# Patient Record
Sex: Female | Born: 1970
Health system: Southern US, Community
[De-identification: ages and names within clinical notes are randomized; demographics above are authoritative.]

## PROBLEM LIST (undated history)

## (undated) DIAGNOSIS — T7840XA Allergy, unspecified, initial encounter: Secondary | ICD-10-CM

## (undated) DIAGNOSIS — I10 Essential (primary) hypertension: Secondary | ICD-10-CM

## (undated) DIAGNOSIS — Z9889 Other specified postprocedural states: Secondary | ICD-10-CM

## (undated) DIAGNOSIS — Z87448 Personal history of other diseases of urinary system: Secondary | ICD-10-CM

## (undated) DIAGNOSIS — R7989 Other specified abnormal findings of blood chemistry: Secondary | ICD-10-CM

## (undated) DIAGNOSIS — F419 Anxiety disorder, unspecified: Secondary | ICD-10-CM

## (undated) DIAGNOSIS — R112 Nausea with vomiting, unspecified: Secondary | ICD-10-CM

## (undated) DIAGNOSIS — R109 Unspecified abdominal pain: Secondary | ICD-10-CM

## (undated) HISTORY — DX: Anxiety disorder, unspecified: F41.9

## (undated) HISTORY — PX: DILATION AND EVACUATION: SHX1459

## (undated) HISTORY — PX: TUBAL LIGATION: SHX77

## (undated) HISTORY — DX: Allergy, unspecified, initial encounter: T78.40XA

## (undated) HISTORY — PX: ABDOMINAL HYSTERECTOMY: SHX81

---
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
## 2008-11-15 ENCOUNTER — Emergency Department (HOSPITAL_COMMUNITY): Admission: EM | Admit: 2008-11-15 | Discharge: 2008-11-15 | Payer: Self-pay | Admitting: Family Medicine

## 2009-03-21 ENCOUNTER — Emergency Department (HOSPITAL_COMMUNITY): Admission: EM | Admit: 2009-03-21 | Discharge: 2009-03-21 | Payer: Self-pay | Admitting: Emergency Medicine

## 2009-07-20 ENCOUNTER — Emergency Department (HOSPITAL_COMMUNITY): Admission: EM | Admit: 2009-07-20 | Discharge: 2009-07-20 | Payer: Self-pay | Admitting: Emergency Medicine

## 2009-09-03 ENCOUNTER — Encounter: Admission: RE | Admit: 2009-09-03 | Discharge: 2009-09-03 | Payer: Self-pay | Admitting: Nephrology

## 2010-02-03 ENCOUNTER — Emergency Department (HOSPITAL_COMMUNITY): Admission: EM | Admit: 2010-02-03 | Discharge: 2010-02-03 | Payer: Self-pay | Admitting: Family Medicine

## 2010-08-12 LAB — CULTURE, ROUTINE-ABSCESS

## 2012-03-16 ENCOUNTER — Emergency Department (HOSPITAL_COMMUNITY): Payer: Self-pay

## 2012-03-16 ENCOUNTER — Encounter (HOSPITAL_COMMUNITY): Payer: Self-pay | Admitting: Emergency Medicine

## 2012-03-16 ENCOUNTER — Emergency Department (HOSPITAL_COMMUNITY)
Admission: EM | Admit: 2012-03-16 | Discharge: 2012-03-17 | Disposition: A | Discharge status: Home / Self Care | Payer: Self-pay | Attending: Emergency Medicine | Admitting: Emergency Medicine

## 2012-03-16 DIAGNOSIS — N949 Unspecified condition associated with female genital organs and menstrual cycle: Secondary | ICD-10-CM | POA: Insufficient documentation

## 2012-03-16 DIAGNOSIS — R102 Pelvic and perineal pain: Secondary | ICD-10-CM

## 2012-03-16 DIAGNOSIS — D259 Leiomyoma of uterus, unspecified: Secondary | ICD-10-CM | POA: Insufficient documentation

## 2012-03-16 DIAGNOSIS — I10 Essential (primary) hypertension: Secondary | ICD-10-CM | POA: Insufficient documentation

## 2012-03-16 DIAGNOSIS — D219 Benign neoplasm of connective and other soft tissue, unspecified: Secondary | ICD-10-CM

## 2012-03-16 HISTORY — DX: Essential (primary) hypertension: I10

## 2012-03-16 LAB — URINALYSIS, MICROSCOPIC ONLY
Hgb urine dipstick: NEGATIVE
Leukocytes, UA: NEGATIVE
Nitrite: NEGATIVE

## 2012-03-16 LAB — CBC WITH DIFFERENTIAL/PLATELET
Basophils Absolute: 0 10*3/uL (ref 0.0–0.1)
Eosinophils Absolute: 0.1 10*3/uL (ref 0.0–0.7)
Lymphocytes Relative: 26 % (ref 12–46)
Lymphs Abs: 2.4 10*3/uL (ref 0.7–4.0)
MCH: 29.2 pg (ref 26.0–34.0)
Monocytes Absolute: 0.6 10*3/uL (ref 0.1–1.0)
Monocytes Relative: 6 % (ref 3–12)
Neutro Abs: 6.2 10*3/uL (ref 1.7–7.7)
Neutrophils Relative %: 67 % (ref 43–77)
WBC: 9.2 10*3/uL (ref 4.0–10.5)

## 2012-03-16 LAB — COMPREHENSIVE METABOLIC PANEL
ALT: 15 U/L (ref 0–35)
AST: 18 U/L (ref 0–37)
Albumin: 3.5 g/dL (ref 3.5–5.2)
GFR calc Af Amer: 74 mL/min — ABNORMAL LOW (ref >90)
GFR calc non Af Amer: 64 mL/min — ABNORMAL LOW (ref >90)
Potassium: 3.6 mEq/L (ref 3.5–5.1)
Total Bilirubin: 0.2 mg/dL — ABNORMAL LOW (ref 0.3–1.2)

## 2012-03-16 LAB — LIPASE, BLOOD: Lipase: 38 U/L (ref 11–59)

## 2012-03-16 MED ORDER — HYDROMORPHONE HCL PF 1 MG/ML IJ SOLN
0.5000 mg | Freq: Once | INTRAMUSCULAR | Status: AC
  Administered 2012-03-17: 0.5 mg via INTRAVENOUS
  Filled 2012-03-16: qty 1

## 2012-03-16 MED ORDER — ONDANSETRON HCL 4 MG/2ML IJ SOLN
4.0000 mg | Freq: Once | INTRAMUSCULAR | Status: AC
  Administered 2012-03-17: 4 mg via INTRAVENOUS
  Filled 2012-03-16: qty 2

## 2012-03-16 MED ORDER — IOHEXOL 300 MG/ML  SOLN
20.0000 mL | INTRAMUSCULAR | Status: AC
  Administered 2012-03-16: 20 mL via ORAL

## 2012-03-16 NOTE — ED Notes (Signed)
Lab at bedside

## 2012-03-16 NOTE — ED Notes (Signed)
C/o R sided abd pain, nausea, and pressure with urination since this morning.  Pt states it feels like something is moving in R side of abd with intermittent swelling to R side of abd since this morning.  Last BM this AM- normal.

## 2012-03-16 NOTE — ED Notes (Signed)
Pt A.O. X 4. Complains of abdominal pain x 1 day. Tender on palpation in RUQ and RUL. Denies injury. Reports nausea x 1 day. No emesis. Denies recent diet or medication changes. Denies any other pain. NAD.

## 2012-03-16 NOTE — ED Provider Notes (Signed)
History     CSN: [id number]  Arrival date & time 03/16/12  4098   First MD Initiated Contact with Abigail Beasley 03/16/12 2103      Chief Complaint  Abigail Beasley presents with  . Abdominal Pain    (Consider location/radiation/quality/duration/timing/severity/associated sxs/prior treatment) Abigail Beasley is a 41 y.o. female presenting with abdominal pain. The history is provided by the Abigail Beasley.  Abdominal Pain The primary symptoms of the illness include abdominal pain, nausea, dysuria and vaginal discharge. The primary symptoms of the illness do not include fever, shortness of breath or vomiting. The current episode started 6 to 12 hours ago. The onset of the illness was gradual.  The vaginal discharge is associated with dysuria.   Associated symptoms comments: Right sided abdominal pain since this morning. She feels the area in the lower abdomen swells and the pain is waxing and waning. She has had nausea without vomiting. No known fever. She has had no change in her bowel movements. She reports a small amount of vaginal discharge. .    Past Medical History  Diagnosis Date  . Hypertension     History reviewed. No pertinent past surgical history.  History reviewed. No pertinent family history.  History  Substance Use Topics  . Smoking status: Never Smoker   . Smokeless tobacco: Not on file  . Alcohol Use: No    OB History    Grav Para Term Preterm Abortions TAB SAB Ect Mult Living                  Review of Systems  Constitutional: Negative for fever.  Respiratory: Negative for shortness of breath.   Cardiovascular: Negative for chest pain.  Gastrointestinal: Positive for nausea and abdominal pain. Negative for vomiting.  Genitourinary: Positive for dysuria and vaginal discharge.    Allergies  Review of Abigail Beasley's allergies indicates no known allergies.  Home Medications   Current Outpatient Rx  Name Route Sig Dispense Refill  . PRESCRIPTION MEDICATION Oral Take 1 tablet by  mouth daily. Blood pressure medication.      BP 151/89  Pulse 86  Temp 98.1 F (36.7 C) (Oral)  Resp 18  SpO2 97%  LMP 03/07/2012  Physical Exam  Constitutional: She is oriented to person, place, and time. She appears well-developed and well-nourished.  HENT:  Head: Normocephalic.  Neck: Normal range of motion. Neck supple.  Cardiovascular: Normal rate and regular rhythm.   Pulmonary/Chest: Effort normal and breath sounds normal.  Abdominal: Soft. Bowel sounds are normal. There is no rebound and no guarding.       RLQ abdominal tenderness. No rebound or guarding.   Genitourinary: No vaginal discharge found.       Mildly tender uterus and right adnexa without mass.   Musculoskeletal: Normal range of motion.  Neurological: She is alert and oriented to person, place, and time.  Skin: Skin is warm and dry. No rash noted.  Psychiatric: She has a normal mood and affect.    ED Course  Procedures (including critical care time)  Labs Reviewed  COMPREHENSIVE METABOLIC PANEL - Abnormal; Notable for the following:    Glucose, Bld 112 (*)     Total Bilirubin 0.2 (*)     GFR calc non Af Amer 64 (*)     GFR calc Af Amer 74 (*)     All other components within normal limits  URINALYSIS, MICROSCOPIC ONLY - Abnormal; Notable for the following:    APPearance CLOUDY (*)     Specific Gravity, Urine  1.035 (*)     Squamous Epithelial / LPF MANY (*)     All other components within normal limits  CBC WITH DIFFERENTIAL  LIPASE, BLOOD  POCT PREGNANCY, URINE   Results for orders placed during the hospital encounter of 03/16/12  CBC WITH DIFFERENTIAL      Component Value Range   WBC 9.2  4.0 - 10.5 K/uL   RBC 4.86  3.87 - 5.11 MIL/uL   Hemoglobin 14.2  12.0 - 15.0 g/dL   HCT 84.6  96.2 - 95.2 %   MCV 88.3  78.0 - 100.0 fL   MCH 29.2  26.0 - 34.0 pg   MCHC 33.1  30.0 - 36.0 g/dL   RDW 84.1  32.4 - 40.1 %   Platelets 269  150 - 400 K/uL   Neutrophils Relative 67  43 - 77 %   Neutro Abs  6.2  1.7 - 7.7 K/uL   Lymphocytes Relative 26  12 - 46 %   Lymphs Abs 2.4  0.7 - 4.0 K/uL   Monocytes Relative 6  3 - 12 %   Monocytes Absolute 0.6  0.1 - 1.0 K/uL   Eosinophils Relative 1  0 - 5 %   Eosinophils Absolute 0.1  0.0 - 0.7 K/uL   Basophils Relative 0  0 - 1 %   Basophils Absolute 0.0  0.0 - 0.1 K/uL  COMPREHENSIVE METABOLIC PANEL      Component Value Range   Sodium 141  135 - 145 mEq/L   Potassium 3.6  3.5 - 5.1 mEq/L   Chloride 104  96 - 112 mEq/L   CO2 27  19 - 32 mEq/L   Glucose, Bld 112 (*) 70 - 99 mg/dL   BUN 11  6 - 23 mg/dL   Creatinine, Ser 0.27  0.50 - 1.10 mg/dL   Calcium 9.7  8.4 - 25.3 mg/dL   Total Protein 7.5  6.0 - 8.3 g/dL   Albumin 3.5  3.5 - 5.2 g/dL   AST 18  0 - 37 U/L   ALT 15  0 - 35 U/L   Alkaline Phosphatase 78  39 - 117 U/L   Total Bilirubin 0.2 (*) 0.3 - 1.2 mg/dL   GFR calc non Af Amer 64 (*) >90 mL/min   GFR calc Af Amer 74 (*) >90 mL/min  LIPASE, BLOOD      Component Value Range   Lipase 38  11 - 59 U/L  URINALYSIS, MICROSCOPIC ONLY      Component Value Range   Color, Urine YELLOW  YELLOW   APPearance CLOUDY (*) CLEAR   Specific Gravity, Urine 1.035 (*) 1.005 - 1.030   pH 5.5  5.0 - 8.0   Glucose, UA NEGATIVE  NEGATIVE mg/dL   Hgb urine dipstick NEGATIVE  NEGATIVE   Bilirubin Urine NEGATIVE  NEGATIVE   Ketones, ur NEGATIVE  NEGATIVE mg/dL   Protein, ur NEGATIVE  NEGATIVE mg/dL   Urobilinogen, UA 1.0  0.0 - 1.0 mg/dL   Nitrite NEGATIVE  NEGATIVE   Leukocytes, UA NEGATIVE  NEGATIVE   WBC, UA 0-2  <3 WBC/hpf   RBC / HPF 0-2  <3 RBC/hpf   Bacteria, UA RARE  RARE   Squamous Epithelial / LPF MANY (*) RARE   Urine-Other MUCOUS PRESENT    POCT PREGNANCY, URINE      Component Value Range   Preg Test, Ur NEGATIVE  NEGATIVE   Ct Abdomen Pelvis W Contrast  03/17/2012  *RADIOLOGY REPORT*  Clinical Data: Right-sided abdominal pain, nausea  CT ABDOMEN AND PELVIS WITH CONTRAST  Technique:  Multidetector CT imaging of the abdomen and  pelvis was performed following the standard protocol during bolus administration of intravenous contrast.  Contrast: OMNIPAQUE IOHEXOL 300 MG/ML  SOLN  Comparison: None.  Findings: Mild linear scarring in the right lower lobe.  1.6 x 1.2 cm hypoenhancing lesion in the anterior liver (series 2/image 20), incompletely characterized, likely benign.  Spleen, pancreas, and adrenal glands are within normal limits.  Gallbladder is underdistended.  No intrahepatic or extrahepatic ductal dilatation.  Kidneys are within normal limits.  No hydronephrosis.  No evidence of bowel obstruction.  Normal appendix.  Uterus is heterogeneous and notable for a suspected fibroid in the anterior uterine body (sagittal image 90).  Bilateral ovaries are unremarkable.  Bladder is within normal limits.  Moderate fat-containing paraumbilical hernia.  Small fat-containing left inguinal hernia.  Visualized osseous structures are within normal limits.  IMPRESSION: No evidence of bowel obstruction.  Normal appendix.  Uterine fibroid.  No CT findings to account for the Abigail Beasley's abdominal pain.   Original Report Authenticated By: Charline Bills, M.D.    No results found.   No diagnosis found.  1. Pelvic pain 2. Uterine fibroids   MDM  CT scan negative for appendix, but positive for uterine fibroids. No fever, vomiting, diarrhea or other evidence of infection. Likely discomfort is due to fibroids. Will discharge home with GYN follow up.        Rodena Medin, PA-C 03/17/12 0110

## 2012-03-17 LAB — WET PREP, GENITAL
Trich, Wet Prep: NONE SEEN
WBC, Wet Prep HPF POC: NONE SEEN
Yeast Wet Prep HPF POC: NONE SEEN

## 2012-03-17 MED ORDER — HYDROCODONE-ACETAMINOPHEN 5-325 MG PO TABS: 1.0000 | ORAL_TABLET | ORAL | Status: DC | PRN

## 2012-03-17 MED ORDER — IBUPROFEN 800 MG PO TABS: 800.0000 mg | ORAL_TABLET | Freq: Three times a day (TID) | ORAL | Status: DC

## 2012-03-17 MED ORDER — OXYCODONE-ACETAMINOPHEN 5-325 MG PO TABS
1.0000 | ORAL_TABLET | Freq: Once | ORAL | Status: AC
  Administered 2012-03-17: 1 via ORAL
  Filled 2012-03-17: qty 1

## 2012-03-17 MED ORDER — IOHEXOL 300 MG/ML  SOLN
100.0000 mL | Freq: Once | INTRAMUSCULAR | Status: AC | PRN
  Administered 2012-03-17: 100 mL via INTRAVENOUS

## 2012-03-17 MED ORDER — PROMETHAZINE HCL 25 MG PO TABS: 25.0000 mg | ORAL_TABLET | Freq: Four times a day (QID) | ORAL | Status: DC | PRN

## 2012-03-17 NOTE — ED Notes (Signed)
Pt A.O. X 4. NAD. Vitals stable. Verbalized understanding of medication administration. Verbalized need to follow up with [facility] outpatient clinic. No further questions at this time.

## 2012-03-17 NOTE — ED Provider Notes (Signed)
Medical screening examination/treatment/procedure(s) were conducted as a shared visit with non-physician practitioner(s) and myself.  I personally evaluated the patient during the encounter  Patient is feeling better at this time.  Discharge home in good condition.  Nonspecific abdominal pain and nausea.  CT scan normal.  No tenderness in the right upper quadrant.  Pelvic per PA unremarkable.  Wet prep unremarkable  Close PCP followup   I personally reviewed the imaging tests through PACS system  I reviewed available ER/hospitalization records thought the EMR Ct Abdomen Pelvis W Contrast  03/17/2012  *RADIOLOGY REPORT*  Clinical Data: Right-sided abdominal pain, nausea  CT ABDOMEN AND PELVIS WITH CONTRAST  Technique:  Multidetector CT imaging of the abdomen and pelvis was performed following the standard protocol during bolus administration of intravenous contrast.  Contrast: OMNIPAQUE IOHEXOL 300 MG/ML  SOLN  Comparison: None.  Findings: Mild linear scarring in the right lower lobe.  1.6 x 1.2 cm hypoenhancing lesion in the anterior liver (series 2/image 20), incompletely characterized, likely benign.  Spleen, pancreas, and adrenal glands are within normal limits.  Gallbladder is underdistended.  No intrahepatic or extrahepatic ductal dilatation.  Kidneys are within normal limits.  No hydronephrosis.  No evidence of bowel obstruction.  Normal appendix.  Uterus is heterogeneous and notable for a suspected fibroid in the anterior uterine body (sagittal image 90).  Bilateral ovaries are unremarkable.  Bladder is within normal limits.  Moderate fat-containing paraumbilical hernia.  Small fat-containing left inguinal hernia.  Visualized osseous structures are within normal limits.  IMPRESSION: No evidence of bowel obstruction.  Normal appendix.  Uterine fibroid.  No CT findings to account for the patient's abdominal pain.   Original Report Authenticated By: Charline Bills, M.D.     Lyanne Co,  MD 03/17/12 [phone]

## 2012-03-19 LAB — GC/CHLAMYDIA PROBE AMP, GENITAL: Chlamydia, DNA Probe: NEGATIVE

## 2013-01-04 ENCOUNTER — Encounter (HOSPITAL_COMMUNITY): Payer: Self-pay | Admitting: Cardiology

## 2013-01-04 ENCOUNTER — Emergency Department (HOSPITAL_COMMUNITY)
Admission: EM | Admit: 2013-01-04 | Discharge: 2013-01-04 | Disposition: A | Discharge status: Home / Self Care | Payer: Self-pay | Attending: Emergency Medicine | Admitting: Emergency Medicine

## 2013-01-04 ENCOUNTER — Emergency Department (HOSPITAL_COMMUNITY): Payer: Self-pay

## 2013-01-04 DIAGNOSIS — M25559 Pain in unspecified hip: Secondary | ICD-10-CM | POA: Insufficient documentation

## 2013-01-04 DIAGNOSIS — M25552 Pain in left hip: Secondary | ICD-10-CM

## 2013-01-04 DIAGNOSIS — Z79899 Other long term (current) drug therapy: Secondary | ICD-10-CM | POA: Insufficient documentation

## 2013-01-04 DIAGNOSIS — R002 Palpitations: Secondary | ICD-10-CM | POA: Insufficient documentation

## 2013-01-04 DIAGNOSIS — R42 Dizziness and giddiness: Secondary | ICD-10-CM | POA: Insufficient documentation

## 2013-01-04 DIAGNOSIS — R52 Pain, unspecified: Secondary | ICD-10-CM | POA: Insufficient documentation

## 2013-01-04 DIAGNOSIS — R110 Nausea: Secondary | ICD-10-CM | POA: Insufficient documentation

## 2013-01-04 DIAGNOSIS — I10 Essential (primary) hypertension: Secondary | ICD-10-CM | POA: Insufficient documentation

## 2013-01-04 LAB — BASIC METABOLIC PANEL
CO2: 25 mEq/L (ref 19–32)
GFR calc Af Amer: 85 mL/min — ABNORMAL LOW (ref >90)
Glucose, Bld: 106 mg/dL — ABNORMAL HIGH (ref 70–99)
Sodium: 138 mEq/L (ref 135–145)

## 2013-01-04 LAB — CBC WITH DIFFERENTIAL/PLATELET
Basophils Absolute: 0 10*3/uL (ref 0.0–0.1)
Basophils Relative: 1 % (ref 0–1)
Eosinophils Absolute: 0.1 10*3/uL (ref 0.0–0.7)
Eosinophils Relative: 1 % (ref 0–5)
Hemoglobin: 13.1 g/dL (ref 12.0–15.0)
Lymphs Abs: 1.9 10*3/uL (ref 0.7–4.0)
MCHC: 33.2 g/dL (ref 30.0–36.0)
Monocytes Absolute: 0.5 10*3/uL (ref 0.1–1.0)
Monocytes Relative: 7 % (ref 3–12)
Platelets: 256 10*3/uL (ref 150–400)
RDW: 12.6 % (ref 11.5–15.5)

## 2013-01-04 MED ORDER — MECLIZINE HCL 12.5 MG PO TABS: 12.5000 mg | ORAL_TABLET | Freq: Three times a day (TID) | ORAL | Status: DC | PRN

## 2013-01-04 MED ORDER — PREDNISONE 10 MG PO TABS: 20.0000 mg | ORAL_TABLET | Freq: Two times a day (BID) | ORAL | Status: DC

## 2013-01-04 MED ORDER — MECLIZINE HCL 25 MG PO TABS
25.0000 mg | ORAL_TABLET | Freq: Once | ORAL | Status: AC
  Administered 2013-01-04: 25 mg via ORAL
  Filled 2013-01-04: qty 1

## 2013-01-04 NOTE — ED Provider Notes (Signed)
CSN: [id number]     Arrival date & time 01/04/13  7829 History     First MD Initiated Contact with Patient 01/04/13 0932     Chief Complaint  Patient presents with  . Dizziness  . Hip Pain   (Consider location/radiation/quality/duration/timing/severity/associated sxs/prior Treatment) HPI 42 y o b f with no sigif PMH. Presented with a 3 month hx of Lt hip pain- that now radiates down to the side of her thigh, no hx of trauma or falls, no backpain, no pain in any other joint, no fever, no abnormal sensations in lower extremity. Also c/o dizziness for the past 2 weeks, worse in the past 2 days, reports its like the room spinning around her, last for about 5 s, and has sevearl episodes a day. Relieved by sitting down, no known provoking factors, unrelated to position. Sometimes has palpitations and feels nauseous, but no vomiting, also complains of some buzzing in her both ears during the episodes of dizziness but no feeling of ear fullness. No change in vision, no chest pains, no SOB,  . The only medication she is on- Lisinopril for HTN, since 2001.   Past Medical History  Diagnosis Date  . Hypertension    History reviewed. No pertinent past surgical history. History reviewed. No pertinent family history. History  Substance Use Topics  . Smoking status: Never Smoker   . Smokeless tobacco: Not on file  . Alcohol Use: No   OB History   Grav Para Term Preterm Abortions TAB SAB Ect Mult Living                 Review of Systems  No pertinent finding on ROS.  Allergies  Review of patient's allergies indicates no known allergies.  Home Medications   Current Outpatient Rx  Name  Route  Sig  Dispense  Refill  . ibuprofen (ADVIL,MOTRIN) 200 MG tablet   Oral   Take 400 mg by mouth every 8 (eight) hours as needed for pain (in hip).         Marland Kitchen lisinopril-hydrochlorothiazide (PRINZIDE,ZESTORETIC) 20-12.5 MG per tablet   Oral   Take 1 tablet by mouth daily.         . Multiple  Vitamin (MULTIVITAMIN WITH MINERALS) TABS tablet   Oral   Take 1 tablet by mouth daily. One a day         . meclizine (ANTIVERT) 12.5 MG tablet   Oral   Take 1 tablet (12.5 mg total) by mouth 3 (three) times daily as needed.   15 tablet   0   . predniSONE (DELTASONE) 10 MG tablet   Oral   Take 2 tablets (20 mg total) by mouth 2 (two) times daily.   20 tablet   0    BP 132/83  Pulse 67  Temp(Src) 98.5 F (36.9 C) (Oral)  Resp 17  SpO2 98% Physical Exam Gen- Alert, co-op, not in any obvious distress. HEENT- normocephalic atraumatic, PERRL, EOMI, Ears- tymp membr-appears normal, torus palate present on mouth exam, oral mucosa-moist. Cardiac- RRR, no murmurs, rubs or gallops. Resp- Clear to auscultation Bilat. Abd- soft, moves with respiration. Neuro- OTPP, Cr N - 2-12 intact. Extr- 2 + lower extr pulses, Nomal strenght in all extremities, Nl range of motion in lower extremities.   ED Course   Procedures (including critical care time)  Labs Reviewed  BASIC METABOLIC PANEL - Abnormal; Notable for the following:    Glucose, Bld 106 (*)    GFR calc non  Af Amer 73 (*)    GFR calc Af Amer 85 (*)    All other components within normal limits  CBC WITH DIFFERENTIAL   Dg Hip Complete Left  01/04/2013   *RADIOLOGY REPORT*  Clinical Data: Left hip pain  LEFT HIP - COMPLETE 2+ VIEW  Comparison: None.  Findings: Frontal and oblique projections of the left hip were obtained and reveal no evidence of acute fracture or dislocation. Very minimal degenerative changes are seen.  No soft tissue abnormality is noted.  IMPRESSION: No acute abnormalities seen.   Original Report Authenticated By: Alcide Clever, M.D.   Dizziness. Hip pain.  MDM  Dizziness- Possibly Benign paroxysmal positional vertigo, considering very short episodes of dizziness and lack of other neurologic findings. Other less likely aetiology- Menieres Dx and labyrintitis. - Meclizine- 25mg  stat - CBC - BMp. - EKG- 66bpm,  RRR, normal PR interval.No ST abnormalities.  - D/c home on Meclizine-12.5mg  TID and Prednisone- 10mg  BID for 5 days.  - Pt counseled on need for f/u with PCP, pt verbalized understanding. Hip Pain, radiating down side of thigh: - 2DG left hip xray.-Very minimal degenerative changes are seen. No soft tissue abnormality is noted. IMPRESSION: No acute abnormalities seen. - Likely due to meralgia paraesthetica.  Kennis Carina, MD 01/04/13 2037

## 2013-01-04 NOTE — ED Notes (Signed)
Pt reports intermittent episodes of dizziness for the past couple of weeks. States that she feels like the room is spinning, and she has had a headache. Reports some blurred vision. Denies any chest pain or SOB. Skin warm and dry. Also report some left hip pain.

## 2013-01-05 NOTE — ED Provider Notes (Signed)
I saw and evaluated the patient, reviewed the resident's note and I agree with the findings and plan. The patient presents with complaints of dizziness for the past two weeks, and pain in the left hip for the past several months.  She denies any injury or trauma.  The dizziness is worse with position and movement and relieved with rest.  No headaches.    On exam, the patient is afebrile and the vitals are stable.  The heart is regular rate and rhythm and the lungs are clear.  The abdomen is soft, non-tender without masses.  The extremities are without edema.  The neurologic exam reveals cranial nerves that are intact and the strength is equal and 5/5 in all extremities.  Her symptoms are reproduced with turning the head.  The left hip appears grossly normal.  The distal pulses are intact.  The patient presents with dizziness that appears to be vertiginous in nature, and hip pain that appears musculoskeletal in nature.  The xrays are negative and the labs and ekg are unremarkable.  She is feeling somewhat better with meclizine and will discharge with prednisone for the hip and meclizine for her vertigo.   Date: 01/05/2013  Rate: 60-70  Rhythm: normal sinus rhythm  QRS Axis: normal  Intervals: normal  ST/T Wave abnormalities: normal  Conduction Disutrbances:none  Narrative Interpretation:   Old EKG Reviewed: none available    Geoffery Lyons, MD 01/05/13 1219

## 2013-02-17 ENCOUNTER — Encounter (HOSPITAL_COMMUNITY): Payer: Self-pay | Admitting: *Deleted

## 2013-02-17 ENCOUNTER — Emergency Department (HOSPITAL_COMMUNITY)
Admission: EM | Admit: 2013-02-17 | Discharge: 2013-02-17 | Disposition: A | Discharge status: Home / Self Care | Payer: Self-pay | Attending: Emergency Medicine | Admitting: Emergency Medicine

## 2013-02-17 DIAGNOSIS — Y92009 Unspecified place in unspecified non-institutional (private) residence as the place of occurrence of the external cause: Secondary | ICD-10-CM | POA: Insufficient documentation

## 2013-02-17 DIAGNOSIS — Y9389 Activity, other specified: Secondary | ICD-10-CM | POA: Insufficient documentation

## 2013-02-17 DIAGNOSIS — Z79899 Other long term (current) drug therapy: Secondary | ICD-10-CM | POA: Insufficient documentation

## 2013-02-17 DIAGNOSIS — S39012A Strain of muscle, fascia and tendon of lower back, initial encounter: Secondary | ICD-10-CM

## 2013-02-17 DIAGNOSIS — I10 Essential (primary) hypertension: Secondary | ICD-10-CM | POA: Insufficient documentation

## 2013-02-17 DIAGNOSIS — X500XXA Overexertion from strenuous movement or load, initial encounter: Secondary | ICD-10-CM | POA: Insufficient documentation

## 2013-02-17 DIAGNOSIS — S335XXA Sprain of ligaments of lumbar spine, initial encounter: Secondary | ICD-10-CM | POA: Insufficient documentation

## 2013-02-17 MED ORDER — KETOROLAC TROMETHAMINE 60 MG/2ML IM SOLN
60.0000 mg | Freq: Once | INTRAMUSCULAR | Status: AC
  Administered 2013-02-17: 60 mg via INTRAMUSCULAR
  Filled 2013-02-17: qty 2

## 2013-02-17 MED ORDER — DIAZEPAM 5 MG PO TABS
5.0000 mg | ORAL_TABLET | Freq: Once | ORAL | Status: AC
  Administered 2013-02-17: 5 mg via ORAL
  Filled 2013-02-17: qty 1

## 2013-02-17 MED ORDER — DIAZEPAM 5 MG PO TABS: 5.0000 mg | ORAL_TABLET | Freq: Two times a day (BID) | ORAL | Status: DC

## 2013-02-17 NOTE — ED Notes (Signed)
Received report from off going RN

## 2013-02-17 NOTE — ED Provider Notes (Signed)
CSN: [id number]     Arrival date & time 02/17/13  1726 History   First MD Initiated Contact with Patient 02/17/13 1744     Chief Complaint  Patient presents with  . Back Pain   (Consider location/radiation/quality/duration/timing/severity/associated sxs/prior Treatment) Patient is a 42 y.o. female presenting with back pain. No language interpreter was used.  Back Pain Location:  Lumbar spine Quality:  Aching and cramping Radiates to:  Does not radiate Pain severity:  Moderate Onset quality:  Gradual Duration:  3 days Context: lifting heavy objects   Relieved by:  Nothing Ineffective treatments:  Ibuprofen Associated symptoms: no dysuria, no numbness, no paresthesias, no tingling and no weakness    Pt is a 42 year old female who presents with lower back pain. She reports that she went to pick up a large container out of the closet on Thursday and felt something "pop". She reports that she has been moving around at home, close to her normal activity level but has difficulty bending over to tie her shoes or to put pants on. She denies any urinary symptoms, kidney stones or recent illness. She denies numbness, tingling or paresthesia. She ambulates without difficulty.  Past Medical History  Diagnosis Date  . Hypertension    History reviewed. No pertinent past surgical history. No family history on file. History  Substance Use Topics  . Smoking status: Never Smoker   . Smokeless tobacco: Not on file  . Alcohol Use: No   OB History   Grav Para Term Preterm Abortions TAB SAB Ect Mult Living                 Review of Systems  Genitourinary: Negative for dysuria.  Musculoskeletal: Positive for back pain. Negative for gait problem.  Neurological: Negative for tingling, weakness, numbness and paresthesias.  All other systems reviewed and are negative.    Allergies  Review of patient's allergies indicates no known allergies.  Home Medications   Current Outpatient Rx  Name   Route  Sig  Dispense  Refill  . ibuprofen (ADVIL,MOTRIN) 200 MG tablet   Oral   Take 800 mg by mouth every 8 (eight) hours as needed for pain (for back pain).          Marland Kitchen lisinopril-hydrochlorothiazide (PRINZIDE,ZESTORETIC) 20-12.5 MG per tablet   Oral   Take 1 tablet by mouth daily.         . Multiple Vitamin (MULTIVITAMIN WITH MINERALS) TABS tablet   Oral   Take 1 tablet by mouth daily. One a day         . PRESCRIPTION MEDICATION   Oral   Take 1 tablet by mouth once. Unknown medication (white pill) thought to be a muscle relaxer.  Patient suggested Trazodone as the possible name.          BP 118/69  Pulse 93  Temp(Src) 99.1 F (37.3 C)  Resp 18  SpO2 100%  LMP 02/17/2013 Physical Exam  Nursing note and vitals reviewed. Constitutional: She is oriented to person, place, and time. She appears well-developed and well-nourished. No distress.  HENT:  Head: Normocephalic and atraumatic.  Eyes: Conjunctivae are normal. Pupils are equal, round, and reactive to light.  Neck: Normal range of motion.  Cardiovascular: Normal rate, regular rhythm, normal heart sounds and intact distal pulses.   Pulmonary/Chest: Effort normal and breath sounds normal.  Musculoskeletal: Normal range of motion.       Lumbar back: She exhibits pain and spasm. She exhibits no swelling  and no edema.       Back:  Lower, left paravertebral muscle spasm. Denies numbness, tingling, paresthesia. Good ROM, but difficulty bending forward due to pain. Ambulates without difficulty. Feels better in sitting position.  Neurological: She is alert and oriented to person, place, and time.  Skin: Skin is dry.  Psychiatric: She has a normal mood and affect. Her behavior is normal. Judgment and thought content normal.    ED Course  Procedures (including critical care time) Labs Review Labs Reviewed - No data to display Imaging Review No results found.  MDM   1. Low back strain, initial encounter    Left  lower back pain, ambulates without difficulty. No numbness or tingling in extremities. Relief after valium and toradol here in ER. Denies any urinary symptoms or radiation of pain. Musculoskeletal, lower left paravertebral muscle strain.     Irish Elders, NP 02/17/13 1947

## 2013-02-17 NOTE — ED Notes (Signed)
Thursday when she reached up to get  Something from a closet when she heard a pop.  Lower back pain since then.

## 2013-02-20 NOTE — ED Provider Notes (Signed)
Medical screening examination/treatment/procedure(s) were performed by non-physician practitioner and as supervising physician I was immediately available for consultation/collaboration.    Christopher J. Pollina, MD 02/20/13 0432 

## 2013-02-23 ENCOUNTER — Encounter (HOSPITAL_COMMUNITY): Payer: Self-pay | Admitting: *Deleted

## 2013-02-23 ENCOUNTER — Emergency Department (HOSPITAL_COMMUNITY)
Admission: EM | Admit: 2013-02-23 | Discharge: 2013-02-23 | Disposition: A | Discharge status: Home / Self Care | Payer: Self-pay | Attending: Emergency Medicine | Admitting: Emergency Medicine

## 2013-02-23 DIAGNOSIS — X500XXA Overexertion from strenuous movement or load, initial encounter: Secondary | ICD-10-CM | POA: Insufficient documentation

## 2013-02-23 DIAGNOSIS — Y92009 Unspecified place in unspecified non-institutional (private) residence as the place of occurrence of the external cause: Secondary | ICD-10-CM | POA: Insufficient documentation

## 2013-02-23 DIAGNOSIS — M5380 Other specified dorsopathies, site unspecified: Secondary | ICD-10-CM | POA: Insufficient documentation

## 2013-02-23 DIAGNOSIS — Z79899 Other long term (current) drug therapy: Secondary | ICD-10-CM | POA: Insufficient documentation

## 2013-02-23 DIAGNOSIS — M62830 Muscle spasm of back: Secondary | ICD-10-CM

## 2013-02-23 DIAGNOSIS — Y9389 Activity, other specified: Secondary | ICD-10-CM | POA: Insufficient documentation

## 2013-02-23 DIAGNOSIS — I10 Essential (primary) hypertension: Secondary | ICD-10-CM | POA: Insufficient documentation

## 2013-02-23 MED ORDER — DIAZEPAM 5 MG PO TABS: 5.0000 mg | ORAL_TABLET | Freq: Two times a day (BID) | ORAL | Status: DC

## 2013-02-23 MED ORDER — IBUPROFEN 800 MG PO TABS: 800.0000 mg | ORAL_TABLET | Freq: Three times a day (TID) | ORAL | Status: DC

## 2013-02-23 MED ORDER — OXYCODONE-ACETAMINOPHEN 5-325 MG PO TABS
2.0000 | ORAL_TABLET | Freq: Once | ORAL | Status: AC
  Administered 2013-02-23: 2 via ORAL
  Filled 2013-02-23: qty 2

## 2013-02-23 MED ORDER — OXYCODONE-ACETAMINOPHEN 5-325 MG PO TABS: 1.0000 | ORAL_TABLET | ORAL | Status: DC | PRN

## 2013-02-23 MED ORDER — DEXAMETHASONE SODIUM PHOSPHATE 10 MG/ML IJ SOLN
10.0000 mg | Freq: Once | INTRAMUSCULAR | Status: AC
  Administered 2013-02-23: 10 mg via INTRAMUSCULAR
  Filled 2013-02-23: qty 1

## 2013-02-23 NOTE — ED Provider Notes (Signed)
CSN: [id number]     Arrival date & time 02/23/13  1259 History  This chart was scribed for non-physician practitioner, Arthor Captain, PA-C, working with Flint Melter, MD by Shari Heritage, ED Scribe. This patient was seen in room TR09C/TR09C and the patient's care was started at 1:35 PM.   Chief Complaint  Patient presents with  . Back Pain     The history is provided by the patient. No language interpreter was used.    HPI Comments: Abigail Beasley is a 42 y.o. female who presents to the Emergency Department complaining of constant, moderate left lower back pain onset several hours ago. Pain is worse with flexion of her back and is relieved mildly when she sits up straight. She states that last Thursday she was pulling soemthing out of her closet when she strained her back. She was seen here on 02/17/13 for back pain and was given a prescription for Valium which provided relief. She states that she re-injured her back while cleaning and mowing her yawn today so she has returned for treatment. She denies fever, chills, urinary incontinence, bowel incontinence, extremity weakness, extremity numbness or other symtpoms. She denies history of nephrolithiasis. She has a medical history of HTN. She does not use IV drugs. Patient does not smoke.  Past Medical History  Diagnosis Date  . Hypertension    History reviewed. No pertinent past surgical history. History reviewed. No pertinent family history. History  Substance Use Topics  . Smoking status: Never Smoker   . Smokeless tobacco: Not on file  . Alcohol Use: No    OB History   Grav Para Term Preterm Abortions TAB SAB Ect Mult Living                 Review of Systems A complete 10 system review of systems was obtained and all systems are negative except as noted in the HPI and PMH.   Allergies  Review of patient's allergies indicates no known allergies.  Home Medications   Current Outpatient Rx  Name  Route  Sig  Dispense  Refill  .  diazepam (VALIUM) 5 MG tablet   Oral   Take 5 mg by mouth every 12 (twelve) hours as needed (for muscle relaxer).         Marland Kitchen lisinopril-hydrochlorothiazide (PRINZIDE,ZESTORETIC) 20-12.5 MG per tablet   Oral   Take 1 tablet by mouth daily.         . Multiple Vitamin (MULTIVITAMIN WITH MINERALS) TABS tablet   Oral   Take 1 tablet by mouth daily.           Triage Vitals: BP 110/72  Pulse 108  Temp(Src) 97.8 F (36.6 C) (Oral)  Resp 18  SpO2 96%  LMP 02/17/2013 Physical Exam  Nursing note and vitals reviewed. Constitutional: She is oriented to person, place, and time. She appears well-developed and well-nourished. No distress.  HENT:  Head: Normocephalic and atraumatic.  Eyes: EOM are normal.  Neck: Neck supple. No tracheal deviation present.  Cardiovascular: Normal rate.   Pulmonary/Chest: Effort normal. No respiratory distress.  Musculoskeletal: Normal range of motion.       Thoracic back: She exhibits spasm.       Lumbar back: She exhibits spasm.  Thoracolumbar spasm.  Neurological: She is alert and oriented to person, place, and time.  Skin: Skin is warm and dry.  Psychiatric: She has a normal mood and affect. Her behavior is normal.    ED Course  Procedures (including  critical care time) DIAGNOSTIC STUDIES: Oxygen Saturation is 96% on room air, normal by my interpretation.    COORDINATION OF CARE: 1:43 PM- Will order Decadron and prescribe diazepam. Patient informed of current plan for treatment and evaluation and agrees with plan at this time.    Labs Review Labs Reviewed - No data to display  Imaging Review No results found.  MDM   1. Muscle spasm of back    Patient with back pain.  No neurological deficits and normal neuro exam.  Patient can walk but states is painful.  No loss of bowel or bladder control.  No concern for cauda equina.  No fever, night sweats, weight loss, h/o cancer, IVDU.  RICE protocol and pain medicine indicated and discussed with  patient.   I personally performed the services described in this documentation, which was scribed in my presence. The recorded information has been reviewed and is accurate.     Arthor Captain, PA-C 02/23/13 1355

## 2013-02-23 NOTE — ED Provider Notes (Signed)
Medical screening examination/treatment/procedure(s) were performed by non-physician practitioner and as supervising physician I was immediately available for consultation/collaboration.  Alaine Loughney L Christena Sunderlin, MD 02/23/13 1705 

## 2013-02-23 NOTE — ED Notes (Signed)
Pt reports being here last week for back pain, given meds and valium inj and felt ok all week. Pt was cleaning and mowing yard today and felt a "pop" in her back and now having lower left back pain. Ambulatory at triage.

## 2013-03-04 ENCOUNTER — Ambulatory Visit: Payer: Self-pay

## 2013-03-15 ENCOUNTER — Ambulatory Visit: Payer: Self-pay | Attending: Internal Medicine

## 2013-03-18 ENCOUNTER — Encounter: Payer: Self-pay | Admitting: Internal Medicine

## 2013-03-18 ENCOUNTER — Ambulatory Visit: Payer: Self-pay | Attending: Internal Medicine | Admitting: Internal Medicine

## 2013-03-18 VITALS — BP 126/82 | HR 79 | Temp 98.4°F | Resp 16 | Ht 68.5 in | Wt 285.0 lb

## 2013-03-18 DIAGNOSIS — N644 Mastodynia: Secondary | ICD-10-CM

## 2013-03-18 DIAGNOSIS — R42 Dizziness and giddiness: Secondary | ICD-10-CM | POA: Insufficient documentation

## 2013-03-18 DIAGNOSIS — I10 Essential (primary) hypertension: Secondary | ICD-10-CM | POA: Insufficient documentation

## 2013-03-18 LAB — CBC WITH DIFFERENTIAL/PLATELET
Basophils Absolute: 0 10*3/uL (ref 0.0–0.1)
Basophils Relative: 0 % (ref 0–1)
MCHC: 34.3 g/dL (ref 30.0–36.0)
Monocytes Absolute: 0.4 10*3/uL (ref 0.1–1.0)
Neutro Abs: 4.7 10*3/uL (ref 1.7–7.7)
Neutrophils Relative %: 64 % (ref 43–77)
Platelets: 314 10*3/uL (ref 150–400)
RDW: 14.1 % (ref 11.5–15.5)

## 2013-03-18 LAB — LIPID PANEL
HDL: 49 mg/dL (ref >39)
LDL Cholesterol: 115 mg/dL — ABNORMAL HIGH (ref 0–99)
Triglycerides: 136 mg/dL (ref <150)

## 2013-03-18 LAB — CMP AND LIVER
ALT: 14 U/L (ref 0–35)
Bilirubin, Direct: 0.1 mg/dL (ref 0.0–0.3)
CO2: 24 mEq/L (ref 19–32)
Calcium: 10.1 mg/dL (ref 8.4–10.5)
Chloride: 107 mEq/L (ref 96–112)
Creat: 1.01 mg/dL (ref 0.50–1.10)
Glucose, Bld: 80 mg/dL (ref 70–99)
Indirect Bilirubin: 0.3 mg/dL (ref 0.0–0.9)
Sodium: 140 mEq/L (ref 135–145)
Total Bilirubin: 0.4 mg/dL (ref 0.3–1.2)
Total Protein: 7.3 g/dL (ref 6.0–8.3)

## 2013-03-18 MED ORDER — LISINOPRIL-HYDROCHLOROTHIAZIDE 20-12.5 MG PO TABS: 1.0000 | ORAL_TABLET | Freq: Every day | ORAL | Status: DC

## 2013-03-18 MED ORDER — MECLIZINE HCL 25 MG PO TABS: 25.0000 mg | ORAL_TABLET | Freq: Three times a day (TID) | ORAL | Status: DC | PRN

## 2013-03-18 NOTE — Progress Notes (Signed)
Pt is here today to establish care. Pt has been having dizzy spells for three months. For three months pt has pain in her hip. Sharp, numb and tingling. She has a cyst on her abdomen. Pt reports that she has had pain in her left breast for two years.

## 2013-03-18 NOTE — Patient Instructions (Addendum)
Benign Positional Vertigo Vertigo means you feel like you or your surroundings are moving when they are not. Benign positional vertigo is the most common form of vertigo. Benign means that the cause of your condition is not serious. Benign positional vertigo is more common in older adults. CAUSES  Benign positional vertigo is the result of an upset in the labyrinth system. This is an area in the middle ear that helps control your balance. This may be caused by a viral infection, head injury, or repetitive motion. However, often no specific cause is found. SYMPTOMS  Symptoms of benign positional vertigo occur when you move your head or eyes in different directions. Some of the symptoms may include:  Loss of balance and falls.  Vomiting.  Blurred vision.  Dizziness.  Nausea.  Involuntary eye movements (nystagmus). DIAGNOSIS  Benign positional vertigo is usually diagnosed by physical exam. If the specific cause of your benign positional vertigo is unknown, your caregiver may perform imaging tests, such as magnetic resonance imaging (MRI) or computed tomography (CT). TREATMENT  Your caregiver may recommend movements or procedures to correct the benign positional vertigo. Medicines such as meclizine, benzodiazepines, and medicines for nausea may be used to treat your symptoms. In rare cases, if your symptoms are caused by certain conditions that affect the inner ear, you may need surgery. HOME CARE INSTRUCTIONS   Follow your caregiver's instructions.  Move slowly. Do not make sudden body or head movements.  Avoid driving.  Avoid operating heavy machinery.  Avoid performing any tasks that would be dangerous to you or others during a vertigo episode.  Drink enough fluids to keep your urine clear or pale yellow. SEEK IMMEDIATE MEDICAL CARE IF:   You develop problems with walking, weakness, numbness, or using your arms, hands, or legs.  You have difficulty speaking.  You develop  severe headaches.  Your nausea or vomiting continues or gets worse.  You develop visual changes.  Your family or friends notice any behavioral changes.  Your condition gets worse.  You have a fever.  You develop a stiff neck or sensitivity to light. MAKE SURE YOU:   Understand these instructions.  Will watch your condition.  Will get help right away if you are not doing well or get worse. Document Released: 02/21/2006 Document Revised: 08/08/2011 Document Reviewed: 02/03/2011 [facility] Patient Information 2014 [place], [place]. Hypertension As your heart beats, it forces blood through your arteries. This force is your blood pressure. If the pressure is too high, it is called hypertension (HTN) or high blood pressure. HTN is dangerous because you may have it and not know it. High blood pressure may mean that your heart has to work harder to pump blood. Your arteries may be narrow or stiff. The extra work puts you at risk for heart disease, stroke, and other problems.  Blood pressure consists of two numbers, a higher number over a lower, 110/72, for example. It is stated as "110 over 72." The ideal is below 120 for the top number (systolic) and under 80 for the bottom (diastolic). Write down your blood pressure today. You should pay close attention to your blood pressure if you have certain conditions such as:  Heart failure.  Prior heart attack.  Diabetes  Chronic kidney disease.  Prior stroke.  Multiple risk factors for heart disease. To see if you have HTN, your blood pressure should be measured while you are seated with your arm held at the level of the heart. It should be measured at  least twice. A one-time elevated blood pressure reading (especially in the Emergency Department) does not mean that you need treatment. There may be conditions in which the blood pressure is different between your right and left arms. It is important to see your caregiver soon for a recheck. Most  people have essential hypertension which means that there is not a specific cause. This type of high blood pressure may be lowered by changing lifestyle factors such as:  Stress.  Smoking.  Lack of exercise.  Excessive weight.  Drug/tobacco/alcohol use.  Eating less salt. Most people do not have symptoms from high blood pressure until it has caused damage to the body. Effective treatment can often prevent, delay or reduce that damage. TREATMENT  When a cause has been identified, treatment for high blood pressure is directed at the cause. There are a large number of medications to treat HTN. These fall into several categories, and your caregiver will help you select the medicines that are best for you. Medications may have side effects. You should review side effects with your caregiver. If your blood pressure stays high after you have made lifestyle changes or started on medicines,   Your medication(s) may need to be changed.  Other problems may need to be addressed.  Be certain you understand your prescriptions, and know how and when to take your medicine.  Be sure to follow up with your caregiver within the time frame advised (usually within two weeks) to have your blood pressure rechecked and to review your medications.  If you are taking more than one medicine to lower your blood pressure, make sure you know how and at what times they should be taken. Taking two medicines at the same time can result in blood pressure that is too low. SEEK IMMEDIATE MEDICAL CARE IF:  You develop a severe headache, blurred or changing vision, or confusion.  You have unusual weakness or numbness, or a faint feeling.  You have severe chest or abdominal pain, vomiting, or breathing problems. MAKE SURE YOU:   Understand these instructions.  Will watch your condition.  Will get help right away if you are not doing well or get worse. Document Released: 05/16/2005 Document Revised: 08/08/2011  Document Reviewed: 01/04/2008 [facility] Patient Information 2014 [place], [place]. DASH Diet The DASH diet stands for "Dietary Approaches to Stop Hypertension." It is a healthy eating plan that has been shown to reduce high blood pressure (hypertension) in as little as 14 days, while also possibly providing other significant health benefits. These other health benefits include reducing the risk of breast cancer after menopause and reducing the risk of type 2 diabetes, heart disease, colon cancer, and stroke. Health benefits also include weight loss and slowing kidney failure in patients with chronic kidney disease.  DIET GUIDELINES  Limit salt (sodium). Your diet should contain less than 1500 mg of sodium daily.  Limit refined or processed carbohydrates. Your diet should include mostly whole grains. Desserts and added sugars should be used sparingly.  Include small amounts of heart-healthy fats. These types of fats include nuts, oils, and tub margarine. Limit saturated and trans fats. These fats have been shown to be harmful in the body. CHOOSING FOODS  The following food groups are based on a 2000 calorie diet. See your Registered Dietitian for individual calorie needs. Grains and Grain Products (6 to 8 servings daily)  Eat More Often: Whole-wheat bread, brown rice, whole-grain or wheat pasta, quinoa, popcorn without added fat or salt (air popped).  Eat  Less Often: White bread, white pasta, white rice, cornbread. Vegetables (4 to 5 servings daily)  Eat More Often: Fresh, frozen, and canned vegetables. Vegetables may be raw, steamed, roasted, or grilled with a minimal amount of fat.  Eat Less Often/Avoid: Creamed or fried vegetables. Vegetables in a cheese sauce. Fruit (4 to 5 servings daily)  Eat More Often: All fresh, canned (in natural juice), or frozen fruits. Dried fruits without added sugar. One hundred percent fruit juice ( cup [237 mL] daily).  Eat Less Often: Dried fruits with  added sugar. Canned fruit in light or heavy syrup. [employer], Fish, and Poultry (2 servings or less daily. One serving is 3 to 4 oz [85-114 g]).  Eat More Often: Ninety percent or leaner ground beef, tenderloin, sirloin. Round cuts of beef, chicken breast, [place] breast. All fish. Grill, bake, or broil your meat. Nothing should be fried.  Eat Less Often/Avoid: Fatty cuts of meat, [place], or chicken leg, thigh, or wing. Fried cuts of meat or fish. Dairy (2 to 3 servings)  Eat More Often: Low-fat or fat-free milk, low-fat plain or light yogurt, reduced-fat or part-skim cheese.  Eat Less Often/Avoid: Milk (whole, 2%).Whole milk yogurt. Full-fat cheeses. Nuts, Seeds, and Legumes (4 to 5 servings per week)  Eat More Often: All without added salt.  Eat Less Often/Avoid: Salted nuts and seeds, canned beans with added salt. Fats and Sweets (limited)  Eat More Often: Vegetable oils, tub margarines without trans fats, sugar-free gelatin. Mayonnaise and salad dressings.  Eat Less Often/Avoid: Coconut oils, palm oils, butter, stick margarine, cream, half and half, cookies, candy, pie. FOR MORE INFORMATION The Dash Diet Eating Plan: www.dashdiet.org Document Released: 05/05/2011 Document Revised: 08/08/2011 Document Reviewed: 05/05/2011 [facility] Patient Information 2014 [place], [place].

## 2013-03-18 NOTE — Progress Notes (Signed)
Patient ID: Abigail Beasley, female   DOB: 1970/12/12, 42 y.o.   MRN: [id number] Patient Demographics  Abigail Beasley, is a 42 y.o. female  ZHY:[id number]  [id number]:[id number]  DOB - June 26, 1970  CC:  Chief Complaint  Patient presents with  . Establish Care       HPI: Abigail Beasley is a 42 y.o. female here today to establish medical care. Patient has a medical history significant for hypertension, on lisinopril-hydrochlorothiazide 20-25 mg tablet by mouth daily, she claims to be compliant with medications. She came in today with multiple complaints, including dizziness has been going on for years, occasionally she had to stop driving to calm down. She said all her problems started in 1996 when she was incarcerated, she was in jail for about 6 and half years, subsequently she's been back on for jail about 3 or 4 times because of violation of parole. She had mammogram done last year in jail when the pain in her left breast started, mammogram was read as normal according to patient. Her dizziness has also worsened since she came out of jail, she was seen in the emergency department and given meclizine for a few days, since stopped the medication her dizziness has started again. It's worse when she shakes her head to the left side. She denies any headache, no neurological deficit, no visual impairment, no hearing impairment, she denies ringing in the ears. 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 She does not remember any known aggravating or relieving factors for her dizziness. She was diagnosed to have fibroid in the past as well as a spot in her liver (diagnosed with ultrasound as hemangioma). She wonders if these diagnosis have anything to do with her current symptoms. She does not smoke cigarette, drinks alcohol occasionally. She has 2 grownup children, 21 and 38 years old. She denies any depression. There is family history of both hypertension and diabetes, no family history of breast  cancer Patient has No headache, No chest pain, No abdominal pain - No Nausea, No new weakness tingling or numbness, No Cough - SOB.  No Known Allergies Past Medical History  Diagnosis Date  . Hypertension    Current Outpatient Prescriptions on File Prior to Visit  Medication Sig Dispense Refill  . diazepam (VALIUM) 5 MG tablet Take 5 mg by mouth every 12 (twelve) hours as needed (for muscle relaxer).      . diazepam (VALIUM) 5 MG tablet Take 1 tablet (5 mg total) by mouth 2 (two) times daily.  10 tablet  0  . ibuprofen (ADVIL,MOTRIN) 800 MG tablet Take 1 tablet (800 mg total) by mouth 3 (three) times daily.  21 tablet  0  . Multiple Vitamin (MULTIVITAMIN WITH MINERALS) TABS tablet Take 1 tablet by mouth daily.       Marland Kitchen oxyCODONE-acetaminophen (PERCOCET) 5-325 MG per tablet Take 1-2 tablets by mouth every 4 (four) hours as needed for pain.  20 tablet  0   No current facility-administered medications on file prior to visit.   Family History  Problem Relation Age of Onset  . Diabetes Mother   . Hypertension Mother   . Diabetes Maternal Uncle   . Diabetes Maternal Grandmother   . Hypertension Maternal Grandmother    History   Social History  . Marital Status: Single    Spouse Name: N/A    Number of Children: N/A  . Years of Education: N/A   Occupational History  . Not on  file.   Social History Main Topics  . Smoking status: Never Smoker   . Smokeless tobacco: Not on file  . Alcohol Use: No  . Drug Use: No  . Sexual Activity: Not on file   Other Topics Concern  . Not on file   Social History Narrative  . No narrative on file    Review of Systems: Constitutional: Negative for fever, chills, diaphoresis, activity change, appetite change and fatigue. HENT: Negative for ear pain, nosebleeds, congestion, facial swelling, rhinorrhea, neck pain, neck stiffness and ear discharge.  Eyes: Negative for pain, discharge, redness, itching and visual disturbance. Respiratory:  Negative for cough, choking, chest tightness, shortness of breath, wheezing and stridor.  Cardiovascular: Negative for chest pain, palpitations and leg swelling. Gastrointestinal: Negative for abdominal distention. Genitourinary: Negative for dysuria, urgency, frequency, hematuria, flank pain, decreased urine volume, difficulty urinating and dyspareunia.  Musculoskeletal: Negative for back pain, joint swelling, arthralgia and gait problem. Neurological: Negative for dizziness, tremors, seizures, syncope, facial asymmetry, speech difficulty, weakness, light-headedness, numbness and headaches.  Hematological: Negative for adenopathy. Does not bruise/bleed easily. Psychiatric/Behavioral: Negative for hallucinations, behavioral problems, confusion, dysphoric mood, decreased concentration and agitation.    Objective:   Filed Vitals:   03/18/13 1446  BP: 126/82  Pulse: 79  Temp: 98.4 F (36.9 C)  Resp: 16    Physical Exam: Constitutional: Patient appears well-developed and well-nourished. No distress. Obese HENT: Normocephalic, atraumatic, External right and left ear normal. Oropharynx is clear and moist.  Eyes: Conjunctivae and EOM are normal. PERRLA, no scleral icterus. Neck: Normal ROM. Neck supple. No JVD. No tracheal deviation. No thyromegaly. CVS: RRR, S1/S2 +, no murmurs, no gallops, no carotid bruit.  Pulmonary: Effort and breath sounds normal, no stridor, rhonchi, wheezes, rales.  Abdominal: Soft. BS +, no distension, tenderness, rebound or guarding.  Musculoskeletal: Normal range of motion. No edema and no tenderness.  Lymphadenopathy: No lymphadenopathy noted, cervical, inguinal or axillary Neuro: Alert. Normal reflexes, muscle tone coordination. No cranial nerve deficit. Skin: Skin is warm and dry. No rash noted. Not diaphoretic. No erythema. No pallor. Psychiatric: Normal mood and affect. Behavior, judgment, thought content normal.  Lab Results  Component Value Date   WBC  6.7 01/04/2013   HGB 13.1 01/04/2013   HCT 39.5 01/04/2013   MCV 89.2 01/04/2013   PLT 256 01/04/2013   Lab Results  Component Value Date   CREATININE 0.94 01/04/2013   BUN 13 01/04/2013   NA 138 01/04/2013   K 3.6 01/04/2013   CL 105 01/04/2013   CO2 25 01/04/2013    No results found for this basename: HGBA1C   Lipid Panel  No results found for this basename: chol, trig, hdl, cholhdl, vldl, ldlcalc       Assessment and plan:   Patient Active Problem List   Diagnosis Date Noted  . Dizziness and giddiness 03/18/2013  . Essential hypertension, benign 03/18/2013    Plan: Meclizine 25 mg tablet by mouth 3 times a day when necessary dizziness Lisinopril-hydrochlorothiazide 20-12.5 mg by mouth daily  Patient has been extensively counseled about dizziness, she was instructed not to drive, may need neurology evaluation if symptoms persists.  Patient has been counseled about nutrition and exercise  Labs: CBC differential CMP TSH Lipid panel Hemoglobin A1c Complete urinalysis  Will of the ultrasound of the left breast to rule out any pathology     Health Maintenance -Colonoscopy: Not applicable -Pap Smear: Scheduled in 4 weeks -Mammogram: Normal last year -Vaccinations:   -Influenza  Follow up in 4 weeks  for Pap smear or when necessary  The patient was given clear instructions to go to ER or return to medical center if symptoms don't improve, worsen or new problems develop. The patient verbalized understanding. The patient was told to call to get lab results if they haven't heard anything in the next week.     Jeanann Lewandowsky, MD, MHA, [facility] [place], [place] [phone]   03/18/2013, 3:34 PM

## 2013-03-19 LAB — URINALYSIS, COMPLETE
Casts: NONE SEEN
Crystals: NONE SEEN
Ketones, ur: NEGATIVE mg/dL
Leukocytes, UA: NEGATIVE
Nitrite: NEGATIVE
Specific Gravity, Urine: 1.024 (ref 1.005–1.030)
Urobilinogen, UA: 0.2 mg/dL (ref 0.0–1.0)
pH: 5.5 (ref 5.0–8.0)

## 2013-03-19 LAB — TSH: TSH: 1.559 u[IU]/mL (ref 0.350–4.500)

## 2013-04-26 ENCOUNTER — Ambulatory Visit: Payer: No Typology Code available for payment source

## 2013-05-03 ENCOUNTER — Ambulatory Visit: Payer: No Typology Code available for payment source | Admitting: Internal Medicine

## 2013-05-06 ENCOUNTER — Ambulatory Visit: Payer: No Typology Code available for payment source | Attending: Internal Medicine | Admitting: Internal Medicine

## 2013-05-06 ENCOUNTER — Encounter (INDEPENDENT_AMBULATORY_CARE_PROVIDER_SITE_OTHER): Payer: Self-pay

## 2013-05-06 ENCOUNTER — Encounter: Payer: Self-pay | Admitting: Internal Medicine

## 2013-05-06 VITALS — BP 121/81 | HR 80 | Temp 98.5°F | Resp 16 | Ht 67.0 in | Wt 288.0 lb

## 2013-05-06 DIAGNOSIS — R200 Anesthesia of skin: Secondary | ICD-10-CM

## 2013-05-06 DIAGNOSIS — M79605 Pain in left leg: Secondary | ICD-10-CM

## 2013-05-06 DIAGNOSIS — M25552 Pain in left hip: Secondary | ICD-10-CM

## 2013-05-06 MED ORDER — GABAPENTIN 100 MG PO CAPS: 100.0000 mg | ORAL_CAPSULE | Freq: Every day | ORAL | Status: DC

## 2013-05-06 MED ORDER — TRAMADOL HCL 50 MG PO TABS: 50.0000 mg | ORAL_TABLET | Freq: Three times a day (TID) | ORAL | Status: DC | PRN

## 2013-05-06 NOTE — Progress Notes (Unsigned)
Pt here to f/u Left hip pain radiating to left leg to toes since last visit 03/18/13 Intermit burning pain Medication not relieving pain Need refill Meclizine

## 2013-05-06 NOTE — Progress Notes (Unsigned)
Patient ID: Abigail Beasley, female   DOB: 1971/03/31, 42 y.o.   MRN: [id number]   HPI:    No Known Allergies Past Medical History  Diagnosis Date  . Hypertension     Family History  Problem Relation Age of Onset  . Diabetes Mother   . Hypertension Mother   . Diabetes Maternal Uncle   . Diabetes Maternal Grandmother   . Hypertension Maternal Grandmother    History   Social History  . Marital Status: Single    Spouse Name: N/A    Number of Children: N/A  . Years of Education: N/A   Occupational History  . Not on file.   Social History Main Topics  . Smoking status: Never Smoker   . Smokeless tobacco: Not on file  . Alcohol Use: No  . Drug Use: No  . Sexual Activity: Not on file   Other Topics Concern  . Not on file   Social History Narrative  . No narrative on file    Review of Systems  Constitutional: Negative for fever, chills, diaphoresis, activity change, appetite change and fatigue.  HENT: Negative for ear pain, nosebleeds, congestion, facial swelling, rhinorrhea, neck pain, neck stiffness and ear discharge.  Eyes: Negative for pain, discharge, redness, itching and visual disturbance.  Respiratory: Negative for cough, choking, chest tightness, shortness of breath, wheezing and stridor.  Cardiovascular: Negative for chest pain, palpitations and leg swelling.  Gastrointestinal: Negative for abdominal distention.  Genitourinary: Negative for dysuria, urgency, frequency, hematuria, flank pain, decreased urine volume, difficulty urinating and dyspareunia.  Musculoskeletal: positive for back pain, no joint swelling, arthralgias or gait problem.  Neurological: Negative for dizziness, tremors, seizures, syncope, facial asymmetry, speech difficulty, weakness, light-headedness, numbness and headaches.  Hematological: Negative for adenopathy. Does not bruise/bleed easily.  Psychiatric/Behavioral: Negative for hallucinations, behavioral problems, confusion, dysphoric  mood   Objective:   Filed Vitals:   05/06/13 1131  BP: 121/81  Pulse: 80  Temp: 98.5 F (36.9 C)  Resp: 16   Filed Weights   05/06/13 1131  Weight: 288 lb (130.636 kg)    Physical Exam ______ Constitutional: Appears well-developed and well-nourished. No distress. ____ HENT: Normocephalic. External right and left ear normal. Oropharynx is clear and moist. ____ Eyes: Conjunctivae and EOM are normal. PERRLA, no scleral icterus. ____ Neck: Normal ROM. Neck supple. No JVD. No tracheal deviation. No thyromegaly. ____ CVS: RRR, S1/S2 +, no murmurs, no gallops, no carotid bruit.  Pulmonary: Effort and breath sounds normal, no stridor, rhonchi, wheezes, rales.  Abdominal: Soft. BS +,  no distension, tenderness, rebound or guarding. ________ Musculoskeletal: Normal range of motion. No edema and no tenderness. ____ Neuro: Alert. Normal reflexes, muscle tone coordination. No cranial nerve deficit. Skin: Skin is warm and dry. No rash noted. Not diaphoretic. No erythema. No pallor. ____ Psychiatric: Normal mood and affect. Behavior, judgment, thought content normal. __  Lab Results  Component Value Date   WBC 7.3 03/18/2013   HGB 13.4 03/18/2013   HCT 39.1 03/18/2013   MCV 86.5 03/18/2013   PLT 314 03/18/2013   Lab Results  Component Value Date   CREATININE 1.01 03/18/2013   BUN 11 03/18/2013   NA 140 03/18/2013   K 3.5 03/18/2013   CL 107 03/18/2013   CO2 24 03/18/2013    No results found for this basename: HGBA1C   Lipid Panel     Component Value Date/Time   CHOL 191 03/18/2013 1608   TRIG 136 03/18/2013 1608   HDL  49 03/18/2013 1608   CHOLHDL 3.9 03/18/2013 1608   VLDL 27 03/18/2013 1608   LDLCALC 115* 03/18/2013 1608        Patient Active Problem List   Diagnosis Date Noted  . Dizziness and giddiness 03/18/2013  . Essential hypertension, benign 03/18/2013  . Breast pain, left 03/18/2013     Preventative Medicine:   Mammogram: Colonoscopy : Pap Smear  : Flu vaccine:   LAB WORK:  Metabolic panel: CBC:  Vitamin D : Lipid Panel: TSH:    Assessment and plan:      Calvert Cantor, MD

## 2013-05-13 ENCOUNTER — Other Ambulatory Visit: Payer: Self-pay | Admitting: Internal Medicine

## 2013-07-11 ENCOUNTER — Other Ambulatory Visit: Payer: Self-pay | Admitting: Internal Medicine

## 2013-12-06 ENCOUNTER — Ambulatory Visit: Payer: Self-pay

## 2014-03-24 ENCOUNTER — Other Ambulatory Visit: Payer: Self-pay | Admitting: Internal Medicine

## 2014-04-03 ENCOUNTER — Ambulatory Visit: Payer: Self-pay | Admitting: Internal Medicine

## 2014-04-11 ENCOUNTER — Ambulatory Visit: Payer: Self-pay | Admitting: Internal Medicine

## 2014-04-30 ENCOUNTER — Ambulatory Visit: Payer: Self-pay | Attending: Internal Medicine | Admitting: Internal Medicine

## 2014-04-30 ENCOUNTER — Encounter: Payer: Self-pay | Admitting: Internal Medicine

## 2014-04-30 VITALS — BP 160/98 | HR 72 | Temp 98.5°F | Resp 16 | Ht 67.0 in | Wt 267.0 lb

## 2014-04-30 DIAGNOSIS — M542 Cervicalgia: Secondary | ICD-10-CM | POA: Insufficient documentation

## 2014-04-30 DIAGNOSIS — M25552 Pain in left hip: Secondary | ICD-10-CM | POA: Insufficient documentation

## 2014-04-30 DIAGNOSIS — R0602 Shortness of breath: Secondary | ICD-10-CM | POA: Insufficient documentation

## 2014-04-30 DIAGNOSIS — R51 Headache: Secondary | ICD-10-CM | POA: Insufficient documentation

## 2014-04-30 DIAGNOSIS — I10 Essential (primary) hypertension: Secondary | ICD-10-CM | POA: Insufficient documentation

## 2014-04-30 DIAGNOSIS — F419 Anxiety disorder, unspecified: Secondary | ICD-10-CM | POA: Insufficient documentation

## 2014-04-30 DIAGNOSIS — R200 Anesthesia of skin: Secondary | ICD-10-CM | POA: Insufficient documentation

## 2014-04-30 MED ORDER — LISINOPRIL-HYDROCHLOROTHIAZIDE 20-12.5 MG PO TABS: 1.0000 | ORAL_TABLET | Freq: Every day | ORAL | Status: DC

## 2014-04-30 MED ORDER — CLONIDINE HCL 0.1 MG PO TABS
0.1000 mg | ORAL_TABLET | Freq: Once | ORAL | Status: AC
  Administered 2014-04-30: 0.1 mg via ORAL

## 2014-04-30 MED ORDER — ACETAMINOPHEN-CODEINE #3 300-30 MG PO TABS: 1.0000 | ORAL_TABLET | Freq: Three times a day (TID) | ORAL | Status: DC | PRN

## 2014-04-30 MED ORDER — LORAZEPAM 0.5 MG PO TABS: 0.5000 mg | ORAL_TABLET | Freq: Two times a day (BID) | ORAL | Status: DC | PRN

## 2014-04-30 NOTE — Progress Notes (Signed)
Patient ID: Abigail Beasley, female   DOB: 1970-12-11, 43 y.o.   MRN: [id number]  CC: numbness  HPI:  Been out of BP medication since the first week of November.  She states that she has been dealing with the death of her mother and has been unable to get a appointment.  Been having headaches in the occipital region for the past few days.  She states that when she has been at work and pushing her chart she has has some SOB and headaches. She reports that she has had numbness in her right hand, arm, and right neck pain since Friday. She has also been having left hip pain that has been affecting her sleep. Hip pain for one year.  No Known Allergies Past Medical History  Diagnosis Date  . Hypertension    Current Outpatient Prescriptions on File Prior to Visit  Medication Sig Dispense Refill  . gabapentin (NEURONTIN) 100 MG capsule Take 1 capsule (100 mg total) by mouth at bedtime. 90 capsule 3  . lisinopril-hydrochlorothiazide (PRINZIDE,ZESTORETIC) 20-12.5 MG per tablet Take 1 tablet by mouth daily. 90 tablet 3  . Multiple Vitamin (MULTIVITAMIN WITH MINERALS) TABS tablet Take 1 tablet by mouth daily.     . traMADol (ULTRAM) 50 MG tablet Take 1-2 tablets (50-100 mg total) by mouth every 8 (eight) hours as needed for moderate pain. (Patient not taking: Reported on 04/30/2014) 30 tablet 0  . TRAVEL SICKNESS 25 MG CHEW TAKE 1 TABLET BY MOUTH 3 TIMES DAILY AS NEEDED FOR DIZZINESS. (Patient not taking: Reported on 04/30/2014) 60 tablet 0   No current facility-administered medications on file prior to visit.   Family History  Problem Relation Age of Onset  . Diabetes Mother   . Hypertension Mother   . Diabetes Maternal Uncle   . Diabetes Maternal Grandmother   . Hypertension Maternal Grandmother    History   Social History  . Marital Status: Single    Spouse Name: N/A    Number of Children: N/A  . Years of Education: N/A   Occupational History  . Not on file.   Social History Main Topics  .  Smoking status: Never Smoker   . Smokeless tobacco: Not on file  . Alcohol Use: No  . Drug Use: No  . Sexual Activity: Not on file   Other Topics Concern  . Not on file   Social History Narrative    Review of Systems: See HPI    Objective:   Filed Vitals:   04/30/14 1629  BP: 171/107  Pulse: 72  Temp: 98.5 F (36.9 C)  Resp: 16    Physical Exam  Constitutional: She is oriented to person, place, and time.  Neck: Normal range of motion. Neck supple.  Cardiovascular: Normal rate, regular rhythm and normal heart sounds.   Pulmonary/Chest: Effort normal and breath sounds normal.  Musculoskeletal: Normal range of motion. She exhibits no edema.  Neurological: She is alert and oriented to person, place, and time. She has normal reflexes.     Lab Results  Component Value Date   WBC 7.3 03/18/2013   HGB 13.4 03/18/2013   HCT 39.1 03/18/2013   MCV 86.5 03/18/2013   PLT 314 03/18/2013   Lab Results  Component Value Date   CREATININE 1.01 03/18/2013   BUN 11 03/18/2013   NA 140 03/18/2013   K 3.5 03/18/2013   CL 107 03/18/2013   CO2 24 03/18/2013    No results found for: HGBA1C Lipid Panel  Component Value Date/Time   CHOL 191 03/18/2013 1608   TRIG 136 03/18/2013 1608   HDL 49 03/18/2013 1608   CHOLHDL 3.9 03/18/2013 1608   VLDL 27 03/18/2013 1608   LDLCALC 115* 03/18/2013 1608       Assessment and plan:   Taylynn was seen today for numbness.  Diagnoses and associated orders for this visit:  Essential hypertension, benign - cloNIDine (CATAPRES) tablet 0.1 mg; Take 1 tablet (0.1 mg total) by mouth once. - lisinopril-hydrochlorothiazide (PRINZIDE,ZESTORETIC) 20-12.5 MG per tablet; Take 1 tablet by mouth daily.  Explained to patient diet modifications that may contribute to elevated BP. Patient will avoid foods that are high in sodium such as  canned soups and vegetables, tomato juice, commercial baked goods, commercially prepared frozen or canned entrees  and sauces. Avoid salty snacks, added salt when cooking, and substituting for low sodium herbs or spices Explained exercise regimen of cardio at least three times weekly to help lower BP and cholesterol  Left hip pain - acetaminophen-codeine (TYLENOL #3) 300-30 MG per tablet; Take 1 tablet by mouth every 8 (eight) hours as needed for moderate pain.  Anxiety - LORazepam (ATIVAN) 0.5 MG tablet; Take 1 tablet (0.5 mg total) by mouth 2 (two) times daily as needed for anxiety.   Return in about 2 weeks (around 05/14/2014) for Nurse Visit-BP check and 3 mo PCP.        Chari Manning, NP-C [facility] [phone] 04/30/2014, 5:00 PM

## 2014-04-30 NOTE — Progress Notes (Signed)
Medicine refills HTN Complaining of Lt hip pain,  Rt hand and arm pain and numbness  Neck pain rt side.

## 2014-07-28 ENCOUNTER — Ambulatory Visit (INDEPENDENT_AMBULATORY_CARE_PROVIDER_SITE_OTHER): Payer: BLUE CROSS/BLUE SHIELD | Admitting: Family Medicine

## 2014-07-28 ENCOUNTER — Ambulatory Visit (INDEPENDENT_AMBULATORY_CARE_PROVIDER_SITE_OTHER): Payer: BLUE CROSS/BLUE SHIELD

## 2014-07-28 ENCOUNTER — Encounter: Payer: Self-pay | Admitting: Family Medicine

## 2014-07-28 VITALS — BP 120/70 | HR 75 | Temp 98.3°F | Resp 16 | Ht 67.0 in | Wt 261.4 lb

## 2014-07-28 DIAGNOSIS — I10 Essential (primary) hypertension: Secondary | ICD-10-CM

## 2014-07-28 DIAGNOSIS — M5442 Lumbago with sciatica, left side: Secondary | ICD-10-CM

## 2014-07-28 DIAGNOSIS — M25552 Pain in left hip: Secondary | ICD-10-CM

## 2014-07-28 DIAGNOSIS — Z13220 Encounter for screening for lipoid disorders: Secondary | ICD-10-CM

## 2014-07-28 DIAGNOSIS — M7062 Trochanteric bursitis, left hip: Secondary | ICD-10-CM

## 2014-07-28 DIAGNOSIS — G5601 Carpal tunnel syndrome, right upper limb: Secondary | ICD-10-CM

## 2014-07-28 LAB — COMPREHENSIVE METABOLIC PANEL
ALBUMIN: 4 g/dL (ref 3.5–5.2)
ALK PHOS: 59 U/L (ref 39–117)
ALT: 23 U/L (ref 0–35)
AST: 23 U/L (ref 0–37)
BILIRUBIN TOTAL: 0.3 mg/dL (ref 0.2–1.2)
BUN: 15 mg/dL (ref 6–23)
CO2: 24 meq/L (ref 19–32)
Calcium: 9.6 mg/dL (ref 8.4–10.5)
Chloride: 106 mEq/L (ref 96–112)
Creat: 0.97 mg/dL (ref 0.50–1.10)
Glucose, Bld: 96 mg/dL (ref 70–99)
Potassium: 3.6 mEq/L (ref 3.5–5.3)
SODIUM: 137 meq/L (ref 135–145)
Total Protein: 6.7 g/dL (ref 6.0–8.3)

## 2014-07-28 LAB — POCT URINALYSIS DIPSTICK
BILIRUBIN UA: NEGATIVE
Blood, UA: NEGATIVE
Glucose, UA: NEGATIVE
KETONES UA: NEGATIVE
NITRITE UA: NEGATIVE
PH UA: 5
PROTEIN UA: NEGATIVE
Spec Grav, UA: 1.025
Urobilinogen, UA: 0.2

## 2014-07-28 LAB — CBC WITH DIFFERENTIAL/PLATELET
BASOS ABS: 0 10*3/uL (ref 0.0–0.1)
Basophils Relative: 0 % (ref 0–1)
EOS ABS: 0.1 10*3/uL (ref 0.0–0.7)
EOS PCT: 2 % (ref 0–5)
HCT: 38.9 % (ref 36.0–46.0)
Hemoglobin: 12.9 g/dL (ref 12.0–15.0)
Lymphocytes Relative: 30 % (ref 12–46)
Lymphs Abs: 2 10*3/uL (ref 0.7–4.0)
MCH: 29.3 pg (ref 26.0–34.0)
MCHC: 33.2 g/dL (ref 30.0–36.0)
MCV: 88.2 fL (ref 78.0–100.0)
MONO ABS: 0.3 10*3/uL (ref 0.1–1.0)
MPV: 9.6 fL (ref 8.6–12.4)
Monocytes Relative: 5 % (ref 3–12)
Neutro Abs: 4.3 10*3/uL (ref 1.7–7.7)
Neutrophils Relative %: 63 % (ref 43–77)
PLATELETS: 300 10*3/uL (ref 150–400)
RBC: 4.41 MIL/uL (ref 3.87–5.11)
RDW: 13.8 % (ref 11.5–15.5)
WBC: 6.8 10*3/uL (ref 4.0–10.5)

## 2014-07-28 LAB — LIPID PANEL
CHOL/HDL RATIO: 2.8 ratio
CHOLESTEROL: 182 mg/dL (ref 0–200)
HDL: 65 mg/dL (ref >=46)
LDL Cholesterol: 101 mg/dL — ABNORMAL HIGH (ref 0–99)
Triglycerides: 80 mg/dL (ref <150)
VLDL: 16 mg/dL (ref 0–40)

## 2014-07-28 LAB — TSH: TSH: 1.621 u[IU]/mL (ref 0.350–4.500)

## 2014-07-28 MED ORDER — LOSARTAN POTASSIUM-HCTZ 100-12.5 MG PO TABS: 1.0000 | ORAL_TABLET | Freq: Every day | ORAL | Status: DC

## 2014-07-28 MED ORDER — PREDNISONE 20 MG PO TABS: ORAL_TABLET | ORAL | Status: DC

## 2014-07-28 NOTE — Patient Instructions (Signed)
1.  RECOMMEND WEARING WRIST SPLINT AT BEDTIME FOR THE NEXT THREE MONTHS.   Sciatica with Rehab The sciatic nerve runs from the back down the leg and is responsible for sensation and control of the muscles in the back (posterior) side of the thigh, lower leg, and foot. Sciatica is a condition that is characterized by inflammation of this nerve.  SYMPTOMS   Signs of nerve damage, including numbness and/or weakness along the posterior side of the lower extremity.  Pain in the back of the thigh that may also travel down the leg.  Pain that worsens when sitting for long periods of time.  Occasionally, pain in the back or buttock. CAUSES  Inflammation of the sciatic nerve is the cause of sciatica. The inflammation is due to something irritating the nerve. Common sources of irritation include:  Sitting for long periods of time.  Direct trauma to the nerve.  Arthritis of the spine.  Herniated or ruptured disk.  Slipping of the vertebrae (spondylolisthesis).  Pressure from soft tissues, such as muscles or ligament-like tissue (fascia). RISK INCREASES WITH:  Sports that place pressure or stress on the spine (football or weightlifting).  Poor strength and flexibility.  Failure to warm up properly before activity.  Family history of low back pain or disk disorders.  Previous back injury or surgery.  Poor body mechanics, especially when lifting, or poor posture. PREVENTION   Warm up and stretch properly before activity.  Maintain physical fitness:  Strength, flexibility, and endurance.  Cardiovascular fitness.  Learn and use proper technique, especially with posture and lifting. When possible, have coach correct improper technique.  Avoid activities that place stress on the spine. PROGNOSIS If treated properly, then sciatica usually resolves within 6 weeks. However, occasionally surgery is necessary.  RELATED COMPLICATIONS   Permanent nerve damage, including pain,  numbness, tingle, or weakness.  Chronic back pain.  Risks of surgery: infection, bleeding, nerve damage, or damage to surrounding tissues. TREATMENT Treatment initially involves resting from any activities that aggravate your symptoms. The use of ice and medication may help reduce pain and inflammation. The use of strengthening and stretching exercises may help reduce pain with activity. These exercises may be performed at home or with referral to a therapist. A therapist may recommend further treatments, such as transcutaneous electronic nerve stimulation (TENS) or ultrasound. Your caregiver may recommend corticosteroid injections to help reduce inflammation of the sciatic nerve. If symptoms persist despite non-surgical (conservative) treatment, then surgery may be recommended. MEDICATION  If pain medication is necessary, then nonsteroidal anti-inflammatory medications, such as aspirin and ibuprofen, or other minor pain relievers, such as acetaminophen, are often recommended.  Do not take pain medication for 7 days before surgery.  Prescription pain relievers may be given if deemed necessary by your caregiver. Use only as directed and only as much as you need.  Ointments applied to the skin may be helpful.  Corticosteroid injections may be given by your caregiver. These injections should be reserved for the most serious cases, because they may only be given a certain number of times. HEAT AND COLD  Cold treatment (icing) relieves pain and reduces inflammation. Cold treatment should be applied for 10 to 15 minutes every 2 to 3 hours for inflammation and pain and immediately after any activity that aggravates your symptoms. Use ice packs or massage the area with a piece of ice (ice massage).  Heat treatment may be used prior to performing the stretching and strengthening activities prescribed by your caregiver, physical  therapist, or athletic trainer. Use a heat pack or soak the injury in warm  water. SEEK MEDICAL CARE IF:  Treatment seems to offer no benefit, or the condition worsens.  Any medications produce adverse side effects. EXERCISES  RANGE OF MOTION (ROM) AND STRETCHING EXERCISES - Sciatica Most people with sciatic will find that their symptoms worsen with either excessive bending forward (flexion) or arching at the low back (extension). The exercises which will help resolve your symptoms will focus on the opposite motion. Your physician, physical therapist or athletic trainer will help you determine which exercises will be most helpful to resolve your low back pain. Do not complete any exercises without first consulting with your clinician. Discontinue any exercises which worsen your symptoms until you speak to your clinician. If you have pain, numbness or tingling which travels down into your buttocks, leg or foot, the goal of the therapy is for these symptoms to move closer to your back and eventually resolve. Occasionally, these leg symptoms will get better, but your low back pain may worsen; this is typically an indication of progress in your rehabilitation. Be certain to be very alert to any changes in your symptoms and the activities in which you participated in the 24 hours prior to the change. Sharing this information with your clinician will allow him/her to most efficiently treat your condition. These exercises may help you when beginning to rehabilitate your injury. Your symptoms may resolve with or without further involvement from your physician, physical therapist or athletic trainer. While completing these exercises, remember:   Restoring tissue flexibility helps normal motion to return to the joints. This allows healthier, less painful movement and activity.  An effective stretch should be held for at least 30 seconds.  A stretch should never be painful. You should only feel a gentle lengthening or release in the stretched tissue. FLEXION RANGE OF MOTION AND  STRETCHING EXERCISES: STRETCH - Flexion, Single Knee to Chest   Lie on a firm bed or floor with both legs extended in front of you.  Keeping one leg in contact with the floor, bring your opposite knee to your chest. Hold your leg in place by either grabbing behind your thigh or at your knee.  Pull until you feel a gentle stretch in your low back. Hold __________ seconds.  Slowly release your grasp and repeat the exercise with the opposite side. Repeat __________ times. Complete this exercise __________ times per day.  STRETCH - Flexion, Double Knee to Chest  Lie on a firm bed or floor with both legs extended in front of you.  Keeping one leg in contact with the floor, bring your opposite knee to your chest.  Tense your stomach muscles to support your back and then lift your other knee to your chest. Hold your legs in place by either grabbing behind your thighs or at your knees.  Pull both knees toward your chest until you feel a gentle stretch in your low back. Hold __________ seconds.  Tense your stomach muscles and slowly return one leg at a time to the floor. Repeat __________ times. Complete this exercise __________ times per day.  STRETCH - Low Trunk Rotation   Lie on a firm bed or floor. Keeping your legs in front of you, bend your knees so they are both pointed toward the ceiling and your feet are flat on the floor.  Extend your arms out to the side. This will stabilize your upper body by keeping your shoulders in contact  with the floor.  Gently and slowly drop both knees together to one side until you feel a gentle stretch in your low back. Hold for __________ seconds.  Tense your stomach muscles to support your low back as you bring your knees back to the starting position. Repeat the exercise to the other side. Repeat __________ times. Complete this exercise __________ times per day  EXTENSION RANGE OF MOTION AND FLEXIBILITY EXERCISES: STRETCH - Extension, Prone on  Elbows  Lie on your stomach on the floor, a bed will be too soft. Place your palms about shoulder width apart and at the height of your head.  Place your elbows under your shoulders. If this is too painful, stack pillows under your chest.  Allow your body to relax so that your hips drop lower and make contact more completely with the floor.  Hold this position for __________ seconds.  Slowly return to lying flat on the floor. Repeat __________ times. Complete this exercise __________ times per day.  RANGE OF MOTION - Extension, Prone Press Ups  Lie on your stomach on the floor, a bed will be too soft. Place your palms about shoulder width apart and at the height of your head.  Keeping your back as relaxed as possible, slowly straighten your elbows while keeping your hips on the floor. You may adjust the placement of your hands to maximize your comfort. As you gain motion, your hands will come more underneath your shoulders.  Hold this position __________ seconds.  Slowly return to lying flat on the floor. Repeat __________ times. Complete this exercise __________ times per day.  STRENGTHENING EXERCISES - Sciatica  These exercises may help you when beginning to rehabilitate your injury. These exercises should be done near your "sweet spot." This is the neutral, low-back arch, somewhere between fully rounded and fully arched, that is your least painful position. When performed in this safe range of motion, these exercises can be used for people who have either a flexion or extension based injury. These exercises may resolve your symptoms with or without further involvement from your physician, physical therapist or athletic trainer. While completing these exercises, remember:   Muscles can gain both the endurance and the strength needed for everyday activities through controlled exercises.  Complete these exercises as instructed by your physician, physical therapist or athletic trainer.  Progress with the resistance and repetition exercises only as your caregiver advises.  You may experience muscle soreness or fatigue, but the pain or discomfort you are trying to eliminate should never worsen during these exercises. If this pain does worsen, stop and make certain you are following the directions exactly. If the pain is still present after adjustments, discontinue the exercise until you can discuss the trouble with your clinician. STRENGTHENING - Deep Abdominals, Pelvic Tilt   Lie on a firm bed or floor. Keeping your legs in front of you, bend your knees so they are both pointed toward the ceiling and your feet are flat on the floor.  Tense your lower abdominal muscles to press your low back into the floor. This motion will rotate your pelvis so that your tail bone is scooping upwards rather than pointing at your feet or into the floor.  With a gentle tension and even breathing, hold this position for __________ seconds. Repeat __________ times. Complete this exercise __________ times per day.  STRENGTHENING - Abdominals, Crunches   Lie on a firm bed or floor. Keeping your legs in front of you, bend your knees  so they are both pointed toward the ceiling and your feet are flat on the floor. Cross your arms over your chest.  Slightly tip your chin down without bending your neck.  Tense your abdominals and slowly lift your trunk high enough to just clear your shoulder blades. Lifting higher can put excessive stress on the low back and does not further strengthen your abdominal muscles.  Control your return to the starting position. Repeat __________ times. Complete this exercise __________ times per day.  STRENGTHENING - Quadruped, Opposite UE/LE Lift  Assume a hands and knees position on a firm surface. Keep your hands under your shoulders and your knees under your hips. You may place padding under your knees for comfort.  Find your neutral spine and gently tense your abdominal  muscles so that you can maintain this position. Your shoulders and hips should form a rectangle that is parallel with the floor and is not twisted.  Keeping your trunk steady, lift your right hand no higher than your shoulder and then your left leg no higher than your hip. Make sure you are not holding your breath. Hold this position __________ seconds.  Continuing to keep your abdominal muscles tense and your back steady, slowly return to your starting position. Repeat with the opposite arm and leg. Repeat __________ times. Complete this exercise __________ times per day.  STRENGTHENING - Abdominals and Quadriceps, Straight Leg Raise   Lie on a firm bed or floor with both legs extended in front of you.  Keeping one leg in contact with the floor, bend the other knee so that your foot can rest flat on the floor.  Find your neutral spine, and tense your abdominal muscles to maintain your spinal position throughout the exercise.  Slowly lift your straight leg off the floor about 6 inches for a count of 15, making sure to not hold your breath.  Still keeping your neutral spine, slowly lower your leg all the way to the floor. Repeat this exercise with each leg __________ times. Complete this exercise __________ times per day. POSTURE AND BODY MECHANICS CONSIDERATIONS - Sciatica Keeping correct posture when sitting, standing or completing your activities will reduce the stress put on different body tissues, allowing injured tissues a chance to heal and limiting painful experiences. The following are general guidelines for improved posture. Your physician or physical therapist will provide you with any instructions specific to your needs. While reading these guidelines, remember:  The exercises prescribed by your provider will help you have the flexibility and strength to maintain correct postures.  The correct posture provides the optimal environment for your joints to work. All of your joints have  less wear and tear when properly supported by a spine with good posture. This means you will experience a healthier, less painful body.  Correct posture must be practiced with all of your activities, especially prolonged sitting and standing. Correct posture is as important when doing repetitive low-stress activities (typing) as it is when doing a single heavy-load activity (lifting). RESTING POSITIONS Consider which positions are most painful for you when choosing a resting position. If you have pain with flexion-based activities (sitting, bending, stooping, squatting), choose a position that allows you to rest in a less flexed posture. You would want to avoid curling into a fetal position on your side. If your pain worsens with extension-based activities (prolonged standing, working overhead), avoid resting in an extended position such as sleeping on your stomach. Most people will find more comfort when  they rest with their spine in a more neutral position, neither too rounded nor too arched. Lying on a non-sagging bed on your side with a pillow between your knees, or on your back with a pillow under your knees will often provide some relief. Keep in mind, being in any one position for a prolonged period of time, no matter how correct your posture, can still lead to stiffness. PROPER SITTING POSTURE In order to minimize stress and discomfort on your spine, you must sit with correct posture Sitting with good posture should be effortless for a healthy body. Returning to good posture is a gradual process. Many people can work toward this most comfortably by using various supports until they have the flexibility and strength to maintain this posture on their own. When sitting with proper posture, your ears will fall over your shoulders and your shoulders will fall over your hips. You should use the back of the chair to support your upper back. Your low back will be in a neutral position, just slightly arched.  You may place a small pillow or folded towel at the base of your low back for support.  When working at a desk, create an environment that supports good, upright posture. Without extra support, muscles fatigue and lead to excessive strain on joints and other tissues. Keep these recommendations in mind: CHAIR:   A chair should be able to slide under your desk when your back makes contact with the back of the chair. This allows you to work closely.  The chair's height should allow your eyes to be level with the upper part of your monitor and your hands to be slightly lower than your elbows. BODY POSITION  Your feet should make contact with the floor. If this is not possible, use a foot rest.  Keep your ears over your shoulders. This will reduce stress on your neck and low back. INCORRECT SITTING POSTURES   If you are feeling tired and unable to assume a healthy sitting posture, do not slouch or slump. This puts excessive strain on your back tissues, causing more damage and pain. Healthier options include:  Using more support, like a lumbar pillow.  Switching tasks to something that requires you to be upright or walking.  Talking a brief walk.  Lying down to rest in a neutral-spine position. PROLONGED STANDING WHILE SLIGHTLY LEANING FORWARD  When completing a task that requires you to lean forward while standing in one place for a long time, place either foot up on a stationary 2-4 inch high object to help maintain the best posture. When both feet are on the ground, the low back tends to lose its slight inward curve. If this curve flattens (or becomes too large), then the back and your other joints will experience too much stress, fatigue more quickly and can cause pain.  CORRECT STANDING POSTURES Proper standing posture should be assumed with all daily activities, even if they only take a few moments, like when brushing your teeth. As in sitting, your ears should fall over your shoulders and  your shoulders should fall over your hips. You should keep a slight tension in your abdominal muscles to brace your spine. Your tailbone should point down to the ground, not behind your body, resulting in an over-extended swayback posture.  INCORRECT STANDING POSTURES  Common incorrect standing postures include a forward head, locked knees and/or an excessive swayback. WALKING Walk with an upright posture. Your ears, shoulders and hips should all line-up. PROLONGED  ACTIVITY IN A FLEXED POSITION When completing a task that requires you to bend forward at your waist or lean over a low surface, try to find a way to stabilize 3 of 4 of your limbs. You can place a hand or elbow on your thigh or rest a knee on the surface you are reaching across. This will provide you more stability so that your muscles do not fatigue as quickly. By keeping your knees relaxed, or slightly bent, you will also reduce stress across your low back. CORRECT LIFTING TECHNIQUES DO :   Assume a wide stance. This will provide you more stability and the opportunity to get as close as possible to the object which you are lifting.  Tense your abdominals to brace your spine; then bend at the knees and hips. Keeping your back locked in a neutral-spine position, lift using your leg muscles. Lift with your legs, keeping your back straight.  Test the weight of unknown objects before attempting to lift them.  Try to keep your elbows locked down at your sides in order get the best strength from your shoulders when carrying an object.  Always ask for help when lifting heavy or awkward objects. INCORRECT LIFTING TECHNIQUES DO NOT:   Lock your knees when lifting, even if it is a small object.  Bend and twist. Pivot at your feet or move your feet when needing to change directions.  Assume that you cannot safely pick up a paperclip without proper posture. Document Released: 05/16/2005 Document Revised: 09/30/2013 Document Reviewed:  08/28/2008 [facility] Patient Information 2015 [place], [place]. This information is not intended to replace advice given to you by your health care provider. Make sure you discuss any questions you have with your health care provider.
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
## 2014-07-29 ENCOUNTER — Encounter: Payer: Self-pay | Admitting: Family Medicine

## 2014-07-29 NOTE — Addendum Note (Signed)
Addended by: Wardell Honour on: 07/29/2014 03:08 PM   Modules accepted: Orders, Medications

## 2014-09-08 ENCOUNTER — Encounter: Payer: Self-pay | Admitting: Family Medicine

## 2014-09-08 ENCOUNTER — Ambulatory Visit (INDEPENDENT_AMBULATORY_CARE_PROVIDER_SITE_OTHER): Payer: BLUE CROSS/BLUE SHIELD | Admitting: Family Medicine

## 2014-09-08 VITALS — BP 145/84 | HR 67 | Temp 98.0°F | Resp 16 | Ht 67.5 in | Wt 261.0 lb

## 2014-09-08 DIAGNOSIS — Z0000 Encounter for general adult medical examination without abnormal findings: Secondary | ICD-10-CM

## 2014-09-08 DIAGNOSIS — G5603 Carpal tunnel syndrome, bilateral upper limbs: Secondary | ICD-10-CM

## 2014-09-08 DIAGNOSIS — Z1211 Encounter for screening for malignant neoplasm of colon: Secondary | ICD-10-CM | POA: Diagnosis not present

## 2014-09-08 DIAGNOSIS — Z7251 High risk heterosexual behavior: Secondary | ICD-10-CM

## 2014-09-08 DIAGNOSIS — Z1239 Encounter for other screening for malignant neoplasm of breast: Secondary | ICD-10-CM

## 2014-09-08 DIAGNOSIS — Z01419 Encounter for gynecological examination (general) (routine) without abnormal findings: Secondary | ICD-10-CM

## 2014-09-08 DIAGNOSIS — Z23 Encounter for immunization: Secondary | ICD-10-CM

## 2014-09-08 DIAGNOSIS — J301 Allergic rhinitis due to pollen: Secondary | ICD-10-CM | POA: Diagnosis not present

## 2014-09-08 DIAGNOSIS — G5602 Carpal tunnel syndrome, left upper limb: Secondary | ICD-10-CM | POA: Diagnosis not present

## 2014-09-08 DIAGNOSIS — G5601 Carpal tunnel syndrome, right upper limb: Secondary | ICD-10-CM

## 2014-09-08 DIAGNOSIS — R0683 Snoring: Secondary | ICD-10-CM

## 2014-09-08 DIAGNOSIS — M25552 Pain in left hip: Secondary | ICD-10-CM | POA: Diagnosis not present

## 2014-09-08 DIAGNOSIS — I10 Essential (primary) hypertension: Secondary | ICD-10-CM | POA: Diagnosis not present

## 2014-09-08 LAB — IFOBT (OCCULT BLOOD): IFOBT: POSITIVE

## 2014-09-08 LAB — POCT URINALYSIS DIPSTICK
Bilirubin, UA: NEGATIVE
GLUCOSE UA: NEGATIVE
KETONES UA: NEGATIVE
LEUKOCYTES UA: NEGATIVE
NITRITE UA: NEGATIVE
Protein, UA: NEGATIVE
SPEC GRAV UA: 1.02
UROBILINOGEN UA: 0.2
pH, UA: 5.5

## 2014-09-08 LAB — RPR

## 2014-09-08 LAB — HIV ANTIBODY (ROUTINE TESTING W REFLEX): HIV: NONREACTIVE

## 2014-09-08 MED ORDER — TRIAMTERENE-HCTZ 37.5-25 MG PO TABS: 1.0000 | ORAL_TABLET | Freq: Every day | ORAL | Status: DC

## 2014-09-08 MED ORDER — MELOXICAM 15 MG PO TABS: 15.0000 mg | ORAL_TABLET | Freq: Every day | ORAL | Status: DC
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
## 2014-09-08 NOTE — Patient Instructions (Signed)

## 2014-09-09 ENCOUNTER — Encounter: Payer: Self-pay | Admitting: Family Medicine

## 2014-09-10 LAB — PAP IG, CT-NG NAA, HPV HIGH-RISK
Chlamydia Probe Amp: NEGATIVE
GC PROBE AMP: NEGATIVE
HPV DNA High Risk: NOT DETECTED

## 2014-09-11 ENCOUNTER — Other Ambulatory Visit: Payer: Self-pay | Admitting: Family Medicine

## 2014-09-11 DIAGNOSIS — Z1231 Encounter for screening mammogram for malignant neoplasm of breast: Secondary | ICD-10-CM

## 2014-09-12 ENCOUNTER — Ambulatory Visit
Admission: RE | Admit: 2014-09-12 | Discharge: 2014-09-12 | Disposition: A | Discharge status: Home / Self Care | Payer: BLUE CROSS/BLUE SHIELD | Source: Ambulatory Visit | Attending: Family Medicine | Admitting: Family Medicine

## 2014-09-12 DIAGNOSIS — Z1231 Encounter for screening mammogram for malignant neoplasm of breast: Secondary | ICD-10-CM

## 2014-09-16 ENCOUNTER — Other Ambulatory Visit: Payer: Self-pay | Admitting: Family Medicine

## 2014-09-16 DIAGNOSIS — R928 Other abnormal and inconclusive findings on diagnostic imaging of breast: Secondary | ICD-10-CM

## 2014-09-25 ENCOUNTER — Ambulatory Visit
Admission: RE | Admit: 2014-09-25 | Discharge: 2014-09-25 | Disposition: A | Discharge status: Home / Self Care | Payer: BLUE CROSS/BLUE SHIELD | Source: Ambulatory Visit | Attending: Family Medicine | Admitting: Family Medicine

## 2014-09-25 DIAGNOSIS — R928 Other abnormal and inconclusive findings on diagnostic imaging of breast: Secondary | ICD-10-CM

## 2014-10-06 ENCOUNTER — Institutional Professional Consult (permissible substitution): Payer: BLUE CROSS/BLUE SHIELD | Admitting: Neurology

## 2014-10-10 ENCOUNTER — Institutional Professional Consult (permissible substitution): Payer: BLUE CROSS/BLUE SHIELD | Admitting: Neurology

## 2014-10-16 ENCOUNTER — Institutional Professional Consult (permissible substitution): Payer: BLUE CROSS/BLUE SHIELD | Admitting: Neurology

## 2014-11-17 ENCOUNTER — Ambulatory Visit: Payer: BLUE CROSS/BLUE SHIELD | Admitting: Family Medicine

## 2015-02-17 ENCOUNTER — Ambulatory Visit: Payer: BLUE CROSS/BLUE SHIELD | Admitting: Physician Assistant

## 2015-02-20 ENCOUNTER — Encounter: Payer: Self-pay | Admitting: *Deleted

## 2015-04-27 ENCOUNTER — Ambulatory Visit: Payer: BLUE CROSS/BLUE SHIELD | Admitting: Physician Assistant

## 2015-06-10 ENCOUNTER — Telehealth: Payer: Self-pay | Admitting: Family Medicine

## 2015-06-10 NOTE — Telephone Encounter (Signed)
Left a message for patient to return call and schedule appointment for follow up on hypertension

## 2015-09-14 ENCOUNTER — Other Ambulatory Visit: Payer: Self-pay | Admitting: Family Medicine

## 2016-08-25 ENCOUNTER — Ambulatory Visit: Payer: BLUE CROSS/BLUE SHIELD | Admitting: Family Medicine

## 2016-09-02 ENCOUNTER — Ambulatory Visit (INDEPENDENT_AMBULATORY_CARE_PROVIDER_SITE_OTHER): Payer: BLUE CROSS/BLUE SHIELD | Admitting: Family Medicine

## 2016-09-02 ENCOUNTER — Encounter: Payer: Self-pay | Admitting: Family Medicine

## 2016-09-02 VITALS — BP 172/96 | HR 67 | Temp 97.4°F | Resp 16 | Ht 67.5 in | Wt 249.0 lb

## 2016-09-02 DIAGNOSIS — I10 Essential (primary) hypertension: Secondary | ICD-10-CM | POA: Diagnosis not present

## 2016-09-02 DIAGNOSIS — M25473 Effusion, unspecified ankle: Secondary | ICD-10-CM

## 2016-09-02 DIAGNOSIS — R102 Pelvic and perineal pain: Secondary | ICD-10-CM

## 2016-09-02 DIAGNOSIS — N920 Excessive and frequent menstruation with regular cycle: Secondary | ICD-10-CM

## 2016-09-02 DIAGNOSIS — Z9109 Other allergy status, other than to drugs and biological substances: Secondary | ICD-10-CM

## 2016-09-02 LAB — CBC WITH DIFFERENTIAL/PLATELET
Basophils Absolute: 132 cells/uL (ref 0–200)
Basophils Relative: 2 %
Eosinophils Absolute: 132 cells/uL (ref 15–500)
Eosinophils Relative: 2 %
HCT: 39.2 % (ref 35.0–45.0)
Hemoglobin: 12.7 g/dL (ref 11.7–15.5)
Lymphocytes Relative: 24 %
Lymphs Abs: 1584 cells/uL (ref 850–3900)
MCH: 28.9 pg (ref 27.0–33.0)
MCHC: 32.4 g/dL (ref 32.0–36.0)
MCV: 89.1 fL (ref 80.0–100.0)
MPV: 9.8 fL (ref 7.5–12.5)
Monocytes Absolute: 462 cells/uL (ref 200–950)
Monocytes Relative: 7 %
NEUTROS PCT: 65 %
Neutro Abs: 4290 cells/uL (ref 1500–7800)
Platelets: 295 10*3/uL (ref 140–400)
RBC: 4.4 MIL/uL (ref 3.80–5.10)
RDW: 13.9 % (ref 11.0–15.0)
WBC: 6.6 10*3/uL (ref 3.8–10.8)

## 2016-09-02 LAB — POCT GLYCOSYLATED HEMOGLOBIN (HGB A1C): HEMOGLOBIN A1C: 5.2

## 2016-09-02 LAB — TSH: TSH: 1.36 mIU/L

## 2016-09-02 LAB — COMPLETE METABOLIC PANEL WITH GFR
ALT: 14 U/L (ref 6–29)
AST: 17 U/L (ref 10–35)
Albumin: 4 g/dL (ref 3.6–5.1)
Alkaline Phosphatase: 56 U/L (ref 33–115)
BUN: 16 mg/dL (ref 7–25)
CALCIUM: 9.1 mg/dL (ref 8.6–10.2)
CHLORIDE: 107 mmol/L (ref 98–110)
CO2: 27 mmol/L (ref 20–31)
CREATININE: 0.98 mg/dL (ref 0.50–1.10)
GFR, Est African American: 79 mL/min (ref >=60)
GFR, Est Non African American: 69 mL/min (ref >=60)
Glucose, Bld: 87 mg/dL (ref 65–99)
Potassium: 4.4 mmol/L (ref 3.5–5.3)
Sodium: 141 mmol/L (ref 135–146)
Total Bilirubin: 0.4 mg/dL (ref 0.2–1.2)
Total Protein: 6.7 g/dL (ref 6.1–8.1)

## 2016-09-02 MED ORDER — CLONIDINE HCL 0.1 MG PO TABS
0.1000 mg | ORAL_TABLET | Freq: Once | ORAL | Status: AC
  Administered 2016-09-02: 0.1 mg via ORAL

## 2016-09-02 MED ORDER — LEVOCETIRIZINE DIHYDROCHLORIDE 5 MG PO TABS: 5.0000 mg | ORAL_TABLET | Freq: Every evening | ORAL | 5 refills | Status: DC

## 2016-09-02 MED ORDER — TRIAMTERENE-HCTZ 37.5-25 MG PO TABS: 1.0000 | ORAL_TABLET | Freq: Every day | ORAL | 5 refills | Status: DC

## 2016-09-02 NOTE — Patient Instructions (Addendum)
Hypertension: Will restart triamterene-hydrochlorothiazide for hypertension. Follow DASH diet  Scheduled pelvic/transvaginal ultrasound for pelvic pain   DASH Eating Plan DASH stands for "Dietary Approaches to Stop Hypertension." The DASH eating plan is a healthy eating plan that has been shown to reduce high blood pressure (hypertension). It may also reduce your risk for type 2 diabetes, heart disease, and stroke. The DASH eating plan may also help with weight loss. What are tips for following this plan? General guidelines   Avoid eating more than 2,300 mg (milligrams) of salt (sodium) a day. If you have hypertension, you may need to reduce your sodium intake to 1,500 mg a day.  Limit alcohol intake to no more than 1 drink a day for nonpregnant women and 2 drinks a day for men. One drink equals 12 oz of beer, 5 oz of wine, or 1 oz of hard liquor.  Work with your health care provider to maintain a healthy body weight or to lose weight. Ask what an ideal weight is for you.  Get at least 30 minutes of exercise that causes your heart to beat faster (aerobic exercise) most days of the week. Activities may include walking, swimming, or biking.  Work with your health care provider or diet and nutrition specialist (dietitian) to adjust your eating plan to your individual calorie needs. Reading food labels   Check food labels for the amount of sodium per serving. Choose foods with less than 5 percent of the Daily Value of sodium. Generally, foods with less than 300 mg of sodium per serving fit into this eating plan.  To find whole grains, look for the word "whole" as the first word in the ingredient list. Shopping   Buy products labeled as "low-sodium" or "no salt added."  Buy fresh foods. Avoid canned foods and premade or frozen meals. Cooking   Avoid adding salt when cooking. Use salt-free seasonings or herbs instead of table salt or sea salt. Check with your health care provider or  pharmacist before using salt substitutes.  Do not fry foods. Cook foods using healthy methods such as baking, boiling, grilling, and broiling instead.  Cook with heart-healthy oils, such as olive, canola, soybean, or sunflower oil. Meal planning    Eat a balanced diet that includes:  5 or more servings of fruits and vegetables each day. At each meal, try to fill half of your plate with fruits and vegetables.  Up to 6-8 servings of whole grains each day.  Less than 6 oz of lean meat, poultry, or fish each day. A 3-oz serving of meat is about the same size as a deck of cards. One egg equals 1 oz.  2 servings of low-fat dairy each day.  A serving of nuts, seeds, or beans 5 times each week.  Heart-healthy fats. Healthy fats called Omega-3 fatty acids are found in foods such as flaxseeds and coldwater fish, like sardines, salmon, and mackerel.  Limit how much you eat of the following:  Canned or prepackaged foods.  Food that is high in trans fat, such as fried foods.  Food that is high in saturated fat, such as fatty meat.  Sweets, desserts, sugary drinks, and other foods with added sugar.  Full-fat dairy products.  Do not salt foods before eating.  Try to eat at least 2 vegetarian meals each week.  Eat more home-cooked food and less restaurant, buffet, and fast food.  When eating at a restaurant, ask that your food be prepared with less salt or  no salt, if possible. What foods are recommended? The items listed may not be a complete list. Talk with your dietitian about what dietary choices are best for you. Grains  Whole-grain or whole-wheat bread. Whole-grain or whole-wheat pasta. Brown rice. Modena Morrow. Bulgur. Whole-grain and low-sodium cereals. Pita bread. Low-fat, low-sodium crackers. Whole-wheat flour tortillas. Vegetables  Fresh or frozen vegetables (raw, steamed, roasted, or grilled). Low-sodium or reduced-sodium tomato and vegetable juice. Low-sodium or  reduced-sodium tomato sauce and tomato paste. Low-sodium or reduced-sodium canned vegetables. Fruits  All fresh, dried, or frozen fruit. Canned fruit in natural juice (without added sugar). Meat and other protein foods  Skinless chicken or [place]. Ground chicken or [place]. Pork with fat trimmed off. Fish and seafood. Egg whites. Dried beans, peas, or lentils. Unsalted nuts, nut butters, and seeds. Unsalted canned beans. Lean cuts of beef with fat trimmed off. Low-sodium, lean deli meat. Dairy  Low-fat (1%) or fat-free (skim) milk. Fat-free, low-fat, or reduced-fat cheeses. Nonfat, low-sodium ricotta or cottage cheese. Low-fat or nonfat yogurt. Low-fat, low-sodium cheese. Fats and oils  Soft margarine without trans fats. Vegetable oil. Low-fat, reduced-fat, or light mayonnaise and salad dressings (reduced-sodium). Canola, safflower, olive, soybean, and sunflower oils. Avocado. Seasoning and other foods  Herbs. Spices. Seasoning mixes without salt. Unsalted popcorn and pretzels. Fat-free sweets. What foods are not recommended? The items listed may not be a complete list. Talk with your dietitian about what dietary choices are best for you. Grains  Baked goods made with fat, such as croissants, muffins, or some breads. Dry pasta or rice meal packs. Vegetables  Creamed or fried vegetables. Vegetables in a cheese sauce. Regular canned vegetables (not low-sodium or reduced-sodium). Regular canned tomato sauce and paste (not low-sodium or reduced-sodium). Regular tomato and vegetable juice (not low-sodium or reduced-sodium). Angie Fava. Olives. Fruits  Canned fruit in a light or heavy syrup. Fried fruit. Fruit in cream or butter sauce. Meat and other protein foods  Fatty cuts of meat. Ribs. Fried meat. Berniece Salines. Sausage. Bologna and other processed lunch meats. Salami. Fatback. Hotdogs. Bratwurst. Salted nuts and seeds. Canned beans with added salt. Canned or smoked fish. Whole eggs or egg yolks. Chicken or  [place] with skin. Dairy  Whole or 2% milk, cream, and half-and-half. Whole or full-fat cream cheese. Whole-fat or sweetened yogurt. Full-fat cheese. Nondairy creamers. Whipped toppings. Processed cheese and cheese spreads. Fats and oils  Butter. Stick margarine. Lard. Shortening. Ghee. Bacon fat. Tropical oils, such as coconut, palm kernel, or palm oil. Seasoning and other foods  Salted popcorn and pretzels. Onion salt, garlic salt, seasoned salt, table salt, and sea salt. Worcestershire sauce. Tartar sauce. Barbecue sauce. Teriyaki sauce. Soy sauce, including reduced-sodium. Steak sauce. Canned and packaged gravies. Fish sauce. Oyster sauce. Cocktail sauce. Horseradish that you find on the shelf. Ketchup. Mustard. Meat flavorings and tenderizers. Bouillon cubes. Hot sauce and Tabasco sauce. Premade or packaged marinades. Premade or packaged taco seasonings. Relishes. Regular salad dressings. Where to find more information:  National Heart, Lung, and [place]: https://wilson-eaton.com/  American Heart Association: www.heart.org Summary  The DASH eating plan is a healthy eating plan that has been shown to reduce high blood pressure (hypertension). It may also reduce your risk for type 2 diabetes, heart disease, and stroke.  With the DASH eating plan, you should limit salt (sodium) intake to 2,300 mg a day. If you have hypertension, you may need to reduce your sodium intake to 1,500 mg a day.  When on the DASH eating plan, aim  to eat more fresh fruits and vegetables, whole grains, lean proteins, low-fat dairy, and heart-healthy fats.  Work with your health care provider or diet and nutrition specialist (dietitian) to adjust your eating plan to your individual calorie needs. This information is not intended to replace advice given to you by your health care provider. Make sure you discuss any questions you have with your health care provider. Document Released: 05/05/2011 Document Revised:  05/09/2016 Document Reviewed: 05/09/2016 Elsevier Interactive Patient Education  2017 [employer].

## 2016-09-02 NOTE — Progress Notes (Signed)
Subjective:    Patient ID: Abigail Beasley, female    DOB: 12-Nov-1970, 46 y.o.   MRN: [id number]  HPI Abigail Beasley, a 46 year old female with a history of hypertension presents to establish care. Patient was followed by Dr. Dr. Reginia Forts, but has been lost to follow up.Abigail Beasley has not had blood pressure medications over the past several months. She is not exercising or following a low fat diet. She says that she has gained weight over the past year. She typically east high fat foods that she cooks at home or fast foods on the weekend. She endorses periodic dizziness and heart palpitations, which she attributes to being out of blood pressure medication. She also has some swelling to ankles bilaterally. Patient denies chest pain, dyspnea, fatigue, irregular heart beat and tachypnea.  Cardiovascular risk factors include: hypertension, obesity (BMI >= 30 kg/m2) and sedentary lifestyle.  Patient is also complaining of pelvic pain. She says that she has pelvic pain and heavy menstrual periods over the past year. She last had a pap smear 1 year ago. Pelvic pain is described as intermittent and cramping. She denies pain with sexual intercourse. She also denies urinary frequency, urgency, dysuria, or hesitancy.   Past Medical History:  Diagnosis Date  . Allergy   . Hypertension    Social History   Social History  . Marital status: Single    Spouse name: N/A  . Number of children: N/A  . Years of education: N/A   Occupational History  . Not on file.   Social History Main Topics  . Smoking status: Never Smoker  . Smokeless tobacco: Never Used  . Alcohol use 0.0 oz/week     Comment: per patient once every blue moon  . Drug use: Yes    Types: Marijuana     Comment: occ  . Sexual activity: No   Other Topics Concern  . Not on file   Social History Narrative   Marital status: single; not dating.      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      Tobacco: none      Alcohol:  Socially; rarely.      Drugs: none; marijuana in the past.      Exercise: none      History of incarceration for six years (guilt by association; friend robbed bank).      Sexual activity:  Males; last STD Gonorrhea at age 63.  Syphilis in twenties.    Immunization History  Administered Date(s) Administered  . Hepatitis A, Adult 09/08/2014  . Hepatitis B, adult 09/08/2014  . Influenza Split 02/10/2015  . Influenza-Unspecified 02/27/2014  . Tdap 05/31/2011   Review of Systems  Constitutional: Negative.   HENT: Negative.   Eyes: Negative.   Respiratory: Negative.   Cardiovascular: Negative.  Negative for chest pain, palpitations and leg swelling.  Gastrointestinal: Negative.   Endocrine: Positive for polyuria. Negative for polydipsia and polyphagia.  Genitourinary: Negative.   Musculoskeletal: Positive for back pain.  Skin: Negative.   Allergic/Immunologic: Negative.   Neurological: Negative.   Hematological: Negative.   Psychiatric/Behavioral: Negative.        Objective:   Physical Exam  Constitutional: She appears well-developed and well-nourished.  HENT:  Head: Normocephalic and atraumatic.  Right Ear: External ear normal.  Left Ear: External ear normal.  Nose: Nose normal.  Mouth/Throat: Oropharynx  is clear and moist.  Eyes: Conjunctivae and EOM are normal. Pupils are equal, round, and reactive to light.  Neck: Normal range of motion. Neck supple.  Pulmonary/Chest: Effort normal and breath sounds normal.  Abdominal: Soft. Bowel sounds are normal.  Skin: Skin is warm and dry.  Psychiatric: She has a normal mood and affect. Her behavior is normal. Judgment and thought content normal.      BP (!) 172/96 (BP Location: Left Arm, Patient Position: Sitting, Cuff Size: Large) Comment: manual  Pulse 67   Temp 97.4 F (36.3 C) (Oral)   Resp 16   Ht 5' 7.5" (1.715 m)   Wt 249 lb (112.9  kg)   LMP 08/28/2016   SpO2 100%   BMI 38.42 kg/m  Assessment & Plan:  1. Essential hypertension, benign Patient was given Clonidine 0.1 mg for increased blood pressure. The patient is asked to make an attempt to improve diet and exercise patterns to aid in medical management of this problem. - triamterene-hydrochlorothiazide (MAXZIDE-25) 37.5-25 MG tablet; Take 1 tablet by mouth daily.  Dispense: 30 tablet; Refill: 5 - COMPLETE METABOLIC PANEL WITH GFR - CBC with Differential - TSH - HgB A1c - EKG 12-Lead - cloNIDine (CATAPRES) tablet 0.1 mg; Take 1 tablet (0.1 mg total) by mouth once.  2. Ankle swelling, unspecified laterality Recommend that patient elevate extremities to heart level while at rest.  - levocetirizine (XYZAL) 5 MG tablet; Take 1 tablet (5 mg total) by mouth every evening.  Dispense: 30 tablet; Refill: 5  3. Environmental allergies - levocetirizine (XYZAL) 5 MG tablet; Take 1 tablet (5 mg total) by mouth every evening.  Dispense: 30 tablet; Refill: 5  4. Pelvic pain - US Pelvis Complete; Future - US Transvaginal Non-OB; Future  5. Menorrhagia with regular cycle - US Pelvis Complete; Future - US Transvaginal Non-OB; Future    RTC: 1 month for a follow up of hypertension   [name]  MSN, FNP-C [facility] [place], [place] [address] [phone]

## 2016-09-02 NOTE — Progress Notes (Signed)
Patient was given clonidine 0.1mg  by mouth at 12:30pm and was advised to wait 30 minutes to recheck her blood pressure. Patient left facility without notifying anyone and blood pressure was not re-checked.

## 2016-09-07 ENCOUNTER — Ambulatory Visit (HOSPITAL_COMMUNITY)
Admission: RE | Admit: 2016-09-07 | Discharge: 2016-09-07 | Disposition: A | Discharge status: Home / Self Care | Payer: BLUE CROSS/BLUE SHIELD | Source: Ambulatory Visit | Attending: Family Medicine | Admitting: Family Medicine

## 2016-09-07 ENCOUNTER — Other Ambulatory Visit: Payer: Self-pay | Admitting: Family Medicine

## 2016-09-07 DIAGNOSIS — N83201 Unspecified ovarian cyst, right side: Secondary | ICD-10-CM | POA: Diagnosis not present

## 2016-09-07 DIAGNOSIS — N920 Excessive and frequent menstruation with regular cycle: Secondary | ICD-10-CM | POA: Insufficient documentation

## 2016-09-07 DIAGNOSIS — R102 Pelvic and perineal pain: Secondary | ICD-10-CM | POA: Insufficient documentation

## 2016-09-07 DIAGNOSIS — D259 Leiomyoma of uterus, unspecified: Secondary | ICD-10-CM

## 2016-09-07 DIAGNOSIS — N921 Excessive and frequent menstruation with irregular cycle: Secondary | ICD-10-CM

## 2016-09-07 NOTE — Progress Notes (Signed)
Reviewed pelvic and transvaginal ultrasound. Will send a referral to gynecology for further workup and evaluation.   Abigail Pounds  MSN, FNP-C [facility] [address] [place], [place] [address] [phone]

## 2016-09-14 ENCOUNTER — Encounter: Payer: Self-pay | Admitting: Obstetrics and Gynecology

## 2016-09-14 ENCOUNTER — Ambulatory Visit (INDEPENDENT_AMBULATORY_CARE_PROVIDER_SITE_OTHER): Payer: BLUE CROSS/BLUE SHIELD | Admitting: Obstetrics and Gynecology

## 2016-09-14 VITALS — BP 158/94 | HR 72 | Resp 16 | Ht 67.5 in | Wt 249.0 lb

## 2016-09-14 DIAGNOSIS — Z01812 Encounter for preprocedural laboratory examination: Secondary | ICD-10-CM | POA: Diagnosis not present

## 2016-09-14 DIAGNOSIS — N921 Excessive and frequent menstruation with irregular cycle: Secondary | ICD-10-CM | POA: Diagnosis not present

## 2016-09-14 DIAGNOSIS — D259 Leiomyoma of uterus, unspecified: Secondary | ICD-10-CM | POA: Diagnosis not present

## 2016-09-14 DIAGNOSIS — K429 Umbilical hernia without obstruction or gangrene: Secondary | ICD-10-CM

## 2016-09-14 DIAGNOSIS — N946 Dysmenorrhea, unspecified: Secondary | ICD-10-CM | POA: Diagnosis not present

## 2016-09-14 LAB — POCT URINE PREGNANCY: Preg Test, Ur: NEGATIVE

## 2016-09-14 MED ORDER — NAPROXEN SODIUM 550 MG PO TABS: 550.0000 mg | ORAL_TABLET | Freq: Two times a day (BID) | ORAL | 2 refills | Status: DC

## 2016-09-14 NOTE — Progress Notes (Signed)
46 y.o. [id number] SingleAfrican AmericanF here referred by Cammie Sickle FNP for uterine fibroid and ovarian cyst. Patient c/o dysmenorrhea and menorrhagia. Ultrasound from last week revealed a 15.5 x 12 x 10.5 cm uterus with a 8.5 cm fundal fibroid. Also noted was a 2.1 cm right ovarian cyst, most c/w a physiologic cyst.  Period Duration (Days): 10 days  Period Pattern: (!) Irregular Menstrual Flow: Heavy Menstrual Control: Tampon, Maxi pad Menstrual Control Change Freq (Hours): changes pad and tampons every 1-2 hours  Dysmenorrhea: (!) Severe Dysmenorrhea Symptoms: Cramping  Menses q 2.5-3.5 weeks x 7-10 days. Saturates a super tampon in 30-60 minutes. No BTB. Cramps vary from tolerable to severe. Takes midol, helps sometimes. Cycles have worsened in the last several years.  Sexually active, no pain. No baseline pain. Same partner x 3 years.   Patient's last menstrual period was 08/28/2016.          Sexually active: Yes.    The current method of family planning is tubal ligation.    Exercising: No.  The patient does not participate in regular exercise at present. Smoker:  no  Health Maintenance: Pap:  09-08-14 ASCUS NEG HR HPV History of abnormal Pap:  Yes 2016 ASCUS  MMG:  09-25-14 right breast U/S WNL  Colonoscopy:  Never BMD:   Never TDaP:  2013 Gardasil: N/A   reports that she has never smoked. She has never used smokeless tobacco. She reports that she drinks about 1.8 oz of alcohol per week . She reports that she uses drugs, including Marijuana. She works in housekeeping. She has 2 grown kids and 3 grand kids.   Past Medical History:  Diagnosis Date  . Allergy   . Hypertension     Past Surgical History:  Procedure Laterality Date  . TUBAL LIGATION    PPTL  Current Outpatient Prescriptions  Medication Sig Dispense Refill  . levocetirizine (XYZAL) 5 MG tablet Take 1 tablet (5 mg total) by mouth every evening. 30 tablet 5  . Multiple Vitamin (MULTIVITAMIN WITH MINERALS)  TABS tablet Take 1 tablet by mouth daily.     Marland Kitchen triamterene-hydrochlorothiazide (MAXZIDE-25) 37.5-25 MG tablet Take 1 tablet by mouth daily. 30 tablet 5   No current facility-administered medications for this visit.     Family History  Problem Relation Age of Onset  . Diabetes Mother   . Hypertension Mother   . Cancer Mother     liver   . Hyperlipidemia Mother   . Stroke Mother   . Diabetes Maternal Grandmother   . Hypertension Maternal Grandmother   . Hyperlipidemia Maternal Grandmother   . Heart disease Maternal Grandmother   . Hypertension Sister   . Cancer Maternal Grandfather     prostate  . Diabetes Maternal Grandfather   . Hyperlipidemia Maternal Grandfather   . Hypertension Maternal Grandfather   . Colon cancer Maternal Grandfather   . Diabetes Sister   . Diabetes Maternal Uncle     Review of Systems  Constitutional: Negative.   HENT: Negative.   Eyes: Negative.   Respiratory: Negative.   Cardiovascular: Negative.   Gastrointestinal: Negative.   Endocrine: Negative.   Genitourinary: Positive for menstrual problem.       Heavy painful menstrual cycles   Musculoskeletal: Negative.   Skin: Negative.   Allergic/Immunologic: Negative.   Neurological: Negative.   Psychiatric/Behavioral: Negative.     Exam:   BP (!) 158/94 (BP Location: Right Arm, Patient Position: Sitting, Cuff Size: Large)   Pulse 72  Resp 16   Ht 5' 7.5" (1.715 m)   Wt 249 lb (112.9 kg)   LMP 08/28/2016   BMI 38.42 kg/m   Weight change: @WEIGHTCHANGE @ Height:   Height: 5' 7.5" (171.5 cm)  Ht Readings from Last 3 Encounters:  09/14/16 5' 7.5" (1.715 m)  09/02/16 5' 7.5" (1.715 m)  09/08/14 5' 7.5" (1.715 m)    General appearance: alert, cooperative and appears stated age Head: Normocephalic, without obvious abnormality, atraumatic Neck: no adenopathy, supple, symmetrical, trachea midline and thyroid normal to inspection and palpation Lungs: clear to auscultation  bilaterally Cardiovascular: regular rate and rhythm Abdomen: soft, non-tender; bowel sounds normal; there is a tender mass in her lower abdomen up to the level of her umbilicus, c/w a fibroid uterus, mobile. She has a non-tender umbilical hernia.  Extremities: extremities normal, atraumatic, no cyanosis or edema Skin: Skin color, texture, turgor normal. No rashes or lesions Lymph nodes: Cervical, supraclavicular, and axillary nodes normal. No abnormal inguinal nodes palpated Neurologic: Grossly normal   Pelvic: External genitalia:  no lesions              Urethra:  normal appearing urethra with no masses, tenderness or lesions              Bartholins and Skenes: normal                 Vagina: normal appearing vagina with normal color and discharge, no lesions              Cervix: no lesions               Bimanual Exam:  Uterus:  16-18 week sized, tender, mobile uterus. Not filling her pelvis.              Adnexa: no mass, fullness, tenderness   The risks of endometrial biopsy were reviewed and a consent was obtained.  A speculum was placed in the vagina and the cervix was cleansed with betadine. A tenaculum was placed on the cervix and the mini-pipelle was placed into the endometrial cavity. The uterus sounded to 13+cm. The endometrial biopsy was performed, moderate tissue was obtained. The tenaculum and speculum were removed. There were no complications.                 Chaperone was present for exam.  Recent ultrasound images reviewed with the patient  A:  Menometrorrhagia (review of recent labs, not anemic, normal TSH)  Severe dysmenorrhea  Fibroid uterus  Right ovarian cyst, c/w normal follicle   Umbilical hernia   P:   UPT negative  Endometrial biopsy done  Discussed options for treatment, including trial of micronor, depo-provera, uterine artery embolization and hysterectomy (laparoscopic)  The patient would like a laparoscopic hysterectomy. Would recommend removal of both  tubes as well  Reviewed surgery, including risks and recovery  Discussed trying to coordinate having her umbilical hernia repaired at the time of hysterectomy  Will refer for a surgery consult  Anaprox for pain    CC: Cammie Sickle FNP Letter sent

## 2016-09-14 NOTE — Patient Instructions (Signed)

## 2016-09-15 NOTE — Addendum Note (Signed)
Addended by: Dorothy Spark on: 09/15/2016 09:17 AM   Modules accepted: Orders

## 2016-10-03 ENCOUNTER — Telehealth: Payer: Self-pay | Admitting: *Deleted

## 2016-10-03 NOTE — Telephone Encounter (Signed)
Patient returned call

## 2016-10-03 NOTE — Telephone Encounter (Signed)
Call to patient to follow-up on plans for surgery. Left message to call back.

## 2016-10-03 NOTE — Telephone Encounter (Signed)
Return call to patient. Patient is ready to proceed with first available surgery date. She has seen [facility] and will not be proceeding with hernia repair at this time. She is agreeable to proceed on Monday 10-10-16. Advised will schedule and call her back once confirmed.

## 2016-10-03 NOTE — Telephone Encounter (Signed)
Call to patient to advise surgery scheduled for Monday 10-10-16 at [facility]. Patient states she has decided she now needs to wait about one month and requests to schedule surgery for week of 11-07-16. Advised can move surgery to 11-08-16. Surgery instruction sheet reviewed and printed copy will be mailed to patient.   Routing to provider for final review. Patient agreeable to disposition. Will close encounter.

## 2016-10-19 ENCOUNTER — Encounter: Payer: Self-pay | Admitting: Obstetrics and Gynecology

## 2016-10-19 ENCOUNTER — Other Ambulatory Visit (HOSPITAL_COMMUNITY)
Admission: RE | Admit: 2016-10-19 | Discharge: 2016-10-19 | Disposition: A | Discharge status: Home / Self Care | Payer: BLUE CROSS/BLUE SHIELD | Source: Ambulatory Visit | Attending: Obstetrics and Gynecology | Admitting: Obstetrics and Gynecology

## 2016-10-19 ENCOUNTER — Ambulatory Visit (INDEPENDENT_AMBULATORY_CARE_PROVIDER_SITE_OTHER): Payer: BLUE CROSS/BLUE SHIELD | Admitting: Obstetrics and Gynecology

## 2016-10-19 VITALS — BP 122/70 | HR 80 | Resp 16 | Wt 245.0 lb

## 2016-10-19 DIAGNOSIS — N920 Excessive and frequent menstruation with regular cycle: Secondary | ICD-10-CM

## 2016-10-19 DIAGNOSIS — D259 Leiomyoma of uterus, unspecified: Secondary | ICD-10-CM

## 2016-10-19 DIAGNOSIS — Z124 Encounter for screening for malignant neoplasm of cervix: Secondary | ICD-10-CM | POA: Insufficient documentation

## 2016-10-19 DIAGNOSIS — N946 Dysmenorrhea, unspecified: Secondary | ICD-10-CM

## 2016-10-19 NOTE — Progress Notes (Signed)
GYNECOLOGY  VISIT   HPI: 46 y.o.   Single  African American  female   [phone] with Patient's last menstrual period was 10/17/2016.   here for pre surgery consult. Patient is scheduled for TLH on 11-08-16  For a symptomatic fibroid uterus. On ultrasound her uterus measures 15.5 x 12 x 10.5 cm. Not anemic, normal TSH. Endometrial biopsy was negative.  Last pap was in 4/16, ASCUS, negative HPV.  Sexually active, no pain. Same long term partner.   She works as a Chartered certified accountant.   GYNECOLOGIC HISTORY: Patient's last menstrual period was 10/17/2016. Contraception: tubal ligation  Menopausal hormone therapy: none        OB History    Gravida Para Term Preterm AB Living   4 2 2   2 2    SAB TAB Ectopic Multiple Live Births   1       2         Patient Active Problem List   Diagnosis Date Noted  . Severe obesity (BMI >= 40) ([place]) 07/28/2014  . Dizziness and giddiness 03/18/2013  . Essential hypertension, benign 03/18/2013  . Breast pain, left 03/18/2013    Past Medical History:  Diagnosis Date  . Allergy   . Hypertension     Past Surgical History:  Procedure Laterality Date  . TUBAL LIGATION      Current Outpatient Prescriptions  Medication Sig Dispense Refill  . levocetirizine (XYZAL) 5 MG tablet Take 1 tablet (5 mg total) by mouth every evening. 30 tablet 5  . naproxen sodium (ANAPROX DS) 550 MG tablet Take 1 tablet (550 mg total) by mouth 2 (two) times daily with a meal. 30 tablet 2  . triamterene-hydrochlorothiazide (MAXZIDE-25) 37.5-25 MG tablet Take 1 tablet by mouth daily. 30 tablet 5  . Multiple Vitamin (MULTIVITAMIN WITH MINERALS) TABS tablet Take 1 tablet by mouth daily.      No current facility-administered medications for this visit.      ALLERGIES: Patient has no known allergies.  Family History  Problem Relation Age of Onset  . Diabetes Mother   . Hypertension Mother   . Cancer Mother        liver   . Hyperlipidemia Mother   . Stroke Mother   . Diabetes  Maternal Grandmother   . Hypertension Maternal Grandmother   . Hyperlipidemia Maternal Grandmother   . Heart disease Maternal Grandmother   . Hypertension Sister   . Cancer Maternal Grandfather        prostate  . Diabetes Maternal Grandfather   . Hyperlipidemia Maternal Grandfather   . Hypertension Maternal Grandfather   . Colon cancer Maternal Grandfather   . Diabetes Sister   . Diabetes Maternal Uncle     Social History   Social History  . Marital status: Single    Spouse name: N/A  . Number of children: N/A  . Years of education: N/A   Occupational History  . Not on file.   Social History Main Topics  . Smoking status: Never Smoker  . Smokeless tobacco: Never Used  . Alcohol use 1.8 oz/week    3 Standard drinks or equivalent per week  . Drug use: Yes    Types: Marijuana     Comment: occ  . Sexual activity: Yes    Partners: Male    Birth control/ protection: Surgical   Other Topics Concern  . Not on file   Social History Narrative   Marital status: single; not dating.  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      Tobacco: none      Alcohol:  Socially; rarely.      Drugs: none; marijuana in the past.      Exercise: none      History of incarceration for six years (guilt by association; friend robbed bank).      Sexual activity:  Males; last STD Gonorrhea at age 13.  Syphilis in twenties.     Review of Systems  Constitutional: Negative.   HENT: Negative.   Eyes: Negative.   Respiratory: Negative.   Cardiovascular: Negative.   Gastrointestinal: Negative.   Genitourinary:       Heavy menstrual bleeding   Musculoskeletal: Negative.   Skin: Negative.   Neurological: Negative.   Endo/Heme/Allergies: Negative.   Psychiatric/Behavioral: Negative.     PHYSICAL EXAMINATION:    BP 122/70 (BP Location: Right Arm, Patient Position: Sitting, Cuff Size: Normal)    Pulse 80   Resp 16   Wt 245 lb (111.1 kg)   LMP 10/17/2016   BMI 37.81 kg/m     General appearance: alert, cooperative and appears stated age Neck: no adenopathy, supple, symmetrical, trachea midline and thyroid normal to inspection and palpation Heart: regular rate and rhythm Lungs: CTAB Abdomen: soft, non-tender; bowel sounds normal; no masses,  no organomegaly. She has an umbilical hernia.  Extremities: normal, atraumatic, no cyanosis Skin: normal color, texture and turgor, no rashes or lesions Lymph: normal cervical supraclavicular and inguinal nodes Neurologic: grossly normal   Pelvic: External genitalia:  no lesions              Urethra:  normal appearing urethra with no masses, tenderness or lesions              Bartholins and Skenes: normal                 Vagina: normal appearing vagina with normal color and discharge, no lesions              Cervix: no lesions              Bimanual Exam:  Uterus:  16 week sized, anteverted, mobile, tender uterus              Adnexa: no mass, fullness, tenderness                Chaperone was present for exam.  ASSESSMENT Symptomatic fibroid uterus, negative biopsy, not anemic Umbilical hernia, will be repaired at another time.  PLAN Will send pap with hpv Discussed total laparoscopic hysterectomy, bilateral salpingectomy and cystoscopy. Reviewed the risks of the procedure, including infection, bleeding, damage to bowel/badder/vessels/ureters.  Discussed the possible need for laparotomy. Discussed post operative recovery and risk of cuff dehiscence. All of her questions were answered      An After Visit Summary was printed and given to the patient.  CC: Cammie Sickle, FNP

## 2016-10-20 ENCOUNTER — Ambulatory Visit: Payer: BLUE CROSS/BLUE SHIELD | Admitting: Family Medicine

## 2016-10-21 LAB — CYTOLOGY - PAP
Diagnosis: NEGATIVE
HPV (WINDOPATH): NOT DETECTED

## 2016-10-31 ENCOUNTER — Encounter (HOSPITAL_COMMUNITY): Payer: Self-pay

## 2016-10-31 ENCOUNTER — Encounter (HOSPITAL_COMMUNITY)
Admission: RE | Admit: 2016-10-31 | Discharge: 2016-10-31 | Disposition: A | Discharge status: Home / Self Care | Payer: BLUE CROSS/BLUE SHIELD | Source: Ambulatory Visit | Attending: Obstetrics and Gynecology | Admitting: Obstetrics and Gynecology

## 2016-10-31 DIAGNOSIS — R42 Dizziness and giddiness: Secondary | ICD-10-CM | POA: Diagnosis not present

## 2016-10-31 DIAGNOSIS — N644 Mastodynia: Secondary | ICD-10-CM | POA: Insufficient documentation

## 2016-10-31 DIAGNOSIS — Z01812 Encounter for preprocedural laboratory examination: Secondary | ICD-10-CM | POA: Insufficient documentation

## 2016-10-31 DIAGNOSIS — I10 Essential (primary) hypertension: Secondary | ICD-10-CM | POA: Diagnosis not present

## 2016-10-31 HISTORY — DX: Other specified postprocedural states: Z98.890

## 2016-10-31 HISTORY — DX: Other specified postprocedural states: R11.2

## 2016-10-31 LAB — COMPREHENSIVE METABOLIC PANEL
ALT: 13 U/L — AB (ref 14–54)
AST: 14 U/L — AB (ref 15–41)
Albumin: 3.8 g/dL (ref 3.5–5.0)
Alkaline Phosphatase: 56 U/L (ref 38–126)
Anion gap: 7 (ref 5–15)
BUN: 20 mg/dL (ref 6–20)
CHLORIDE: 104 mmol/L (ref 101–111)
CO2: 25 mmol/L (ref 22–32)
CREATININE: 1.3 mg/dL — AB (ref 0.44–1.00)
Calcium: 9.3 mg/dL (ref 8.9–10.3)
GFR calc Af Amer: 56 mL/min — ABNORMAL LOW (ref >60)
GFR calc non Af Amer: 48 mL/min — ABNORMAL LOW (ref >60)
Glucose, Bld: 90 mg/dL (ref 65–99)
Potassium: 4.3 mmol/L (ref 3.5–5.1)
Sodium: 136 mmol/L (ref 135–145)
Total Bilirubin: 0.2 mg/dL — ABNORMAL LOW (ref 0.3–1.2)
Total Protein: 7.2 g/dL (ref 6.5–8.1)

## 2016-10-31 LAB — CBC
HEMATOCRIT: 40.6 % (ref 36.0–46.0)
Hemoglobin: 13.3 g/dL (ref 12.0–15.0)
MCH: 29 pg (ref 26.0–34.0)
MCHC: 32.8 g/dL (ref 30.0–36.0)
MCV: 88.6 fL (ref 78.0–100.0)
PLATELETS: 304 10*3/uL (ref 150–400)
RBC: 4.58 MIL/uL (ref 3.87–5.11)
RDW: 13.8 % (ref 11.5–15.5)
WBC: 7 10*3/uL (ref 4.0–10.5)

## 2016-10-31 NOTE — Patient Instructions (Signed)
Your procedure is scheduled on:  Tuesday, November 08, 2016  Enter through the Main Entrance of [facility] at:  6:00 AM  Pick up the phone at the desk and dial [phone].  Call this number if you have problems the morning of surgery: [phone].  Remember: Do NOT eat food or drink after:  Midnight Monday  Take these medicines the morning of surgery with a SIP OF WATER:  Triamterene  Stop ALL herbal medications at this time  Do NOT smoke the day of surgery.  Do NOT wear jewelry (body piercing), metal hair clips/bobby pins, make-up, or nail polish. Do NOT wear lotions, powders, or perfumes.  You may wear deodorant. Do NOT shave for 48 hours prior to surgery. Do NOT bring valuables to the hospital. Contacts, dentures, or bridgework may not be worn into surgery.  Leave suitcase in car.  After surgery it may be brought to your room.  For patients admitted to the hospital, checkout time is 11:00 AM the day of discharge.  Bring a copy of your healthcare power of attorney and living will documents.

## 2016-11-02 ENCOUNTER — Telehealth: Payer: Self-pay | Admitting: *Deleted

## 2016-11-02 NOTE — Telephone Encounter (Signed)
-----   Message from Salvadore Dom, MD sent at 10/31/2016  4:16 PM EDT ----- Her creatinine was mildly elevated, hasn't been in the past. This can happen just from being very dehydrated. She should have a repeat creatine when well hydrated. If she can do it prior to her surgery, that would be great. If not, she can wait until her surgery.

## 2016-11-02 NOTE — Telephone Encounter (Signed)
Patient returning call.

## 2016-11-02 NOTE — Telephone Encounter (Signed)
Returned patients call- left message to call back -eh

## 2016-11-02 NOTE — Telephone Encounter (Signed)
Left message to call regarding results -eh 

## 2016-11-02 NOTE — Telephone Encounter (Signed)
Returning a call to Elaine.  °

## 2016-11-02 NOTE — Telephone Encounter (Signed)
Spoke with patient- see results note -eh

## 2016-11-03 ENCOUNTER — Telehealth: Payer: Self-pay | Admitting: Obstetrics and Gynecology

## 2016-11-03 NOTE — Telephone Encounter (Signed)
Patient returning a call to Elaine. °

## 2016-11-07 ENCOUNTER — Other Ambulatory Visit (INDEPENDENT_AMBULATORY_CARE_PROVIDER_SITE_OTHER): Payer: BLUE CROSS/BLUE SHIELD

## 2016-11-07 DIAGNOSIS — R748 Abnormal levels of other serum enzymes: Secondary | ICD-10-CM

## 2016-11-07 NOTE — Telephone Encounter (Signed)
Spoke with patient - patient just double checking time for blood work -Production designer, theatre/television/film

## 2016-11-08 ENCOUNTER — Ambulatory Visit (HOSPITAL_COMMUNITY): Payer: BLUE CROSS/BLUE SHIELD | Admitting: Anesthesiology

## 2016-11-08 ENCOUNTER — Encounter (HOSPITAL_COMMUNITY)
Admission: RE | Disposition: A | Discharge status: Home / Self Care | Payer: Self-pay | Source: Ambulatory Visit | Attending: Obstetrics and Gynecology

## 2016-11-08 ENCOUNTER — Ambulatory Visit (HOSPITAL_COMMUNITY)
Admission: RE | Admit: 2016-11-08 | Discharge: 2016-11-09 | Disposition: A | Discharge status: Home / Self Care | Payer: BLUE CROSS/BLUE SHIELD | Source: Ambulatory Visit | Attending: Obstetrics and Gynecology | Admitting: Obstetrics and Gynecology

## 2016-11-08 ENCOUNTER — Encounter (HOSPITAL_COMMUNITY): Payer: Self-pay

## 2016-11-08 DIAGNOSIS — N800 Endometriosis of uterus: Secondary | ICD-10-CM | POA: Insufficient documentation

## 2016-11-08 DIAGNOSIS — Z6839 Body mass index (BMI) 39.0-39.9, adult: Secondary | ICD-10-CM | POA: Diagnosis not present

## 2016-11-08 DIAGNOSIS — N838 Other noninflammatory disorders of ovary, fallopian tube and broad ligament: Secondary | ICD-10-CM | POA: Insufficient documentation

## 2016-11-08 DIAGNOSIS — Z823 Family history of stroke: Secondary | ICD-10-CM | POA: Insufficient documentation

## 2016-11-08 DIAGNOSIS — Z833 Family history of diabetes mellitus: Secondary | ICD-10-CM | POA: Diagnosis not present

## 2016-11-08 DIAGNOSIS — I10 Essential (primary) hypertension: Secondary | ICD-10-CM | POA: Insufficient documentation

## 2016-11-08 DIAGNOSIS — D251 Intramural leiomyoma of uterus: Secondary | ICD-10-CM | POA: Diagnosis not present

## 2016-11-08 DIAGNOSIS — N920 Excessive and frequent menstruation with regular cycle: Secondary | ICD-10-CM | POA: Diagnosis not present

## 2016-11-08 DIAGNOSIS — Z800 Family history of malignant neoplasm of digestive organs: Secondary | ICD-10-CM | POA: Diagnosis not present

## 2016-11-08 DIAGNOSIS — N946 Dysmenorrhea, unspecified: Secondary | ICD-10-CM | POA: Insufficient documentation

## 2016-11-08 DIAGNOSIS — N921 Excessive and frequent menstruation with irregular cycle: Secondary | ICD-10-CM | POA: Diagnosis not present

## 2016-11-08 DIAGNOSIS — K429 Umbilical hernia without obstruction or gangrene: Secondary | ICD-10-CM | POA: Insufficient documentation

## 2016-11-08 DIAGNOSIS — Z8249 Family history of ischemic heart disease and other diseases of the circulatory system: Secondary | ICD-10-CM | POA: Diagnosis not present

## 2016-11-08 DIAGNOSIS — Z8042 Family history of malignant neoplasm of prostate: Secondary | ICD-10-CM | POA: Insufficient documentation

## 2016-11-08 DIAGNOSIS — D259 Leiomyoma of uterus, unspecified: Secondary | ICD-10-CM | POA: Diagnosis not present

## 2016-11-08 DIAGNOSIS — Z90710 Acquired absence of both cervix and uterus: Secondary | ICD-10-CM | POA: Diagnosis present

## 2016-11-08 HISTORY — PX: TOTAL LAPAROSCOPIC HYSTERECTOMY WITH SALPINGECTOMY: SHX6742

## 2016-11-08 HISTORY — PX: CYSTOSCOPY: SHX5120

## 2016-11-08 LAB — COMPREHENSIVE METABOLIC PANEL
A/G RATIO: 1.5 (ref 1.2–2.2)
ALK PHOS: 56 IU/L (ref 39–117)
ALT: 12 IU/L (ref 0–32)
AST: 18 IU/L (ref 0–40)
Albumin: 3.6 g/dL (ref 3.5–5.5)
BILIRUBIN TOTAL: 0.3 mg/dL (ref 0.0–1.2)
BUN/Creatinine Ratio: 13 (ref 9–23)
BUN: 16 mg/dL (ref 6–24)
CHLORIDE: 108 mmol/L — AB (ref 96–106)
CO2: 19 mmol/L — ABNORMAL LOW (ref 20–29)
Calcium: 9.1 mg/dL (ref 8.7–10.2)
Creatinine, Ser: 1.24 mg/dL — ABNORMAL HIGH (ref 0.57–1.00)
GFR calc Af Amer: 60 mL/min/{1.73_m2} (ref >59)
GFR calc non Af Amer: 52 mL/min/{1.73_m2} — ABNORMAL LOW (ref >59)
GLOBULIN, TOTAL: 2.4 g/dL (ref 1.5–4.5)
Glucose: 144 mg/dL — ABNORMAL HIGH (ref 65–99)
POTASSIUM: 4 mmol/L (ref 3.5–5.2)
SODIUM: 141 mmol/L (ref 134–144)
Total Protein: 6 g/dL (ref 6.0–8.5)

## 2016-11-08 SURGERY — HYSTERECTOMY, TOTAL, LAPAROSCOPIC, WITH SALPINGECTOMY
Anesthesia: General | Site: Bladder

## 2016-11-08 MED ORDER — ENOXAPARIN SODIUM 40 MG/0.4ML ~~LOC~~ SOLN
40.0000 mg | SUBCUTANEOUS | Status: DC
  Administered 2016-11-09: 40 mg via SUBCUTANEOUS
  Filled 2016-11-08: qty 0.4

## 2016-11-08 MED ORDER — SOD CITRATE-CITRIC ACID 500-334 MG/5ML PO SOLN
ORAL | Status: AC
  Administered 2016-11-08: 30 mL via ORAL
  Filled 2016-11-08: qty 15

## 2016-11-08 MED ORDER — LIDOCAINE HCL (CARDIAC) 20 MG/ML IV SOLN
INTRAVENOUS | Status: AC
  Filled 2016-11-08: qty 5

## 2016-11-08 MED ORDER — STERILE WATER FOR IRRIGATION IR SOLN
Status: DC | PRN
  Administered 2016-11-08: 1000 mL

## 2016-11-08 MED ORDER — DEXAMETHASONE SODIUM PHOSPHATE 4 MG/ML IJ SOLN
INTRAMUSCULAR | Status: DC | PRN
  Administered 2016-11-08: 4 mg via INTRAVENOUS

## 2016-11-08 MED ORDER — LIDOCAINE HCL (CARDIAC) 20 MG/ML IV SOLN
INTRAVENOUS | Status: DC | PRN
  Administered 2016-11-08: 80 mg via INTRAVENOUS

## 2016-11-08 MED ORDER — HYDROMORPHONE HCL 1 MG/ML IJ SOLN
INTRAMUSCULAR | Status: DC | PRN
  Administered 2016-11-08: 1 mg via INTRAVENOUS

## 2016-11-08 MED ORDER — HYDROMORPHONE HCL 1 MG/ML IJ SOLN
INTRAMUSCULAR | Status: AC
  Filled 2016-11-08: qty 1

## 2016-11-08 MED ORDER — GABAPENTIN 300 MG PO CAPS
ORAL_CAPSULE | ORAL | Status: AC
  Administered 2016-11-08: 300 mg via ORAL
  Filled 2016-11-08: qty 1

## 2016-11-08 MED ORDER — ONDANSETRON HCL 4 MG/2ML IJ SOLN
INTRAMUSCULAR | Status: AC
  Filled 2016-11-08: qty 2

## 2016-11-08 MED ORDER — ROCURONIUM BROMIDE 100 MG/10ML IV SOLN
INTRAVENOUS | Status: AC
  Filled 2016-11-08: qty 1

## 2016-11-08 MED ORDER — FENTANYL CITRATE (PF) 250 MCG/5ML IJ SOLN
INTRAMUSCULAR | Status: AC
Start: 2016-11-08 — End: 2016-11-08
  Filled 2016-11-08: qty 5

## 2016-11-08 MED ORDER — ACETAMINOPHEN 325 MG PO TABS
650.0000 mg | ORAL_TABLET | ORAL | Status: DC | PRN
  Administered 2016-11-09: 650 mg via ORAL
  Filled 2016-11-08: qty 2

## 2016-11-08 MED ORDER — CELECOXIB 200 MG PO CAPS
ORAL_CAPSULE | ORAL | Status: AC
  Administered 2016-11-08: 200 mg via ORAL
  Filled 2016-11-08: qty 1

## 2016-11-08 MED ORDER — MIDAZOLAM HCL 2 MG/2ML IJ SOLN
INTRAMUSCULAR | Status: AC
  Filled 2016-11-08: qty 2

## 2016-11-08 MED ORDER — GLYCOPYRROLATE 0.2 MG/ML IJ SOLN
INTRAMUSCULAR | Status: AC
  Filled 2016-11-08: qty 2

## 2016-11-08 MED ORDER — ACETAMINOPHEN 500 MG PO TABS
1000.0000 mg | ORAL_TABLET | Freq: Once | ORAL | Status: AC
  Administered 2016-11-08: 1000 mg via ORAL

## 2016-11-08 MED ORDER — ROCURONIUM BROMIDE 100 MG/10ML IV SOLN
INTRAVENOUS | Status: DC | PRN
  Administered 2016-11-08 (×6): 10 mg via INTRAVENOUS
  Administered 2016-11-08: 40 mg via INTRAVENOUS

## 2016-11-08 MED ORDER — CELECOXIB 200 MG PO CAPS
200.0000 mg | ORAL_CAPSULE | Freq: Once | ORAL | Status: AC
  Administered 2016-11-08: 200 mg via ORAL

## 2016-11-08 MED ORDER — KETOROLAC TROMETHAMINE 30 MG/ML IJ SOLN: 30.0000 mg | Freq: Four times a day (QID) | INTRAMUSCULAR | Status: DC

## 2016-11-08 MED ORDER — PROPOFOL 10 MG/ML IV BOLUS
INTRAVENOUS | Status: AC
  Filled 2016-11-08: qty 20

## 2016-11-08 MED ORDER — SODIUM CHLORIDE 0.9 % IJ SOLN
INTRAMUSCULAR | Status: AC
  Filled 2016-11-08: qty 10

## 2016-11-08 MED ORDER — ROPIVACAINE HCL 5 MG/ML IJ SOLN
INTRAMUSCULAR | Status: AC
  Filled 2016-11-08: qty 30

## 2016-11-08 MED ORDER — CEFOTETAN DISODIUM-DEXTROSE 2-2.08 GM-% IV SOLR
INTRAVENOUS | Status: AC
  Filled 2016-11-08: qty 50

## 2016-11-08 MED ORDER — PROMETHAZINE HCL 25 MG/ML IJ SOLN: 6.2500 mg | INTRAMUSCULAR | Status: DC | PRN

## 2016-11-08 MED ORDER — ZOLPIDEM TARTRATE 5 MG PO TABS
5.0000 mg | ORAL_TABLET | Freq: Every evening | ORAL | Status: DC | PRN
  Administered 2016-11-09: 5 mg via ORAL
  Filled 2016-11-08: qty 1

## 2016-11-08 MED ORDER — KETOROLAC TROMETHAMINE 30 MG/ML IJ SOLN
15.0000 mg | Freq: Four times a day (QID) | INTRAMUSCULAR | Status: AC
  Administered 2016-11-08 – 2016-11-09 (×4): 15 mg via INTRAVENOUS
  Filled 2016-11-08 (×3): qty 1

## 2016-11-08 MED ORDER — SUGAMMADEX SODIUM 200 MG/2ML IV SOLN
INTRAVENOUS | Status: AC
  Filled 2016-11-08: qty 2

## 2016-11-08 MED ORDER — KETOROLAC TROMETHAMINE 30 MG/ML IJ SOLN
INTRAMUSCULAR | Status: AC
  Administered 2016-11-08: 15 mg via INTRAVENOUS
  Filled 2016-11-08: qty 1

## 2016-11-08 MED ORDER — PROPOFOL 10 MG/ML IV BOLUS
INTRAVENOUS | Status: DC | PRN
  Administered 2016-11-08: 160 mg via INTRAVENOUS

## 2016-11-08 MED ORDER — ENOXAPARIN SODIUM 40 MG/0.4ML ~~LOC~~ SOLN
40.0000 mg | SUBCUTANEOUS | Status: AC
  Administered 2016-11-08: 40 mg via SUBCUTANEOUS
  Filled 2016-11-08: qty 0.4

## 2016-11-08 MED ORDER — SCOPOLAMINE 1 MG/3DAYS TD PT72
MEDICATED_PATCH | TRANSDERMAL | Status: DC
Start: 2016-11-08 — End: 2016-11-09
  Administered 2016-11-08: 1.5 mg via TRANSDERMAL
  Filled 2016-11-08: qty 1

## 2016-11-08 MED ORDER — BUPIVACAINE HCL (PF) 0.25 % IJ SOLN
INTRAMUSCULAR | Status: DC | PRN
  Administered 2016-11-08: 15 mL

## 2016-11-08 MED ORDER — FENTANYL CITRATE (PF) 100 MCG/2ML IJ SOLN
INTRAMUSCULAR | Status: DC | PRN
  Administered 2016-11-08 (×7): 50 ug via INTRAVENOUS

## 2016-11-08 MED ORDER — ACETAMINOPHEN 500 MG PO TABS
1000.0000 mg | ORAL_TABLET | Freq: Once | ORAL | Status: AC
  Administered 2016-11-08: 1000 mg via ORAL
  Filled 2016-11-08: qty 2

## 2016-11-08 MED ORDER — SODIUM CHLORIDE 0.9 % IV SOLN
INTRAVENOUS | Status: DC | PRN
  Administered 2016-11-08: 60 mL

## 2016-11-08 MED ORDER — LIDOCAINE HCL 1 % IJ SOLN
INTRAMUSCULAR | Status: AC
  Filled 2016-11-08: qty 10

## 2016-11-08 MED ORDER — LACTATED RINGERS IV SOLN
INTRAVENOUS | Status: DC
  Administered 2016-11-08 (×3): via INTRAVENOUS

## 2016-11-08 MED ORDER — CEFOTETAN DISODIUM-DEXTROSE 2-2.08 GM-% IV SOLR
2.0000 g | INTRAVENOUS | Status: AC
  Administered 2016-11-08: 2 g via INTRAVENOUS

## 2016-11-08 MED ORDER — BUPIVACAINE HCL (PF) 0.25 % IJ SOLN
INTRAMUSCULAR | Status: AC
  Filled 2016-11-08: qty 30

## 2016-11-08 MED ORDER — SUGAMMADEX SODIUM 200 MG/2ML IV SOLN
INTRAVENOUS | Status: DC | PRN
Start: 2016-11-08 — End: 2016-11-08
  Administered 2016-11-08: 200 mg via INTRAVENOUS

## 2016-11-08 MED ORDER — SCOPOLAMINE 1 MG/3DAYS TD PT72
1.0000 | MEDICATED_PATCH | Freq: Once | TRANSDERMAL | Status: DC
  Administered 2016-11-08: 1.5 mg via TRANSDERMAL

## 2016-11-08 MED ORDER — ALUM & MAG HYDROXIDE-SIMETH 200-200-20 MG/5ML PO SUSP: 30.0000 mL | ORAL | Status: DC | PRN

## 2016-11-08 MED ORDER — ACETAMINOPHEN 500 MG PO TABS
ORAL_TABLET | ORAL | Status: AC
  Administered 2016-11-08: 1000 mg via ORAL
  Filled 2016-11-08: qty 2

## 2016-11-08 MED ORDER — ARTIFICIAL TEARS OPHTHALMIC OINT
TOPICAL_OINTMENT | OPHTHALMIC | Status: AC
  Filled 2016-11-08: qty 3.5

## 2016-11-08 MED ORDER — DEXAMETHASONE SODIUM PHOSPHATE 4 MG/ML IJ SOLN
INTRAMUSCULAR | Status: AC
  Filled 2016-11-08: qty 1

## 2016-11-08 MED ORDER — MENTHOL 3 MG MT LOZG: 1.0000 | LOZENGE | OROMUCOSAL | Status: DC | PRN

## 2016-11-08 MED ORDER — DOCUSATE SODIUM 100 MG PO CAPS
100.0000 mg | ORAL_CAPSULE | Freq: Two times a day (BID) | ORAL | Status: DC
  Administered 2016-11-08 – 2016-11-09 (×2): 100 mg via ORAL
  Filled 2016-11-08 (×2): qty 1

## 2016-11-08 MED ORDER — ONDANSETRON HCL 4 MG/2ML IJ SOLN
4.0000 mg | Freq: Four times a day (QID) | INTRAMUSCULAR | Status: DC | PRN
  Administered 2016-11-08: 4 mg via INTRAVENOUS
  Filled 2016-11-08: qty 2

## 2016-11-08 MED ORDER — TRAMADOL HCL 50 MG PO TABS
50.0000 mg | ORAL_TABLET | Freq: Four times a day (QID) | ORAL | Status: DC | PRN
  Administered 2016-11-08: 50 mg via ORAL
  Filled 2016-11-08: qty 1

## 2016-11-08 MED ORDER — PHENYLEPHRINE HCL 10 MG/ML IJ SOLN
INTRAMUSCULAR | Status: DC | PRN
  Administered 2016-11-08 (×2): 40 ug via INTRAVENOUS
  Administered 2016-11-08: 80 ug via INTRAVENOUS

## 2016-11-08 MED ORDER — HYDROMORPHONE HCL 1 MG/ML IJ SOLN
0.2000 mg | INTRAMUSCULAR | Status: DC | PRN
  Administered 2016-11-08: 0.6 mg via INTRAVENOUS
  Administered 2016-11-08: 0.4 mg via INTRAVENOUS
  Filled 2016-11-08 (×2): qty 1

## 2016-11-08 MED ORDER — FENTANYL CITRATE (PF) 100 MCG/2ML IJ SOLN: 25.0000 ug | INTRAMUSCULAR | Status: DC | PRN

## 2016-11-08 MED ORDER — SODIUM CHLORIDE 0.9 % IJ SOLN
INTRAMUSCULAR | Status: AC
  Filled 2016-11-08: qty 20

## 2016-11-08 MED ORDER — ONDANSETRON HCL 4 MG PO TABS: 4.0000 mg | ORAL_TABLET | Freq: Four times a day (QID) | ORAL | Status: DC | PRN

## 2016-11-08 MED ORDER — SOD CITRATE-CITRIC ACID 500-334 MG/5ML PO SOLN
30.0000 mL | ORAL | Status: AC
  Administered 2016-11-08: 30 mL via ORAL

## 2016-11-08 MED ORDER — PANTOPRAZOLE SODIUM 40 MG IV SOLR
40.0000 mg | Freq: Every day | INTRAVENOUS | Status: DC
  Administered 2016-11-09: 40 mg via INTRAVENOUS
  Filled 2016-11-08: qty 40

## 2016-11-08 MED ORDER — MIDAZOLAM HCL 2 MG/2ML IJ SOLN
INTRAMUSCULAR | Status: DC | PRN
  Administered 2016-11-08: 2 mg via INTRAVENOUS

## 2016-11-08 MED ORDER — TRIAMTERENE-HCTZ 37.5-25 MG PO TABS
1.0000 | ORAL_TABLET | Freq: Every day | ORAL | Status: DC
  Administered 2016-11-09: 1 via ORAL
  Filled 2016-11-08: qty 1

## 2016-11-08 MED ORDER — KCL IN DEXTROSE-NACL 20-5-0.45 MEQ/L-%-% IV SOLN
INTRAVENOUS | Status: DC
  Administered 2016-11-08: 16:00:00 via INTRAVENOUS
  Filled 2016-11-08 (×2): qty 1000

## 2016-11-08 MED ORDER — ONDANSETRON HCL 4 MG/2ML IJ SOLN
INTRAMUSCULAR | Status: DC | PRN
  Administered 2016-11-08: 4 mg via INTRAVENOUS

## 2016-11-08 MED ORDER — FENTANYL CITRATE (PF) 100 MCG/2ML IJ SOLN
INTRAMUSCULAR | Status: AC
  Filled 2016-11-08: qty 2

## 2016-11-08 MED ORDER — GABAPENTIN 300 MG PO CAPS
300.0000 mg | ORAL_CAPSULE | Freq: Once | ORAL | Status: AC
  Administered 2016-11-08: 300 mg via ORAL

## 2016-11-08 MED ORDER — NEOSTIGMINE METHYLSULFATE 10 MG/10ML IV SOLN
INTRAVENOUS | Status: AC
  Filled 2016-11-08: qty 1

## 2016-11-08 SURGICAL SUPPLY — 53 items
APPLICATOR ARISTA FLEXITIP XL (MISCELLANEOUS) IMPLANT
BLADE SURG 10 STRL SS (BLADE) ×16 IMPLANT
CANISTER SUCT 3000ML PPV (MISCELLANEOUS) ×4 IMPLANT
CLOTH BEACON ORANGE TIMEOUT ST (SAFETY) ×4 IMPLANT
COVER LIGHT HANDLE  1/PK (MISCELLANEOUS) ×4
COVER LIGHT HANDLE 1/PK (MISCELLANEOUS) ×4 IMPLANT
COVER MAYO STAND STRL (DRAPES) ×4 IMPLANT
DECANTER SPIKE VIAL GLASS SM (MISCELLANEOUS) ×12 IMPLANT
DERMABOND ADVANCED (GAUZE/BANDAGES/DRESSINGS) ×2
DERMABOND ADVANCED .7 DNX12 (GAUZE/BANDAGES/DRESSINGS) ×2 IMPLANT
DRAPE LG THREE QUARTER DISP (DRAPES) ×4 IMPLANT
DURAPREP 26ML APPLICATOR (WOUND CARE) ×4 IMPLANT
EXTRT SYSTEM ALEXIS 17CM (MISCELLANEOUS) ×4
GLOVE BIO SURGEON STRL SZ 6.5 (GLOVE) ×3 IMPLANT
GLOVE BIO SURGEONS STRL SZ 6.5 (GLOVE) ×1
GLOVE BIOGEL PI IND STRL 7.0 (GLOVE) ×8 IMPLANT
GLOVE BIOGEL PI INDICATOR 7.0 (GLOVE) ×8
GOWN STRL REUS W/TWL LRG LVL3 (GOWN DISPOSABLE) ×16 IMPLANT
HARMONIC RUM II 3.0CM SILVER (DISPOSABLE) ×4
HEMOSTAT ARISTA ABSORB 3G PWDR (MISCELLANEOUS) IMPLANT
LEGGING LITHOTOMY PAIR STRL (DRAPES) ×4 IMPLANT
LIGASURE VESSEL 5MM BLUNT TIP (ELECTROSURGICAL) ×4 IMPLANT
NEEDLE INSUFFLATION 120MM (ENDOMECHANICALS) ×4 IMPLANT
OCCLUDER COLPOPNEUMO (BALLOONS) ×4 IMPLANT
PACK LAPAROSCOPY BASIN (CUSTOM PROCEDURE TRAY) ×4 IMPLANT
PACK TRENDGUARD 600 HYBRD PROC (MISCELLANEOUS) ×2 IMPLANT
POUCH LAPAROSCOPIC INSTRUMENT (MISCELLANEOUS) ×4 IMPLANT
PROTECTOR NERVE ULNAR (MISCELLANEOUS) ×8 IMPLANT
SCALPEL HRMNC RUM II 3.0 SILVR (DISPOSABLE) ×2 IMPLANT
SCISSORS LAP 5X35 DISP (ENDOMECHANICALS) ×8 IMPLANT
SET CYSTO W/LG BORE CLAMP LF (SET/KITS/TRAYS/PACK) ×4 IMPLANT
SET IRRIG TUBING LAPAROSCOPIC (IRRIGATION / IRRIGATOR) ×8 IMPLANT
SET TRI-LUMEN FLTR TB AIRSEAL (TUBING) ×4 IMPLANT
SHEARS HARMONIC ACE PLUS 36CM (ENDOMECHANICALS) ×4 IMPLANT
SUT VIC AB 0 CT1 27 (SUTURE) ×2
SUT VIC AB 0 CT1 27XBRD ANBCTR (SUTURE) ×2 IMPLANT
SUT VICRYL 0 UR6 27IN ABS (SUTURE) ×4 IMPLANT
SUT VICRYL 4-0 PS2 18IN ABS (SUTURE) ×4 IMPLANT
SUT VLOC 180 0 9IN  GS21 (SUTURE)
SUT VLOC 180 0 9IN GS21 (SUTURE) IMPLANT
SYR 50ML LL SCALE MARK (SYRINGE) ×8 IMPLANT
SYSTEM CONTND EXTRCTN KII BLLN (MISCELLANEOUS) ×2 IMPLANT
TOWEL OR 17X24 6PK STRL BLUE (TOWEL DISPOSABLE) ×8 IMPLANT
TRAY FOLEY CATH SILVER 14FR (SET/KITS/TRAYS/PACK) ×4 IMPLANT
TRENDGUARD 600 HYBRID PROC PK (MISCELLANEOUS) ×4
TROCAR ADV FIXATION 5X100MM (TROCAR) ×4 IMPLANT
TROCAR PORT AIRSEAL 5X120 (TROCAR) ×4 IMPLANT
TROCAR XCEL NON BLADE 8MM B8LT (ENDOMECHANICALS) ×4 IMPLANT
TROCAR XCEL NON-BLD 5MMX100MML (ENDOMECHANICALS) ×4 IMPLANT
TUBING NON-CON 1/4 X 20 CONN (TUBING) ×3 IMPLANT
TUBING NON-CON 1/4 X 20' CONN (TUBING) ×1
WARMER LAPAROSCOPE (MISCELLANEOUS) ×4 IMPLANT
YANKAUER SUCT BULB TIP NO VENT (SUCTIONS) ×8 IMPLANT

## 2016-11-08 NOTE — Anesthesia Preprocedure Evaluation (Signed)
Anesthesia Evaluation  Patient identified by MRN, date of birth, ID band Patient awake    Reviewed: Allergy & Precautions, NPO status , Patient's Chart, lab work & pertinent test results  History of Anesthesia Complications (+) PONV and history of anesthetic complications  Airway Mallampati: II  TM Distance: >3 FB Neck ROM: Full    Dental  (+) Teeth Intact, Dental Advisory Given   Pulmonary neg pulmonary ROS,    Pulmonary exam normal breath sounds clear to auscultation       Cardiovascular hypertension, Pt. on medications Normal cardiovascular exam Rhythm:Regular Rate:Normal     Neuro/Psych negative neurological ROS     GI/Hepatic negative GI ROS, Neg liver ROS,   Endo/Other  Obesity   Renal/GU Renal InsufficiencyRenal disease     Musculoskeletal negative musculoskeletal ROS (+)   Abdominal   Peds  Hematology negative hematology ROS (+)   Anesthesia Other Findings Day of surgery medications reviewed with the patient.  Reproductive/Obstetrics                             Anesthesia Physical Anesthesia Plan  ASA: II  Anesthesia Plan: General   Post-op Pain Management:    Induction: Intravenous  PONV Risk Score and Plan: 4 or greater and Ondansetron, Dexamethasone, Propofol, Midazolam, Scopolamine patch - Pre-op, Promethazine and Treatment may vary due to age or medical condition  Airway Management Planned: Oral ETT  Additional Equipment:   Intra-op Plan:   Post-operative Plan: Extubation in OR  Informed Consent: I have reviewed the patients History and Physical, chart, labs and discussed the procedure including the risks, benefits and alternatives for the proposed anesthesia with the patient or authorized representative who has indicated his/her understanding and acceptance.   Dental advisory given  Plan Discussed with: CRNA  Anesthesia Plan Comments: (Risks/benefits of  general anesthesia discussed with patient including risk of damage to teeth, lips, gum, and tongue, nausea/vomiting, allergic reactions to medications, and the possibility of heart attack, stroke and death.  All patient questions answered.  Patient wishes to proceed.)        Anesthesia Quick Evaluation

## 2016-11-08 NOTE — H&P (View-Only) (Signed)
GYNECOLOGY  VISIT   HPI: 46 y.o.   Single  African American  female   [phone] with Patient's last menstrual period was 10/17/2016.   here for pre surgery consult. Patient is scheduled for TLH on 11-08-16  For a symptomatic fibroid uterus. On ultrasound her uterus measures 15.5 x 12 x 10.5 cm. Not anemic, normal TSH. Endometrial biopsy was negative.  Last pap was in 4/16, ASCUS, negative HPV.  Sexually active, no pain. Same long term partner.   She works as a Chartered certified accountant.   GYNECOLOGIC HISTORY: Patient's last menstrual period was 10/17/2016. Contraception: tubal ligation  Menopausal hormone therapy: none        OB History    Gravida Para Term Preterm AB Living   4 2 2   2 2    SAB TAB Ectopic Multiple Live Births   1       2         Patient Active Problem List   Diagnosis Date Noted  . Severe obesity (BMI >= 40) ([place]) 07/28/2014  . Dizziness and giddiness 03/18/2013  . Essential hypertension, benign 03/18/2013  . Breast pain, left 03/18/2013    Past Medical History:  Diagnosis Date  . Allergy   . Hypertension     Past Surgical History:  Procedure Laterality Date  . TUBAL LIGATION      Current Outpatient Prescriptions  Medication Sig Dispense Refill  . levocetirizine (XYZAL) 5 MG tablet Take 1 tablet (5 mg total) by mouth every evening. 30 tablet 5  . naproxen sodium (ANAPROX DS) 550 MG tablet Take 1 tablet (550 mg total) by mouth 2 (two) times daily with a meal. 30 tablet 2  . triamterene-hydrochlorothiazide (MAXZIDE-25) 37.5-25 MG tablet Take 1 tablet by mouth daily. 30 tablet 5  . Multiple Vitamin (MULTIVITAMIN WITH MINERALS) TABS tablet Take 1 tablet by mouth daily.      No current facility-administered medications for this visit.      ALLERGIES: Patient has no known allergies.  Family History  Problem Relation Age of Onset  . Diabetes Mother   . Hypertension Mother   . Cancer Mother        liver   . Hyperlipidemia Mother   . Stroke Mother   . Diabetes  Maternal Grandmother   . Hypertension Maternal Grandmother   . Hyperlipidemia Maternal Grandmother   . Heart disease Maternal Grandmother   . Hypertension Sister   . Cancer Maternal Grandfather        prostate  . Diabetes Maternal Grandfather   . Hyperlipidemia Maternal Grandfather   . Hypertension Maternal Grandfather   . Colon cancer Maternal Grandfather   . Diabetes Sister   . Diabetes Maternal Uncle     Social History   Social History  . Marital status: Single    Spouse name: N/A  . Number of children: N/A  . Years of education: N/A   Occupational History  . Not on file.   Social History Main Topics  . Smoking status: Never Smoker  . Smokeless tobacco: Never Used  . Alcohol use 1.8 oz/week    3 Standard drinks or equivalent per week  . Drug use: Yes    Types: Marijuana     Comment: occ  . Sexual activity: Yes    Partners: Male    Birth control/ protection: Surgical   Other Topics Concern  . Not on file   Social History Narrative   Marital status: single; not dating.  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      Tobacco: none      Alcohol:  Socially; rarely.      Drugs: none; marijuana in the past.      Exercise: none      History of incarceration for six years (guilt by association; friend robbed bank).      Sexual activity:  Males; last STD Gonorrhea at age 49.  Syphilis in twenties.     Review of Systems  Constitutional: Negative.   HENT: Negative.   Eyes: Negative.   Respiratory: Negative.   Cardiovascular: Negative.   Gastrointestinal: Negative.   Genitourinary:       Heavy menstrual bleeding   Musculoskeletal: Negative.   Skin: Negative.   Neurological: Negative.   Endo/Heme/Allergies: Negative.   Psychiatric/Behavioral: Negative.     PHYSICAL EXAMINATION:    BP 122/70 (BP Location: Right Arm, Patient Position: Sitting, Cuff Size: Normal)    Pulse 80   Resp 16   Wt 245 lb (111.1 kg)   LMP 10/17/2016   BMI 37.81 kg/m     General appearance: alert, cooperative and appears stated age Neck: no adenopathy, supple, symmetrical, trachea midline and thyroid normal to inspection and palpation Heart: regular rate and rhythm Lungs: CTAB Abdomen: soft, non-tender; bowel sounds normal; no masses,  no organomegaly. She has an umbilical hernia.  Extremities: normal, atraumatic, no cyanosis Skin: normal color, texture and turgor, no rashes or lesions Lymph: normal cervical supraclavicular and inguinal nodes Neurologic: grossly normal   Pelvic: External genitalia:  no lesions              Urethra:  normal appearing urethra with no masses, tenderness or lesions              Bartholins and Skenes: normal                 Vagina: normal appearing vagina with normal color and discharge, no lesions              Cervix: no lesions              Bimanual Exam:  Uterus:  16 week sized, anteverted, mobile, tender uterus              Adnexa: no mass, fullness, tenderness                Chaperone was present for exam.  ASSESSMENT Symptomatic fibroid uterus, negative biopsy, not anemic Umbilical hernia, will be repaired at another time.  PLAN Will send pap with hpv Discussed total laparoscopic hysterectomy, bilateral salpingectomy and cystoscopy. Reviewed the risks of the procedure, including infection, bleeding, damage to bowel/badder/vessels/ureters.  Discussed the possible need for laparotomy. Discussed post operative recovery and risk of cuff dehiscence. All of her questions were answered      An After Visit Summary was printed and given to the patient.  CC: Cammie Sickle, FNP

## 2016-11-08 NOTE — Interval H&P Note (Signed)
History and Physical Interval Note:  11/08/2016 7:12 AM  Abigail Beasley  has presented today for surgery, with the diagnosis of fibroids  The various methods of treatment have been discussed with the patient and family. After consideration of risks, benefits and other options for treatment, the patient has consented to  Procedure(s): HYSTERECTOMY TOTAL LAPAROSCOPIC WITH SALPINGECTOMY (Bilateral) CYSTOSCOPY (N/A) as a surgical intervention .  The patient's history has been reviewed, patient examined, no change in status, stable for surgery.  I have reviewed the patient's chart and labs.  Questions were answered to the patient's satisfaction.    F/U CMP from yesterday, still with mildly elevated creatinine. Glucose elevated, no h/o diabetes, she just ate prior to blood work.    Salvadore Dom

## 2016-11-08 NOTE — Anesthesia Postprocedure Evaluation (Signed)
Anesthesia Post Note  Patient: Myna Bright  Procedure(s) Performed: Procedure(s) (LRB): HYSTERECTOMY TOTAL LAPAROSCOPIC WITH BILATERAL SALPINGECTOMY (Bilateral) CYSTOSCOPY (N/A)     Patient location during evaluation: PACU Anesthesia Type: General Level of consciousness: awake and alert Pain management: pain level controlled Vital Signs Assessment: post-procedure vital signs reviewed and stable Respiratory status: spontaneous breathing, nonlabored ventilation and respiratory function stable Cardiovascular status: blood pressure returned to baseline and stable Postop Assessment: no signs of nausea or vomiting Anesthetic complications: no    Last Vitals:  Vitals:   11/08/16 1345 11/08/16 1400  BP: (!) 120/58 110/74  Pulse: 79 77  Resp: 16 13  Temp:  36.6 C    Last Pain:  Vitals:   11/08/16 1300  TempSrc:   PainSc: Asleep   Pain Goal: Patients Stated Pain Goal: 4 (11/08/16 0608)               Lynda Rainwater

## 2016-11-08 NOTE — Transfer of Care (Signed)
Immediate Anesthesia Transfer of Care Note  Patient: Abigail Beasley  Procedure(s) Performed: Procedure(s): HYSTERECTOMY TOTAL LAPAROSCOPIC WITH BILATERAL SALPINGECTOMY (Bilateral) CYSTOSCOPY (N/A)  Patient Location: PACU  Anesthesia Type:General  Level of Consciousness: awake, alert  and oriented  Airway & Oxygen Therapy: Patient Spontanous Breathing and Patient connected to nasal cannula oxygen  Post-op Assessment: Report given to RN and Post -op Vital signs reviewed and stable HR 84, RR 14, BP 146/84, SaO2 97%  Post vital signs: Reviewed and stable  Last Vitals:  Vitals:   11/08/16 0608  BP: (!) 146/98  Pulse: 76  Resp: 18  Temp: 36.9 C    Last Pain:  Vitals:   11/08/16 0608  TempSrc: Oral  PainSc: 4       Patients Stated Pain Goal: 4 (58/48/35 0757)  Complications: No apparent anesthesia complications

## 2016-11-08 NOTE — Op Note (Signed)
Preoperative Diagnosis: Symptomatic fibroid uterus  Postoperative Diagnosis: Same  Procedure:  Total Laparoscopic Hysterectomy with bilateral salpingectomies and cystoscopy  Surgeon: Dr Sumner Boast  Assistant: Dr Edwinna Areola  Anesthesia: General  EBL: 200 cc  Fluids: 2,000 cc LR  Urine output: 616 cc  Complications: none  Indications for surgery: The patient is a 46 year old female, who presented with menorrhagia, dysmenorrhea. Work up included an ultrasound that revealed a 15.5 x 12 x 10.5 cm uterus, a normal CBC and TSH and a benign endometrial biopsy. Options for treatment were discussed. The patient desired definitive surgery. The patient was noted to have an umbilical hernia at her exam, a surgery consult was obtained. It was recommended by the General Surgeon that she have a hernia repair at a different time than her hysterectomy secondary to the use of mesh.  The patient is aware of the risks and complications involved with the surgery and consent was obtained prior to the procedure.  Findings: 16 week sized mobile uterus. Normal adnexa bilaterally with evidence of prior tubal ligation. Normal liver edge. The uterus was over 840 grams  Procedure: The patient was taken to the operating room with an IV in placed, preoperative antibiotics and lovenox had been administered. She was placed in the dorsal lithotomy position. General anesthesia was administered. She was prepped and draped in the usual sterile fashion for an abdominal, vaginal surgery. A rumi uterine manipulator was placed, using a # 3 cup and a 12 cm extender. A foley catheter was placed. An OG tube was placed to empty the stomach.    A left upper quadrant entry was used to gain access to the abdominal cavity. An area 2-3 cm below the rib cage in the midclavicular line was injected with local and incised with a #11 blade. A #5 opti-view trocar was placed in the abdominal cavity under direct visualization with the  laparoscope. The abdominal cavity was insufflated with CO2. After adequate pneumo-insufflation the patient was placed in trendelenburg and the abdominal pelvic cavity was inspected. 3 more trocars were placed: 1 in each lateral mid abdomen and one in the umbilicus (through her hernia). These areas were injected with 0.25% marcaine, incised with a #11 blade and all trocars were inserted with direct visualization with the laparoscope. A # 5 airseal trocar was placed in the LLQ, a 5 mm trocar in the RLQ and a #8 trocar in the midline. The abdominal pelvic cavity was again inspected. The ureters were identified bilaterally and noted to be away from the planned surgical incisions.  A mixture of 30 cc of Robivacaine and 30 cc of NS was place in the pelvic cavity.   The left tube was elevated from the pelvic sidewall, and separated from the ovary with the ligasure device. The mesosalpinx was cauterized and cut with the ligasure device. The tube was separated from the uterus using the ligasure device and removed through the midline trocar. The uteroovarian ligament was cauterized and cut with the ligasure device. The left round ligament was cauterized and cut with the ligasure device and the anterior and posterior leafs of the broad ligament were taken down with the ligasure device. The harmonic scalpel was then used to take down the bladder flap and skeltonize the vessels. The left uterine vessels were then clamped, cauterized and ligated with the ligasure device. Hemostasis was excellent. The same procedure was repeated on the right.   Using the rumi manipulator the uterus was pushed up in the pelvic cavity  and the harmonic scalpel was used to separate the cervix from the vagina using the harmonic energy.   The rumi device was then removed and a large alexis bag was placed hrough the vagina and into the uterine cavity. The uterus was placed in the bag and the ends of the bag were brought out through the vagina. The  uterus was very bulky and did not come into the vagina. Over the next 2.5 hours the uterus was carefully morcellated while contained in the alexis bag and removed from the vagina in pieces. The bag remained intact.    An occluder was placed in the vagina to maintain pneumoperitoneum. The vaginal cuff was then closed with a 0 V-lock suture. Hemostasis was excellent. The abdominal pelvic cavity was irrigated and suctioned dry. Pressure was released and hemostasis remained excellent. Arista was placed in the pelvis.   The abdominal cavity was desufflated and the trocars were removed. The skin was closed with subcuticular stiches of 4-0 vicryl and dermabond was placed over the incisions.  The foley catheter was removed and cystoscopy was performed using a 70 degree scope. Both ureters expelled urine, no bladder abnormalities were noted. The bladder was allowed to drain and the cystoscope was removed. The vagina was inspected, there were no vaginal lacerations, the vaginal cuff was hemostatic.   The patient's abdomen and perineum were cleansed and she was taken out of the dorsal lithotomy position. Upon awakening she was extubated and taken to the recovery room in stable condition. The sponge and instrument counts were correct.   This case took over 4.5 hours to complete. More than 2 x the time for a typical hysterectomy.   CC: Cammie Sickle, FNP

## 2016-11-08 NOTE — Progress Notes (Signed)
Day of Surgery Procedure(s) (LRB): HYSTERECTOMY TOTAL LAPAROSCOPIC WITH BILATERAL SALPINGECTOMY (Bilateral) CYSTOSCOPY (N/A)  Subjective: Patient c/o moderate pain, she vomited x 2 earlier, feeling better now. She has ambulated and voided. She is starting to feel slightly hungry.   Objective: I have reviewed patient's vital signs and intake and output.  Today's Vitals   11/08/16 1650 11/08/16 1700 11/08/16 1720 11/08/16 1835  BP:      Pulse:      Resp:      Temp:      TempSrc:      SpO2:      Weight:  251 lb 2 oz (113.9 kg)    Height:  5\' 7"  (1.702 m)    PainSc: 10-Worst pain ever  6  6    I/O last 3 completed shifts: In: 2953.8 [P.O.:200; I.V.:2753.8] Out: 875 [Urine:575; Emesis/NG output:100; Blood:200] No intake/output data recorded.     General: alert and cooperative Resp: clear to auscultation bilaterally Cardio: regular rate and rhythm GI: soft, appropriately tender, mildly distended, +BS. Incisions are clean, dry and intact without erythema.  Extremities: extremities normal, atraumatic, no cyanosis or edema  Assessment: s/p Procedure(s): HYSTERECTOMY TOTAL LAPAROSCOPIC WITH BILATERAL SALPINGECTOMY (Bilateral) CYSTOSCOPY (N/A): her pain is partially controlled, she is ambulating, just starting to eat, voiding  Plan: Advance diet Encourage ambulation  LOS: 0 days    Salvadore Dom 11/08/2016, 7:01 PM

## 2016-11-08 NOTE — Anesthesia Procedure Notes (Signed)
Procedure Name: Intubation Date/Time: 11/08/2016 7:31 AM Performed by: Elenore Paddy Pre-anesthesia Checklist: Patient identified, Emergency Drugs available, Suction available, Patient being monitored and Timeout performed Patient Re-evaluated:Patient Re-evaluated prior to inductionOxygen Delivery Method: Circle system utilized Preoxygenation: Pre-oxygenation with 100% oxygen Intubation Type: IV induction Ventilation: Mask ventilation without difficulty Laryngoscope Size: Mac and 3 Grade View: Grade III Tube type: Oral Number of attempts: 1 Airway Equipment and Method: Stylet (Positioned on blanket rolls for optimal sniffing position.) Placement Confirmation: ETT inserted through vocal cords under direct vision,  positive ETCO2 and breath sounds checked- equal and bilateral Secured at: 22 cm Tube secured with: Tape Dental Injury: Teeth and Oropharynx as per pre-operative assessment

## 2016-11-09 ENCOUNTER — Encounter (HOSPITAL_COMMUNITY): Payer: Self-pay | Admitting: Obstetrics and Gynecology

## 2016-11-09 DIAGNOSIS — D251 Intramural leiomyoma of uterus: Secondary | ICD-10-CM | POA: Diagnosis not present

## 2016-11-09 LAB — COMPREHENSIVE METABOLIC PANEL
ALT: 13 U/L — ABNORMAL LOW (ref 14–54)
AST: 16 U/L (ref 15–41)
Albumin: 2.9 g/dL — ABNORMAL LOW (ref 3.5–5.0)
Alkaline Phosphatase: 46 U/L (ref 38–126)
Anion gap: 7 (ref 5–15)
BUN: 18 mg/dL (ref 6–20)
CO2: 24 mmol/L (ref 22–32)
Calcium: 8.2 mg/dL — ABNORMAL LOW (ref 8.9–10.3)
Chloride: 106 mmol/L (ref 101–111)
Creatinine, Ser: 1.21 mg/dL — ABNORMAL HIGH (ref 0.44–1.00)
GFR calc Af Amer: >60 mL/min (ref >60)
GFR calc non Af Amer: 52 mL/min — ABNORMAL LOW (ref >60)
Glucose, Bld: 108 mg/dL — ABNORMAL HIGH (ref 65–99)
Potassium: 3.9 mmol/L (ref 3.5–5.1)
Sodium: 137 mmol/L (ref 135–145)
Total Bilirubin: 0.6 mg/dL (ref 0.3–1.2)
Total Protein: 6.1 g/dL — ABNORMAL LOW (ref 6.5–8.1)

## 2016-11-09 LAB — CBC
HCT: 34.5 % — ABNORMAL LOW (ref 36.0–46.0)
Hemoglobin: 11.3 g/dL — ABNORMAL LOW (ref 12.0–15.0)
MCH: 29.2 pg (ref 26.0–34.0)
MCHC: 32.8 g/dL (ref 30.0–36.0)
MCV: 89.1 fL (ref 78.0–100.0)
PLATELETS: 277 10*3/uL (ref 150–400)
RBC: 3.87 MIL/uL (ref 3.87–5.11)
RDW: 14.1 % (ref 11.5–15.5)
WBC: 10.4 10*3/uL (ref 4.0–10.5)

## 2016-11-09 MED ORDER — HYDROMORPHONE HCL 2 MG PO TABS: ORAL_TABLET | ORAL | 0 refills | Status: DC

## 2016-11-09 MED ORDER — HYDROMORPHONE HCL 2 MG PO TABS
2.0000 mg | ORAL_TABLET | ORAL | Status: DC | PRN
  Administered 2016-11-09: 4 mg via ORAL
  Filled 2016-11-09 (×2): qty 1

## 2016-11-09 MED ORDER — DOCUSATE SODIUM 100 MG PO CAPS: 100.0000 mg | ORAL_CAPSULE | Freq: Two times a day (BID) | ORAL | 0 refills | Status: DC

## 2016-11-09 MED ORDER — IBUPROFEN 600 MG PO TABS: 600.0000 mg | ORAL_TABLET | Freq: Three times a day (TID) | ORAL | 0 refills | Status: DC | PRN

## 2016-11-09 NOTE — Progress Notes (Signed)
1 Day Post-Op Procedure(s) (LRB): HYSTERECTOMY TOTAL LAPAROSCOPIC WITH BILATERAL SALPINGECTOMY (Bilateral) CYSTOSCOPY (N/A)  Subjective: Patient reports that her pain isn't controlled with the tramadol, helped with dilaudid. Ambulating, voiding, tolerating po.     Objective: I have reviewed patient's vital signs, intake and output and labs.  General: alert, cooperative and no distress Resp: clear to auscultation bilaterally Cardio: regular rate and rhythm, S1, S2 normal, no murmur, click, rub or gallop GI: soft, appropriately tender, +BS, mildly distended. Incisions are clean dry and intact without erythema. Extremities: extremities normal, atraumatic, no cyanosis or edema  Today's Vitals   11/09/16 0008 11/09/16 0132 11/09/16 0330 11/09/16 0650  BP:   123/60   Pulse:   79   Resp:   16   Temp:   99.7 F (37.6 C)   TempSrc:   Oral   SpO2:      Weight:      Height:      PainSc: 8  4   4      Intake/Output Summary (Last 24 hours) at 11/09/16 0810 Last data filed at 11/08/16 1800  Gross per 24 hour  Intake          1953.75 ml  Output              875 ml  Net          1078.75 ml  Voids from today not recoreded  Lab Results  Component Value Date   WBC 10.4 11/09/2016   HGB 11.3 (L) 11/09/2016   HCT 34.5 (L) 11/09/2016   MCV 89.1 11/09/2016   PLT 277 11/09/2016    Lab Results  Component Value Date   CREATININE 1.21 (H) 11/09/2016      Assessment: s/p Procedure(s): HYSTERECTOMY TOTAL LAPAROSCOPIC WITH BILATERAL SALPINGECTOMY (Bilateral) CYSTOSCOPY (N/A): stable, progressing well and tolerating diet  Plan: Discharge home  The patient's creatinine is stable at 1.2, GFR is down slightly this morning. She states she voided a large amount this morning. Will d/c home. She has been instructed to take tylenol around the clock, dilaudid as needed, and only to use ibuprofen sparingly for break through pain  LOS: 0 days    Salvadore Dom 11/09/2016, 7:56  AM

## 2016-11-09 NOTE — Progress Notes (Signed)
Pt discharged to home with friend.  Condition stable.  Pt to car via wheelchair with Astrid Divine, NT.  No equipment for home ordered at discharge.

## 2016-11-15 ENCOUNTER — Ambulatory Visit (INDEPENDENT_AMBULATORY_CARE_PROVIDER_SITE_OTHER): Payer: BLUE CROSS/BLUE SHIELD | Admitting: Obstetrics and Gynecology

## 2016-11-15 ENCOUNTER — Encounter: Payer: Self-pay | Admitting: Obstetrics and Gynecology

## 2016-11-15 VITALS — BP 112/64 | HR 72 | Temp 99.3°F | Resp 16 | Ht 67.25 in | Wt 250.0 lb

## 2016-11-15 DIAGNOSIS — R7989 Other specified abnormal findings of blood chemistry: Secondary | ICD-10-CM

## 2016-11-15 DIAGNOSIS — G8918 Other acute postprocedural pain: Secondary | ICD-10-CM

## 2016-11-15 DIAGNOSIS — Z90710 Acquired absence of both cervix and uterus: Secondary | ICD-10-CM

## 2016-11-15 DIAGNOSIS — K5900 Constipation, unspecified: Secondary | ICD-10-CM

## 2016-11-15 NOTE — Patient Instructions (Signed)
Senna 1-2 tablets, 1-2 x a day.

## 2016-11-15 NOTE — Progress Notes (Signed)
. GYNECOLOGY  VISIT   HPI: 46 y.o.   Single  African American  female   [phone] with Patient's last menstrual period was 10/17/2016 (exact date).   here for 1 week post op. She c/o BLQ abdominal pain, constant since surgery. About a 5/10 in severity. It has improved since surgery, down from an 8/10 when she went home from the hospital. Only one small BM since surgery, normally has a BM 1-2 x a day. Sleeping okay. No fevers.  Voiding fine. No bleeding. She is taking 1, 600 mg tablets of ibuprofen, 2 dilaudid 2 x a day, tylenol 2 x a day. She still has about 8-10 dilaudid left (out of 30).    GYNECOLOGIC HISTORY: Patient's last menstrual period was 10/17/2016 (exact date). Contraception: hysterectomy Menopausal hormone therapy: none        OB History    Gravida Para Term Preterm AB Living   4 2 2   2 2    SAB TAB Ectopic Multiple Live Births   1       2         Patient Active Problem List   Diagnosis Date Noted  . Status post laparoscopic hysterectomy 11/08/2016  . Severe obesity (BMI >= 40) ([place]) 07/28/2014  . Dizziness and giddiness 03/18/2013  . Essential hypertension, benign 03/18/2013  . Breast pain, left 03/18/2013    Past Medical History:  Diagnosis Date  . Allergy   . Hypertension   . PONV (postoperative nausea and vomiting)     Past Surgical History:  Procedure Laterality Date  . CYSTOSCOPY N/A 11/08/2016   Procedure: CYSTOSCOPY;  Surgeon: Salvadore Dom, MD;  Location: [facility];  Service: Gynecology;  Laterality: N/A;  . DILATION AND EVACUATION     miscarriage  . TOTAL LAPAROSCOPIC HYSTERECTOMY WITH SALPINGECTOMY Bilateral 11/08/2016   Procedure: HYSTERECTOMY TOTAL LAPAROSCOPIC WITH BILATERAL SALPINGECTOMY;  Surgeon: Salvadore Dom, MD;  Location: [facility];  Service: Gynecology;  Laterality: Bilateral;  . TUBAL LIGATION      Current Outpatient Prescriptions  Medication Sig Dispense Refill  . HYDROmorphone (DILAUDID) 2 MG tablet 1-2 tablets po q 4-6  hours prn pain 30 tablet 0  . ibuprofen (ADVIL,MOTRIN) 600 MG tablet Take 1 tablet (600 mg total) by mouth every 8 (eight) hours as needed. 30 tablet 0  . triamterene-hydrochlorothiazide (MAXZIDE-25) 37.5-25 MG tablet Take 1 tablet by mouth daily. (Patient taking differently: Take 1 tablet by mouth daily at 6 (six) AM. ) 30 tablet 5  . Multiple Vitamin (MULTIVITAMIN WITH MINERALS) TABS tablet Take 1 tablet by mouth 4 (four) times a week.      No current facility-administered medications for this visit.      ALLERGIES: Patient has no known allergies.  Family History  Problem Relation Age of Onset  . Diabetes Mother   . Hypertension Mother   . Cancer Mother        liver   . Hyperlipidemia Mother   . Stroke Mother   . Diabetes Maternal Grandmother   . Hypertension Maternal Grandmother   . Hyperlipidemia Maternal Grandmother   . Heart disease Maternal Grandmother   . Hypertension Sister   . Cancer Maternal Grandfather        prostate  . Diabetes Maternal Grandfather   . Hyperlipidemia Maternal Grandfather   . Hypertension Maternal Grandfather   . Colon cancer Maternal Grandfather   . Diabetes Sister   . Diabetes Maternal Uncle     Social History   Social History  .  Marital status: Single    Spouse name: N/A  . Number of children: N/A  . Years of education: N/A   Occupational History  . Not on file.   Social History Main Topics  . Smoking status: Never Smoker  . Smokeless tobacco: Never Used  . Alcohol use 1.8 oz/week    3 Standard drinks or equivalent per week  . Drug use: Yes    Types: Marijuana     Comment: occ  . Sexual activity: Not Currently    Partners: Male    Birth control/ protection: Surgical   Other Topics Concern  . Not on file   Social History Narrative   Marital status: single; not dating.      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      Tobacco: none       Alcohol:  Socially; rarely.      Drugs: none; marijuana in the past.      Exercise: none      History of incarceration for six years (guilt by association; friend robbed bank).      Sexual activity:  Males; last STD Gonorrhea at age 66.  Syphilis in twenties.     Review of Systems  Constitutional: Negative.   HENT: Negative.   Eyes: Negative.   Respiratory: Negative.   Cardiovascular: Negative.   Gastrointestinal: Positive for constipation.  Genitourinary:       Lower pelvic burning  Musculoskeletal: Negative.   Skin: Negative.   Neurological: Negative.   Endo/Heme/Allergies: Negative.   Psychiatric/Behavioral: Negative.     PHYSICAL EXAMINATION:    BP 112/64   Pulse 72   Temp 99.3 F (37.4 C) (Oral)   Resp 16   Ht 5' 7.25" (1.708 m)   Wt 250 lb (113.4 kg)   LMP 10/17/2016 (Exact Date)   BMI 38.86 kg/m     General appearance: alert, cooperative and appears stated age Abdomen: soft, slightly tender BLQ, no rebound, no guarding, no masses. Mildly distended. Incisions are healing well.   Pelvic: External genitalia:  no lesions              Bimanual exam: no bladder tenderness, mildly tender bilateral adnexal regions              Minimal stool palpated in the rectum (on vaginal exam)  Chaperone was present for exam.  ASSESSMENT 1 week post op s/p TLH/BS, having steady lower abdominal pain, improved since last week, still bothersome Constipation, discussed that the dilaudid can cause constipation Given her elevated creatinine, she has been told to only take the ibuprofen for breakthrough pain    PLAN CBC, renal panel Try senna for constipation Tylenol for pain, dilaudid if needed and ibuprofen if still hurting She will call if she needs more dilaudid, I hope that when she has a BM her pain will improve.    An After Visit Summary was printed and given to the patient.

## 2016-11-16 LAB — RENAL FUNCTION PANEL
Albumin: 3.8 g/dL (ref 3.5–5.5)
BUN/Creatinine Ratio: 9 (ref 9–23)
BUN: 12 mg/dL (ref 6–24)
CHLORIDE: 104 mmol/L (ref 96–106)
CO2: 22 mmol/L (ref 20–29)
CREATININE: 1.32 mg/dL — AB (ref 0.57–1.00)
Calcium: 9.4 mg/dL (ref 8.7–10.2)
GFR, EST AFRICAN AMERICAN: 55 mL/min/{1.73_m2} — AB (ref >59)
GFR, EST NON AFRICAN AMERICAN: 48 mL/min/{1.73_m2} — AB (ref >59)
Glucose: 83 mg/dL (ref 65–99)
Phosphorus: 2.9 mg/dL (ref 2.5–4.5)
Potassium: 5 mmol/L (ref 3.5–5.2)
Sodium: 142 mmol/L (ref 134–144)

## 2016-11-16 LAB — CBC WITH DIFFERENTIAL/PLATELET
BASOS: 1 %
Basophils Absolute: 0 10*3/uL (ref 0.0–0.2)
EOS (ABSOLUTE): 0.3 10*3/uL (ref 0.0–0.4)
EOS: 5 %
HEMATOCRIT: 36.6 % (ref 34.0–46.6)
Hemoglobin: 11.9 g/dL (ref 11.1–15.9)
Immature Grans (Abs): 0 10*3/uL (ref 0.0–0.1)
Immature Granulocytes: 0 %
LYMPHS ABS: 1.2 10*3/uL (ref 0.7–3.1)
Lymphs: 18 %
MCH: 28.3 pg (ref 26.6–33.0)
MCHC: 32.5 g/dL (ref 31.5–35.7)
MCV: 87 fL (ref 79–97)
MONOCYTES: 10 %
MONOS ABS: 0.7 10*3/uL (ref 0.1–0.9)
NEUTROS ABS: 4.4 10*3/uL (ref 1.4–7.0)
Neutrophils: 66 %
Platelets: 391 10*3/uL — ABNORMAL HIGH (ref 150–379)
RBC: 4.2 x10E6/uL (ref 3.77–5.28)
RDW: 14.3 % (ref 12.3–15.4)
WBC: 6.6 10*3/uL (ref 3.4–10.8)

## 2016-12-05 ENCOUNTER — Encounter: Payer: Self-pay | Admitting: Obstetrics and Gynecology

## 2016-12-05 ENCOUNTER — Ambulatory Visit (INDEPENDENT_AMBULATORY_CARE_PROVIDER_SITE_OTHER): Payer: BLUE CROSS/BLUE SHIELD | Admitting: Obstetrics and Gynecology

## 2016-12-05 ENCOUNTER — Telehealth: Payer: Self-pay | Admitting: Obstetrics and Gynecology

## 2016-12-05 VITALS — BP 120/78 | HR 72 | Resp 20 | Wt 246.0 lb

## 2016-12-05 DIAGNOSIS — R102 Pelvic and perineal pain: Secondary | ICD-10-CM

## 2016-12-05 DIAGNOSIS — K5900 Constipation, unspecified: Secondary | ICD-10-CM

## 2016-12-05 DIAGNOSIS — N920 Excessive and frequent menstruation with regular cycle: Secondary | ICD-10-CM

## 2016-12-05 DIAGNOSIS — N939 Abnormal uterine and vaginal bleeding, unspecified: Secondary | ICD-10-CM

## 2016-12-05 DIAGNOSIS — R109 Unspecified abdominal pain: Secondary | ICD-10-CM

## 2016-12-05 DIAGNOSIS — Z90710 Acquired absence of both cervix and uterus: Secondary | ICD-10-CM

## 2016-12-05 NOTE — Progress Notes (Signed)
GYNECOLOGY  VISIT   HPI: 46 y.o.   Single  African American  female   [phone] with Patient's last menstrual period was 10/17/2016 (exact date).   here c/o vaginal bleeding and pelvic pain. Pain is  26 days S/P TLH This morning she had some blood when she wiped after voiding.  Up until today she has been tired, but other wise feeling okay. The bleeding has been light, only using mini-pads. She is having constant pelvic pain up to a 4/10, an achy, burning feeling. Feels similar to post op pain. She hasn't had sex.  She had some nausea 2 days ago. Not now. No fevers, no vomiting. Still constipated. Last BM was on Wednesday. She took senna 2 x the week before last, no BM. Normally has a BM every 1-2 days. No urinary c/o.   GYNECOLOGIC HISTORY: Patient's last menstrual period was 10/17/2016 (exact date). Contraception:hysterectomy  Menopausal hormone therapy: none         OB History    Gravida Para Term Preterm AB Living   4 2 2   2 2    SAB TAB Ectopic Multiple Live Births   1       2         Patient Active Problem List   Diagnosis Date Noted  . Status post laparoscopic hysterectomy 11/08/2016  . Severe obesity (BMI >= 40) ([place]) 07/28/2014  . Dizziness and giddiness 03/18/2013  . Essential hypertension, benign 03/18/2013  . Breast pain, left 03/18/2013    Past Medical History:  Diagnosis Date  . Allergy   . Hypertension   . PONV (postoperative nausea and vomiting)     Past Surgical History:  Procedure Laterality Date  . ABDOMINAL HYSTERECTOMY    . CYSTOSCOPY N/A 11/08/2016   Procedure: CYSTOSCOPY;  Surgeon: Salvadore Dom, MD;  Location: [facility];  Service: Gynecology;  Laterality: N/A;  . DILATION AND EVACUATION     miscarriage  . TOTAL LAPAROSCOPIC HYSTERECTOMY WITH SALPINGECTOMY Bilateral 11/08/2016   Procedure: HYSTERECTOMY TOTAL LAPAROSCOPIC WITH BILATERAL SALPINGECTOMY;  Surgeon: Salvadore Dom, MD;  Location: [facility];  Service: Gynecology;  Laterality:  Bilateral;  . TUBAL LIGATION      Current Outpatient Prescriptions  Medication Sig Dispense Refill  . ibuprofen (ADVIL,MOTRIN) 600 MG tablet Take 1 tablet (600 mg total) by mouth every 8 (eight) hours as needed. 30 tablet 0  . Multiple Vitamin (MULTIVITAMIN WITH MINERALS) TABS tablet Take 1 tablet by mouth 4 (four) times a week.     . triamterene-hydrochlorothiazide (MAXZIDE-25) 37.5-25 MG tablet Take 1 tablet by mouth daily. (Patient taking differently: Take 1 tablet by mouth daily at 6 (six) AM. ) 30 tablet 5   No current facility-administered medications for this visit.      ALLERGIES: Patient has no known allergies.  Family History  Problem Relation Age of Onset  . Diabetes Mother   . Hypertension Mother   . Cancer Mother        liver   . Hyperlipidemia Mother   . Stroke Mother   . Diabetes Maternal Grandmother   . Hypertension Maternal Grandmother   . Hyperlipidemia Maternal Grandmother   . Heart disease Maternal Grandmother   . Hypertension Sister   . Cancer Maternal Grandfather        prostate  . Diabetes Maternal Grandfather   . Hyperlipidemia Maternal Grandfather   . Hypertension Maternal Grandfather   . Colon cancer Maternal Grandfather   . Diabetes Sister   . Diabetes Maternal  Uncle     Social History   Social History  . Marital status: Single    Spouse name: N/A  . Number of children: N/A  . Years of education: N/A   Occupational History  . Not on file.   Social History Main Topics  . Smoking status: Never Smoker  . Smokeless tobacco: Never Used  . Alcohol use 1.8 oz/week    3 Standard drinks or equivalent per week  . Drug use: Yes    Types: Marijuana     Comment: occ  . Sexual activity: Not Currently    Partners: Male    Birth control/ protection: Surgical   Other Topics Concern  . Not on file   Social History Narrative   Marital status: single; not dating.      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      Tobacco: none      Alcohol:  Socially; rarely.      Drugs: none; marijuana in the past.      Exercise: none      History of incarceration for six years (guilt by association; friend robbed bank).      Sexual activity:  Males; last STD Gonorrhea at age 44.  Syphilis in twenties.     Review of Systems  Constitutional: Negative.   HENT: Negative.   Eyes: Negative.   Respiratory: Negative.   Cardiovascular: Negative.   Gastrointestinal: Negative.   Genitourinary:       Vaginal bleeding  Pelvic pain   Musculoskeletal: Negative.   Skin: Negative.   Neurological: Negative.   Endo/Heme/Allergies: Negative.   Psychiatric/Behavioral: Negative.     PHYSICAL EXAMINATION:    BP 120/78 (BP Location: Right Arm, Patient Position: Sitting, Cuff Size: Normal)   Pulse 72   Resp 20   Wt 246 lb (111.6 kg)   LMP 10/17/2016 (Exact Date)   BMI 38.24 kg/m     General appearance: alert, cooperative and appears stated age Abdomen: soft, mild diffuse tenderness, no rebound, no guarding, minimal distention; no masses,  no organomegaly  Pelvic: External genitalia:  no lesions              Urethra:  normal appearing urethra with no masses, tenderness or lesions              Bartholins and Skenes: normal                 Vagina: normal appearing vagina with normal color and discharge, no lesions              Cervix: absent, vaginal cuff intact, no clear source of bleeding. Small amount of blood in the vagina, minimal on her mini-pad (placed 1 hour prior). Used large graves speculum              Bimanual Exam: vaginal cuff minimally tender, no masses.  Chaperone was present for exam.  ASSESSMENT 4 weeks s/p TLH, light bleeding this morning, cuff intact, no clear granulation tissue Abdominal pain, suspect from constipation Constipation    PLAN Take it easy for the next several days Discussed trying a fleets enema Senna 2 tablets every  12 hours until she is having BM's Call with heavy bleeding, worsening pain, fever or any other concerns CBC with diff She can not return to work at this time, will reevaluate later this week  An After Visit Summary was printed and given to the patient.

## 2016-12-05 NOTE — Telephone Encounter (Signed)
Patient had surgery on 11/08/16 and is having bleeding and a lot of pain.

## 2016-12-05 NOTE — Telephone Encounter (Signed)
Spoke with patient. Patient had a TLH with bilateral salpingectomy on 11/08/2016. Reports she woke up this morning and is having bleeding like a normal menses. Is changing pad every 3 hours. Passing small clots. Reports lower pelvic pain that is 3/10 on the pain scale. Denies any heavy bleeding, fever, or chills. Patient has her 4 week follow up scheduled for tomorrow, but is very concerned. Appointment moved to today at 3:30 pm with Dr.Jertson. Patient is agreeable to date and time.  Routing to provider for final review. Patient agreeable to disposition. Will close encounter.

## 2016-12-06 ENCOUNTER — Ambulatory Visit: Payer: BLUE CROSS/BLUE SHIELD | Admitting: Obstetrics and Gynecology

## 2016-12-06 ENCOUNTER — Telehealth: Payer: Self-pay | Admitting: *Deleted

## 2016-12-06 LAB — CBC WITH DIFFERENTIAL/PLATELET
BASOS: 1 %
Basophils Absolute: 0 10*3/uL (ref 0.0–0.2)
EOS (ABSOLUTE): 0.3 10*3/uL (ref 0.0–0.4)
EOS: 4 %
HEMATOCRIT: 40 % (ref 34.0–46.6)
Hemoglobin: 13.1 g/dL (ref 11.1–15.9)
IMMATURE GRANS (ABS): 0 10*3/uL (ref 0.0–0.1)
Immature Granulocytes: 0 %
LYMPHS: 24 %
Lymphocytes Absolute: 1.5 10*3/uL (ref 0.7–3.1)
MCH: 28.3 pg (ref 26.6–33.0)
MCHC: 32.8 g/dL (ref 31.5–35.7)
MCV: 86 fL (ref 79–97)
MONOCYTES: 8 %
Monocytes Absolute: 0.5 10*3/uL (ref 0.1–0.9)
NEUTROS ABS: 4 10*3/uL (ref 1.4–7.0)
Neutrophils: 63 %
Platelets: 335 10*3/uL (ref 150–379)
RBC: 4.63 x10E6/uL (ref 3.77–5.28)
RDW: 14.7 % (ref 12.3–15.4)
WBC: 6.3 10*3/uL (ref 3.4–10.8)

## 2016-12-06 NOTE — Telephone Encounter (Signed)
-----   Message from Salvadore Dom, MD sent at 12/06/2016  9:56 AM EDT ----- Please advise the patient of normal results. Please check on how she is feeling today

## 2016-12-06 NOTE — Telephone Encounter (Signed)
Left message to call regarding lab results -eh 

## 2016-12-06 NOTE — Telephone Encounter (Signed)
Spoke with patient and gave results. Patient states that her bleeding stopped and she is feeling fine -eh

## 2016-12-08 NOTE — Progress Notes (Deleted)
GYNECOLOGY  VISIT   HPI: 46 y.o.   Single  African American  female   [phone] with Patient's last menstrual period was 10/17/2016 (exact date).   here for follow up pelvic pain s/p TLH  GYNECOLOGIC HISTORY: Patient's last menstrual period was 10/17/2016 (exact date). Contraception: hysterectomy  Menopausal hormone therapy: none        OB History    Gravida Para Term Preterm AB Living   4 2 2   2 2    SAB TAB Ectopic Multiple Live Births   1       2         Patient Active Problem List   Diagnosis Date Noted  . Status post laparoscopic hysterectomy 11/08/2016  . Severe obesity (BMI >= 40) ([place]) 07/28/2014  . Dizziness and giddiness 03/18/2013  . Essential hypertension, benign 03/18/2013  . Breast pain, left 03/18/2013    Past Medical History:  Diagnosis Date  . Allergy   . Hypertension   . PONV (postoperative nausea and vomiting)     Past Surgical History:  Procedure Laterality Date  . ABDOMINAL HYSTERECTOMY    . CYSTOSCOPY N/A 11/08/2016   Procedure: CYSTOSCOPY;  Surgeon: Salvadore Dom, MD;  Location: [facility];  Service: Gynecology;  Laterality: N/A;  . DILATION AND EVACUATION     miscarriage  . TOTAL LAPAROSCOPIC HYSTERECTOMY WITH SALPINGECTOMY Bilateral 11/08/2016   Procedure: HYSTERECTOMY TOTAL LAPAROSCOPIC WITH BILATERAL SALPINGECTOMY;  Surgeon: Salvadore Dom, MD;  Location: [facility];  Service: Gynecology;  Laterality: Bilateral;  . TUBAL LIGATION      Current Outpatient Prescriptions  Medication Sig Dispense Refill  . ibuprofen (ADVIL,MOTRIN) 600 MG tablet Take 1 tablet (600 mg total) by mouth every 8 (eight) hours as needed. 30 tablet 0  . Multiple Vitamin (MULTIVITAMIN WITH MINERALS) TABS tablet Take 1 tablet by mouth 4 (four) times a week.     . triamterene-hydrochlorothiazide (MAXZIDE-25) 37.5-25 MG tablet Take 1 tablet by mouth daily. (Patient taking differently: Take 1 tablet by mouth daily at 6 (six) AM. ) 30 tablet 5   No current  facility-administered medications for this visit.      ALLERGIES: Patient has no known allergies.  Family History  Problem Relation Age of Onset  . Diabetes Mother   . Hypertension Mother   . Cancer Mother        liver   . Hyperlipidemia Mother   . Stroke Mother   . Diabetes Maternal Grandmother   . Hypertension Maternal Grandmother   . Hyperlipidemia Maternal Grandmother   . Heart disease Maternal Grandmother   . Hypertension Sister   . Cancer Maternal Grandfather        prostate  . Diabetes Maternal Grandfather   . Hyperlipidemia Maternal Grandfather   . Hypertension Maternal Grandfather   . Colon cancer Maternal Grandfather   . Diabetes Sister   . Diabetes Maternal Uncle     Social History   Social History  . Marital status: Single    Spouse name: N/A  . Number of children: N/A  . Years of education: N/A   Occupational History  . Not on file.   Social History Main Topics  . Smoking status: Never Smoker  . Smokeless tobacco: Never Used  . Alcohol use 1.8 oz/week    3 Standard drinks or equivalent per week  . Drug use: Yes    Types: Marijuana     Comment: occ  . Sexual activity: Not Currently    Partners: Male  Birth control/ protection: Surgical   Other Topics Concern  . Not on file   Social History Narrative   Marital status: single; not dating.      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      Tobacco: none      Alcohol:  Socially; rarely.      Drugs: none; marijuana in the past.      Exercise: none      History of incarceration for six years (guilt by association; friend robbed bank).      Sexual activity:  Males; last STD Gonorrhea at age 74.  Syphilis in twenties.     ROS  PHYSICAL EXAMINATION:    LMP 10/17/2016 (Exact Date)     General appearance: alert, cooperative and appears stated age Neck: no adenopathy, supple, symmetrical, trachea midline  and thyroid {CHL AMB PHY EX THYROID NORM DEFAULT:[phone]::"normal to inspection and palpation"} Breasts: {Exam; breast:13139::"normal appearance, no masses or tenderness"} Abdomen: soft, non-tender; bowel sounds normal; no masses,  no organomegaly  Pelvic: External genitalia:  no lesions              Urethra:  normal appearing urethra with no masses, tenderness or lesions              Bartholins and Skenes: normal                 Vagina: normal appearing vagina with normal color and discharge, no lesions              Cervix: {CHL AMB PHY EX CERVIX NORM DEFAULT:[phone]::"no lesions"}              Bimanual Exam:  Uterus:  {CHL AMB PHY EX UTERUS NORM [phone]::"normal size, contour, position, consistency, mobility, non-tender"}              Adnexa: {CHL AMB PHY EX ADNEXA NO MASS DEFAULT:[phone]::"no mass, fullness, tenderness"}              Rectovaginal: {yes no:[id number]}.  Confirms.              Anus:  normal sphincter tone, no lesions  Chaperone was present for exam.  ASSESSMENT     PLAN    An After Visit Summary was printed and given to the patient.  *** minutes face to face time of which over 50% was spent in counseling.

## 2016-12-09 ENCOUNTER — Telehealth: Payer: Self-pay | Admitting: Obstetrics and Gynecology

## 2016-12-09 ENCOUNTER — Ambulatory Visit: Payer: BLUE CROSS/BLUE SHIELD | Admitting: Obstetrics and Gynecology

## 2016-12-09 ENCOUNTER — Encounter: Payer: Self-pay | Admitting: Obstetrics and Gynecology

## 2016-12-09 NOTE — Telephone Encounter (Signed)
Please don't charge the patient a no show fee.

## 2016-12-09 NOTE — Telephone Encounter (Signed)
Patient called and cancelled her appointment for this afternoon for a recheck with Dr. Talbert Nan. She said she went to start her car to come to her visit and it won't start. She rescheduled to Monday, 12/12/16.

## 2016-12-12 ENCOUNTER — Encounter: Payer: Self-pay | Admitting: Obstetrics and Gynecology

## 2016-12-12 ENCOUNTER — Ambulatory Visit (INDEPENDENT_AMBULATORY_CARE_PROVIDER_SITE_OTHER): Payer: BLUE CROSS/BLUE SHIELD | Admitting: Obstetrics and Gynecology

## 2016-12-12 VITALS — BP 132/80 | HR 76 | Resp 14 | Wt 245.0 lb

## 2016-12-12 DIAGNOSIS — Z90710 Acquired absence of both cervix and uterus: Secondary | ICD-10-CM

## 2016-12-12 NOTE — Progress Notes (Signed)
GYNECOLOGY  VISIT   HPI: 46 y.o.   Single  African American  female   [phone] with Patient's last menstrual period was 10/17/2016 (exact date).   here for follow up s/p TLH. Surgery was 5 weeks ago. Last weeks she bleed for 2 days, no source on exam. No further bleeding. She is feeling better now. She feels back to normal. Ready to go back to work. Voiding well, finally back to normal with BM's.   GYNECOLOGIC HISTORY: Patient's last menstrual period was 10/17/2016 (exact date). Contraception:hysterectomy  Menopausal hormone therapy: none         OB History    Gravida Para Term Preterm AB Living   4 2 2   2 2    SAB TAB Ectopic Multiple Live Births   1       2         Patient Active Problem List   Diagnosis Date Noted  . Status post laparoscopic hysterectomy 11/08/2016  . Severe obesity (BMI >= 40) ([place]) 07/28/2014  . Dizziness and giddiness 03/18/2013  . Essential hypertension, benign 03/18/2013  . Breast pain, left 03/18/2013    Past Medical History:  Diagnosis Date  . Allergy   . Hypertension   . PONV (postoperative nausea and vomiting)     Past Surgical History:  Procedure Laterality Date  . ABDOMINAL HYSTERECTOMY    . CYSTOSCOPY N/A 11/08/2016   Procedure: CYSTOSCOPY;  Surgeon: Salvadore Dom, MD;  Location: [facility];  Service: Gynecology;  Laterality: N/A;  . DILATION AND EVACUATION     miscarriage  . TOTAL LAPAROSCOPIC HYSTERECTOMY WITH SALPINGECTOMY Bilateral 11/08/2016   Procedure: HYSTERECTOMY TOTAL LAPAROSCOPIC WITH BILATERAL SALPINGECTOMY;  Surgeon: Salvadore Dom, MD;  Location: [facility];  Service: Gynecology;  Laterality: Bilateral;  . TUBAL LIGATION      Current Outpatient Prescriptions  Medication Sig Dispense Refill  . ibuprofen (ADVIL,MOTRIN) 600 MG tablet Take 1 tablet (600 mg total) by mouth every 8 (eight) hours as needed. 30 tablet 0  . Multiple Vitamin (MULTIVITAMIN WITH MINERALS) TABS tablet Take 1 tablet by mouth 4 (four) times a week.      . triamterene-hydrochlorothiazide (MAXZIDE-25) 37.5-25 MG tablet Take 1 tablet by mouth daily. (Patient taking differently: Take 1 tablet by mouth daily at 6 (six) AM. ) 30 tablet 5   No current facility-administered medications for this visit.      ALLERGIES: Patient has no known allergies.  Family History  Problem Relation Age of Onset  . Diabetes Mother   . Hypertension Mother   . Cancer Mother        liver   . Hyperlipidemia Mother   . Stroke Mother   . Diabetes Maternal Grandmother   . Hypertension Maternal Grandmother   . Hyperlipidemia Maternal Grandmother   . Heart disease Maternal Grandmother   . Hypertension Sister   . Cancer Maternal Grandfather        prostate  . Diabetes Maternal Grandfather   . Hyperlipidemia Maternal Grandfather   . Hypertension Maternal Grandfather   . Colon cancer Maternal Grandfather   . Diabetes Sister   . Diabetes Maternal Uncle     Social History   Social History  . Marital status: Single    Spouse name: N/A  . Number of children: N/A  . Years of education: N/A   Occupational History  . Not on file.   Social History Main Topics  . Smoking status: Never Smoker  . Smokeless tobacco: Never Used  .  Alcohol use 1.8 oz/week    3 Standard drinks or equivalent per week  . Drug use: Yes    Types: Marijuana     Comment: occ  . Sexual activity: Not Currently    Partners: Male    Birth control/ protection: Surgical   Other Topics Concern  . Not on file   Social History Narrative   Marital status: single; not dating.      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      Tobacco: none      Alcohol:  Socially; rarely.      Drugs: none; marijuana in the past.      Exercise: none      History of incarceration for six years (guilt by association; friend robbed bank).      Sexual activity:  Males; last STD Gonorrhea at age 40.  Syphilis in  twenties.     Review of Systems  Constitutional: Negative.   HENT: Negative.   Eyes: Negative.   Respiratory: Negative.   Cardiovascular: Negative.   Gastrointestinal: Negative.   Genitourinary: Negative.   Musculoskeletal: Negative.   Skin: Negative.   Neurological: Negative.   Endo/Heme/Allergies: Negative.   Psychiatric/Behavioral: Negative.     PHYSICAL EXAMINATION:    BP 132/80 (BP Location: Right Arm, Patient Position: Sitting, Cuff Size: Normal)   Pulse 76   Resp 14   Wt 245 lb (111.1 kg)   LMP 10/17/2016 (Exact Date)   BMI 38.09 kg/m     General appearance: alert, cooperative and appears stated age Abdomen: soft, non-tender; non distended; no masses,  no organomegaly Incisions are well healed   ASSESSMENT 5 weeks post op s/p TLH, doing well, no further bleeding    PLAN Return to work F/U for an annual exam in 4/19   An After Visit Summary was printed and given to the patient.

## 2017-01-26 ENCOUNTER — Ambulatory Visit: Payer: BLUE CROSS/BLUE SHIELD | Admitting: Family Medicine

## 2017-02-27 ENCOUNTER — Ambulatory Visit: Payer: BLUE CROSS/BLUE SHIELD | Admitting: Family Medicine

## 2017-03-08 ENCOUNTER — Ambulatory Visit (INDEPENDENT_AMBULATORY_CARE_PROVIDER_SITE_OTHER): Payer: BLUE CROSS/BLUE SHIELD | Admitting: Family Medicine

## 2017-03-08 ENCOUNTER — Encounter: Payer: Self-pay | Admitting: Family Medicine

## 2017-03-08 VITALS — BP 132/72 | HR 84 | Temp 98.6°F | Resp 16 | Ht 67.0 in | Wt 249.0 lb

## 2017-03-08 DIAGNOSIS — F411 Generalized anxiety disorder: Secondary | ICD-10-CM

## 2017-03-08 DIAGNOSIS — I10 Essential (primary) hypertension: Secondary | ICD-10-CM | POA: Diagnosis not present

## 2017-03-08 LAB — BASIC METABOLIC PANEL WITH GFR
BUN/Creatinine Ratio: 13 (calc) (ref 6–22)
BUN: 20 mg/dL (ref 7–25)
CALCIUM: 9.5 mg/dL (ref 8.6–10.2)
CHLORIDE: 106 mmol/L (ref 98–110)
CO2: 24 mmol/L (ref 20–32)
CREATININE: 1.57 mg/dL — AB (ref 0.50–1.10)
GFR, EST AFRICAN AMERICAN: 45 mL/min/{1.73_m2} — AB (ref >=60)
GFR, EST NON AFRICAN AMERICAN: 39 mL/min/{1.73_m2} — AB (ref >=60)
Glucose, Bld: 116 mg/dL — ABNORMAL HIGH (ref 65–99)
POTASSIUM: 4.1 mmol/L (ref 3.5–5.3)
SODIUM: 140 mmol/L (ref 135–146)

## 2017-03-08 LAB — EXTRA LAV TOP TUBE

## 2017-03-08 MED ORDER — BUSPIRONE HCL 5 MG PO TABS: 5.0000 mg | ORAL_TABLET | Freq: Two times a day (BID) | ORAL | 5 refills | Status: DC

## 2017-03-08 MED ORDER — TRIAMTERENE-HCTZ 37.5-25 MG PO TABS: 1.0000 | ORAL_TABLET | Freq: Every day | ORAL | 5 refills | Status: DC

## 2017-03-08 NOTE — Progress Notes (Signed)
Subjective:     Abigail Beasley is a 46 y.o. female with a history of hypertension presents complaining of anxiety. She has the following anxiety symptoms: difficulty concentrating, feelings of losing control and racing thoughts. Onset of symptoms was approximately several months ago. She denies current suicidal and homicidal ideation.    Patient here for a follow up of hypertension. She is not exercising and is not adherent to low salt diet. She does not check blood pressures at home. Patient denies chest pain, dyspnea, fatigue, lower extremity edema, orthopnea, palpitations, syncope and tachypnea.  Cardiovascular risk factors: obesity (BMI >= 30 kg/m2) and sedentary lifestyle.  Past Medical History:  Diagnosis Date  . Allergy   . Hypertension   . PONV (postoperative nausea and vomiting)    Social History   Social History  . Marital status: Single    Spouse name: N/A  . Number of children: N/A  . Years of education: N/A   Occupational History  . Not on file.   Social History Main Topics  . Smoking status: Never Smoker  . Smokeless tobacco: Never Used  . Alcohol use 1.8 oz/week    3 Standard drinks or equivalent per week  . Drug use: Yes    Types: Marijuana     Comment: occ  . Sexual activity: Not Currently    Partners: Male    Birth control/ protection: Surgical   Other Topics Concern  . Not on file   Social History Narrative   Marital status: single; not dating.      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      Tobacco: none      Alcohol:  Socially; rarely.      Drugs: none; marijuana in the past.      Exercise: none      History of incarceration for six years (guilt by association; friend robbed bank).      Sexual activity:  Males; last STD Gonorrhea at age 30.  Syphilis in twenties.    Immunization History  Administered Date(s) Administered  . Hepatitis A, Adult 09/08/2014   . Hepatitis B, adult 09/08/2014  . Influenza Split 02/10/2015  . Influenza-Unspecified 02/27/2014  . Tdap 05/31/2011  Review of Systems  Constitutional: Negative.   HENT: Negative.   Eyes: Negative.   Respiratory: Negative.   Cardiovascular: Negative.   Gastrointestinal: Negative.   Genitourinary: Negative.   Musculoskeletal: Negative.   Skin: Negative.   Neurological: Negative.   Endo/Heme/Allergies: Negative.   Psychiatric/Behavioral: The patient is nervous/anxious.     Objective:  Physical Exam  Constitutional: She is oriented to person, place, and time and well-developed, well-nourished, and in no distress.  HENT:  Head: Normocephalic and atraumatic.  Right Ear: External ear normal.  Left Ear: External ear normal.  Mouth/Throat: Oropharynx is clear and moist.  Eyes: Pupils are equal, round, and reactive to light.  Neck: Normal range of motion. Neck supple.  Abdominal: Soft. Bowel sounds are normal.  Neurological: She is alert and oriented to person, place, and time. Gait normal.  Skin: Skin is warm and dry.  Psychiatric: Memory and judgment normal. Her mood appears anxious. She is agitated. She expresses no homicidal and no suicidal ideation. She has a flat affect.   Assessment:  BP 132/72 (BP Location: Left Arm, Patient Position: Sitting, Cuff Size: Large) Comment: manually  Pulse 84  Temp 98.6 F (37 C) (Oral)   Resp 16   Ht 5\' 7"  (1.702 m)   Wt 249 lb (112.9 kg)   LMP 10/17/2016 (Exact Date)   SpO2 98%   BMI 39.00 kg/m  Plan:   1. Essential hypertension, benign Blood pressure is at goal on current medication regimen Renal functioning at goal  - triamterene-hydrochlorothiazide (MAXZIDE-25) 37.5-25 MG tablet; Take 1 tablet by mouth daily.  Dispense: 30 tablet; Refill: 5 - BASIC METABOLIC PANEL WITH GFR  2. GAD (generalized anxiety disorder) GAD 7 : Generalized Anxiety Score 03/08/2017  Nervous, Anxious, on Edge 2  Control/stop worrying 2  Worry too much  - different things 2  Trouble relaxing 3  Restless 3  Easily annoyed or irritable 2  Afraid - awful might happen 3  Total GAD 7 Score 17  Anxiety Difficulty Somewhat difficult    - Ambulatory referral to Psychology - busPIRone (BUSPAR) 5 MG tablet; Take 1 tablet (5 mg total) by mouth 2 (two) times daily.  Dispense: 60 tablet; Refill: 5  Reviewed concept of anxiety as biochemical imbalance of neurotransmitters and rationale for treatment. Instructed patient to contact office or on-call physician promptly should condition worsen or any new symptoms appear and provided on-call telephone numbers. IF THE PATIENT HAS ANY SUICIDAL OR HOMICIDAL IDEATIONS, CALL THE OFFICE, DISCUSS WITH A SUPPORT MEMBER, OR GO TO THE ER IMMEDIATELY. Patient was agreeable with this plan.     RTC: 2 months for anxiety  [name]  MSN, FNP-C [facility] [address] [place], [place] [address] [phone]

## 2017-03-08 NOTE — Patient Instructions (Signed)
Generalized Anxiety Disorder, Adult Generalized anxiety disorder (GAD) is a mental health disorder. People with this condition constantly worry about everyday events. Unlike normal anxiety, worry related to GAD is not triggered by a specific event. These worries also do not fade or get better with time. GAD interferes with life functions, including relationships, work, and school. GAD can vary from mild to severe. People with severe GAD can have intense waves of anxiety with physical symptoms (panic attacks). What are the causes? The exact cause of GAD is not known. What increases the risk? This condition is more likely to develop in:  Women.  People who have a family history of anxiety disorders.  People who are very shy.  People who experience very stressful life events, such as the death of a loved one.  People who have a very stressful family environment.  What are the signs or symptoms? People with GAD often worry excessively about many things in their lives, such as their health and family. They may also be overly concerned about:  Doing well at work.  Being on time.  Natural disasters.  Friendships.  Physical symptoms of GAD include:  Fatigue.  Muscle tension or having muscle twitches.  Trembling or feeling shaky.  Being easily startled.  Feeling like your heart is pounding or racing.  Feeling out of breath or like you cannot take a deep breath.  Having trouble falling asleep or staying asleep.  Sweating.  Nausea, diarrhea, or irritable bowel syndrome (IBS).  Headaches.  Trouble concentrating or remembering facts.  Restlessness.  Irritability.  How is this diagnosed? Your health care provider can diagnose GAD based on your symptoms and medical history. You will also have a physical exam. The health care provider will ask specific questions about your symptoms, including how severe they are, when they started, and if they come and go. Your health care  provider may ask you about your use of alcohol or drugs, including prescription medicines. Your health care provider may refer you to a mental health specialist for further evaluation. Your health care provider will do a thorough examination and may perform additional tests to rule out other possible causes of your symptoms. To be diagnosed with GAD, a person must have anxiety that:  Is out of his or her control.  Affects several different aspects of his or her life, such as work and relationships.  Causes distress that makes him or her unable to take part in normal activities.  Includes at least three physical symptoms of GAD, such as restlessness, fatigue, trouble concentrating, irritability, muscle tension, or sleep problems.  Before your health care provider can confirm a diagnosis of GAD, these symptoms must be present more days than they are not, and they must last for six months or longer. How is this treated? The following therapies are usually used to treat GAD:  Medicine. Antidepressant medicine is usually prescribed for long-term daily control. Antianxiety medicines may be added in severe cases, especially when panic attacks occur.  Talk therapy (psychotherapy). Certain types of talk therapy can be helpful in treating GAD by providing support, education, and guidance. Options include: ? Cognitive behavioral therapy (CBT). People learn coping skills and techniques to ease their anxiety. They learn to identify unrealistic or negative thoughts and behaviors and to replace them with positive ones. ? Acceptance and commitment therapy (ACT). This treatment teaches people how to be mindful as a way to cope with unwanted thoughts and feelings. ? Biofeedback. This process trains you to   manage your body's response (physiological response) through breathing techniques and relaxation methods. You will work with a therapist while machines are used to monitor your physical symptoms.  Stress  management techniques. These include yoga, meditation, and exercise.  A mental health specialist can help determine which treatment is best for you. Some people see improvement with one type of therapy. However, other people require a combination of therapies. Follow these instructions at home:  Take over-the-counter and prescription medicines only as told by your health care provider.  Try to maintain a normal routine.  Try to anticipate stressful situations and allow extra time to manage them.  Practice any stress management or self-calming techniques as taught by your health care provider.  Do not punish yourself for setbacks or for not making progress.  Try to recognize your accomplishments, even if they are small.  Keep all follow-up visits as told by your health care provider. This is important. Contact a health care provider if:  Your symptoms do not get better.  Your symptoms get worse.  You have signs of depression, such as: ? A persistently sad, cranky, or irritable mood. ? Loss of enjoyment in activities that used to bring you joy. ? Change in weight or eating. ? Changes in sleeping habits. ? Avoiding friends or family members. ? Loss of energy for normal tasks. ? Feelings of guilt or worthlessness. Get help right away if:  You have serious thoughts about hurting yourself or others. If you ever feel like you may hurt yourself or others, or have thoughts about taking your own life, get help right away. You can go to your nearest emergency department or call:  Your local emergency services (911 in the [place]  A suicide crisis helpline, such as the National Suicide Prevention Lifeline at 1-800-273-8255. This is open 24 hours a day.  Summary  Generalized anxiety disorder (GAD) is a mental health disorder that involves worry that is not triggered by a specific event.  People with GAD often worry excessively about many things in their lives, such as their health and  family.  GAD may cause physical symptoms such as restlessness, trouble concentrating, sleep problems, frequent sweating, nausea, diarrhea, headaches, and trembling or muscle twitching.  A mental health specialist can help determine which treatment is best for you. Some people see improvement with one type of therapy. However, other people require a combination of therapies. This information is not intended to replace advice given to you by your health care provider. Make sure you discuss any questions you have with your health care provider. Document Released: 09/10/2012 Document Revised: 04/05/2016 Document Reviewed: 04/05/2016 Elsevier Interactive Patient Education  2018 Else[employer] Buspirone tablets What is this medicine? BUSPIRONE (byoo SPYE rone) is used to treat anxiety disorders. This medicine may be used for other purposes; ask your health care provider or pharmacist if you have questions. COMMON BRAND NAME(S): BuSpar What should I tell my health care provider before I take this medicine? They need to know if you have any of these conditions: -kidney or liver disease -an unusual or allergic reaction to buspirone, other medicines, foods, dyes, or preservatives -pregnant or trying to get pregnant -breast-feeding How should I use this medicine? Take this medicine by mouth with a glass of water. Follow the directions on the prescription label. You may take this medicine with or without food. To ensure that this medicine always works the same way for you, you should take it either always with or always without food.   Take your doses at regular intervals. Do not take your medicine more often than directed. Do not stop taking except on the advice of your doctor or health care professional. Talk to your pediatrician regarding the use of this medicine in children. Special care may be needed. Overdosage: If you think you have taken too much of this medicine contact a poison control center or  emergency room at once. NOTE: This medicine is only for you. Do not share this medicine with others. What if I miss a dose? If you miss a dose, take it as soon as you can. If it is almost time for your next dose, take only that dose. Do not take double or extra doses. What may interact with this medicine? Do not take this medicine with any of the following medications: -linezolid -MAOIs like Carbex, Eldepryl, Marplan, Nardil, and Parnate -methylene blue -procarbazine This medicine may also interact with the following medications: -diazepam -digoxin -diltiazem -erythromycin -grapefruit juice -haloperidol -medicines for mental depression or mood problems -medicines for seizures like carbamazepine, phenobarbital and phenytoin -nefazodone -other medications for anxiety -rifampin -ritonavir -some antifungal medicines like itraconazole, ketoconazole, and voriconazole -verapamil -warfarin This list may not describe all possible interactions. Give your health care provider a list of all the medicines, herbs, non-prescription drugs, or dietary supplements you use. Also tell them if you smoke, drink alcohol, or use illegal drugs. Some items may interact with your medicine. What should I watch for while using this medicine? Visit your doctor or health care professional for regular checks on your progress. It may take 1 to 2 weeks before your anxiety gets better. You may get drowsy or dizzy. Do not drive, use machinery, or do anything that needs mental alertness until you know how this drug affects you. Do not stand or sit up quickly, especially if you are an older patient. This reduces the risk of dizzy or fainting spells. Alcohol can make you more drowsy and dizzy. Avoid alcoholic drinks. What side effects may I notice from receiving this medicine? Side effects that you should report to your doctor or health care professional as soon as possible: -blurred vision or other vision changes -chest  pain -confusion -difficulty breathing -feelings of hostility or anger -muscle aches and pains -numbness or tingling in hands or feet -ringing in the ears -skin rash and itching -vomiting -weakness Side effects that usually do not require medical attention (report to your doctor or health care professional if they continue or are bothersome): -disturbed dreams, nightmares -headache -nausea -restlessness or nervousness -sore throat and nasal congestion -stomach upset This list may not describe all possible side effects. Call your doctor for medical advice about side effects. You may report side effects to FDA at 1-800-FDA-1088. Where should I keep my medicine? Keep out of the reach of children. Store at room temperature below 30 degrees C (86 degrees F). Protect from light. Keep container tightly closed. Throw away any unused medicine after the expiration date. NOTE: This sheet is a summary. It may not cover all possible information. If you have questions about this medicine, talk to your doctor, pharmacist, or health care provider.  2018 Elsevier/Gold Standard (2009-12-24 18:06:11)  

## 2017-03-09 LAB — POCT URINALYSIS DIP (DEVICE)
Bilirubin Urine: NEGATIVE
GLUCOSE, UA: NEGATIVE mg/dL
Hgb urine dipstick: NEGATIVE
Ketones, ur: NEGATIVE mg/dL
NITRITE: NEGATIVE
PROTEIN: NEGATIVE mg/dL
SPECIFIC GRAVITY, URINE: 1.02 (ref 1.005–1.030)
UROBILINOGEN UA: 0.2 mg/dL (ref 0.0–1.0)
pH: 5.5 (ref 5.0–8.0)

## 2017-03-24 ENCOUNTER — Encounter: Payer: Self-pay | Admitting: Obstetrics and Gynecology

## 2017-03-27 ENCOUNTER — Telehealth: Payer: Self-pay

## 2017-03-27 DIAGNOSIS — I10 Essential (primary) hypertension: Secondary | ICD-10-CM

## 2017-03-27 MED ORDER — TRIAMTERENE-HCTZ 37.5-25 MG PO TABS: 1.0000 | ORAL_TABLET | Freq: Every day | ORAL | 5 refills | Status: DC

## 2017-03-27 NOTE — Telephone Encounter (Signed)
This has been sent into pharmacy. Thanks!  

## 2017-04-18 ENCOUNTER — Ambulatory Visit: Payer: BLUE CROSS/BLUE SHIELD | Admitting: Clinical

## 2017-05-08 ENCOUNTER — Ambulatory Visit: Payer: BLUE CROSS/BLUE SHIELD | Admitting: Family Medicine

## 2017-05-17 ENCOUNTER — Ambulatory Visit: Payer: BLUE CROSS/BLUE SHIELD | Admitting: Family Medicine

## 2017-09-18 ENCOUNTER — Encounter: Payer: Self-pay | Admitting: Obstetrics and Gynecology

## 2017-09-18 ENCOUNTER — Ambulatory Visit: Payer: BLUE CROSS/BLUE SHIELD | Admitting: Obstetrics and Gynecology

## 2017-09-18 NOTE — Progress Notes (Deleted)
47 y.o. [id number] SingleAfrican AmericanF here for annual exam.      Patient's last menstrual period was 10/17/2016 (exact date).          Sexually active: {yes no:314532}  The current method of family planning is status post hysterectomy.    Exercising: {yes no:314532}  {types:19826} Smoker:  {YES [id number]  Health Maintenance: Pap:  10/19/16 Neg. HR HPV:neg   09/08/14 ASCUS. HR HPV:neg  History of abnormal Pap:  yes MMG:  09/25/14 Diagnostic right BIRADS1:neg  Colonoscopy: never BMD:   never TDaP:  2013 Gardasil: n/a   reports that she has never smoked. She has never used smokeless tobacco. She reports that she drinks about 1.8 oz of alcohol per week. She reports that she has current or past drug history. Drug: Marijuana.  Past Medical History:  Diagnosis Date  . Allergy   . Hypertension   . PONV (postoperative nausea and vomiting)     Past Surgical History:  Procedure Laterality Date  . ABDOMINAL HYSTERECTOMY    . CYSTOSCOPY N/A 11/08/2016   Procedure: CYSTOSCOPY;  Surgeon: Salvadore Dom, MD;  Location: [facility];  Service: Gynecology;  Laterality: N/A;  . DILATION AND EVACUATION     miscarriage  . TOTAL LAPAROSCOPIC HYSTERECTOMY WITH SALPINGECTOMY Bilateral 11/08/2016   Procedure: HYSTERECTOMY TOTAL LAPAROSCOPIC WITH BILATERAL SALPINGECTOMY;  Surgeon: Salvadore Dom, MD;  Location: [facility];  Service: Gynecology;  Laterality: Bilateral;  . TUBAL LIGATION      Current Outpatient Medications  Medication Sig Dispense Refill  . busPIRone (BUSPAR) 5 MG tablet Take 1 tablet (5 mg total) by mouth 2 (two) times daily. 60 tablet 5  . Multiple Vitamin (MULTIVITAMIN WITH MINERALS) TABS tablet Take 1 tablet by mouth 4 (four) times a week.     . triamterene-hydrochlorothiazide (MAXZIDE-25) 37.5-25 MG tablet Take 1 tablet by mouth daily. 30 tablet 5   No current facility-administered medications for this visit.     Family History  Problem Relation Age of Onset  . Diabetes  Mother   . Hypertension Mother   . Cancer Mother        liver   . Hyperlipidemia Mother   . Stroke Mother   . Diabetes Maternal Grandmother   . Hypertension Maternal Grandmother   . Hyperlipidemia Maternal Grandmother   . Heart disease Maternal Grandmother   . Hypertension Sister   . Cancer Maternal Grandfather        prostate  . Diabetes Maternal Grandfather   . Hyperlipidemia Maternal Grandfather   . Hypertension Maternal Grandfather   . Colon cancer Maternal Grandfather   . Diabetes Sister   . Diabetes Maternal Uncle     Review of Systems  Exam:   LMP 10/17/2016 (Exact Date)   Weight change: @WEIGHTCHANGE @ Height:      Ht Readings from Last 3 Encounters:  03/08/17 5\' 7"  (1.702 m)  11/15/16 5' 7.25" (1.708 m)  11/08/16 5\' 7"  (1.702 m)    General appearance: alert, cooperative and appears stated age Head: Normocephalic, without obvious abnormality, atraumatic Neck: no adenopathy, supple, symmetrical, trachea midline and thyroid {CHL AMB PHY EX THYROID NORM [phone]::"normal to inspection and palpation"} Lungs: clear to auscultation bilaterally Cardiovascular: regular rate and rhythm Breasts: {Exam; breast:13139::"normal appearance, no masses or tenderness"} Abdomen: soft, non-tender; non distended,  no masses,  no organomegaly Extremities: extremities normal, atraumatic, no cyanosis or edema Skin: Skin color, texture, turgor normal. No rashes or lesions Lymph nodes: Cervical, supraclavicular, and axillary nodes normal. No abnormal inguinal nodes  palpated Neurologic: Grossly normal   Pelvic: External genitalia:  no lesions              Urethra:  normal appearing urethra with no masses, tenderness or lesions              Bartholins and Skenes: normal                 Vagina: normal appearing vagina with normal color and discharge, no lesions              Cervix: {CHL AMB PHY EX CERVIX NORM DEFAULT:[phone]::"no lesions"}               Bimanual Exam:   Uterus:  {CHL AMB PHY EX UTERUS NORM DEFAULT:[phone]::"normal size, contour, position, consistency, mobility, non-tender"}              Adnexa: {CHL AMB PHY EX ADNEXA NO MASS DEFAULT:[phone]::"no mass, fullness, tenderness"}               Rectovaginal: Confirms               Anus:  normal sphincter tone, no lesions  Chaperone was present for exam.  A:  Well Woman with normal exam  P:

## 2017-09-19 ENCOUNTER — Encounter: Payer: Self-pay | Admitting: Obstetrics and Gynecology

## 2017-10-18 ENCOUNTER — Ambulatory Visit: Payer: Self-pay | Admitting: Family Medicine

## 2017-11-01 ENCOUNTER — Encounter: Payer: Self-pay | Admitting: Family Medicine

## 2017-11-01 ENCOUNTER — Ambulatory Visit (INDEPENDENT_AMBULATORY_CARE_PROVIDER_SITE_OTHER): Payer: Self-pay | Admitting: Family Medicine

## 2017-11-01 VITALS — BP 138/81 | HR 67 | Temp 98.6°F | Resp 16 | Ht 67.0 in | Wt 256.0 lb

## 2017-11-01 DIAGNOSIS — G629 Polyneuropathy, unspecified: Secondary | ICD-10-CM

## 2017-11-01 DIAGNOSIS — R5383 Other fatigue: Secondary | ICD-10-CM

## 2017-11-01 DIAGNOSIS — I10 Essential (primary) hypertension: Secondary | ICD-10-CM

## 2017-11-01 LAB — POCT GLYCOSYLATED HEMOGLOBIN (HGB A1C): Hemoglobin A1C: 5.3 % (ref 4.0–5.6)

## 2017-11-01 LAB — POCT URINALYSIS DIPSTICK
BILIRUBIN UA: NEGATIVE
GLUCOSE UA: NEGATIVE
Ketones, UA: NEGATIVE
Leukocytes, UA: NEGATIVE
Nitrite, UA: NEGATIVE
Protein, UA: NEGATIVE
RBC UA: NEGATIVE
Spec Grav, UA: 1.015 (ref 1.010–1.025)
Urobilinogen, UA: 1 E.U./dL
pH, UA: 7 (ref 5.0–8.0)

## 2017-11-01 MED ORDER — GABAPENTIN 300 MG PO CAPS: 300.0000 mg | ORAL_CAPSULE | Freq: Every day | ORAL | 1 refills | Status: DC

## 2017-11-01 MED ORDER — TRIAMTERENE-HCTZ 37.5-25 MG PO TABS: 1.0000 | ORAL_TABLET | Freq: Every day | ORAL | 5 refills | Status: DC

## 2017-11-01 NOTE — Progress Notes (Signed)
Subjective:    Patient ID: Abigail Beasley, female    DOB: 1970-10-28, 47 y.o.   MRN: [id number]  HPI Abigail Beasley, a 47 year old female with a history of hypertension and obesity presents complaining of numbness and tingling to hands bilaterally.  Symptoms of been occurring over the last several months.  She has not identified any palliative or provocative factors relating to current symptoms. Symptoms are currently of moderate and intermittent severity. Symptoms occur intermittently and last an unspecified amount of time.  The patient denies headache, dizziness, recent falls, abnormal gait, or infection.  Symptoms are symmetric. Abigail Beasley is right hand dominant.  Patient has not been evaluated for this condition in the past.  Patient also has a history of hypertension, which has been controlled on current medication regimen. Patient does not exercise routinely or follow a lowfat, low sodium diet. Body mass index is 40.1 kg/m. Abigail Beasley endorses fatigue. She denies headache, shortness of breath, chest pain, heart palpitations, dysuria, bilateral lower extremity edema, nausea, vomiting, or diarrhea.    Past Medical History:  Diagnosis Date  . Allergy   . Hypertension   . PONV (postoperative nausea and vomiting)    Social History   Socioeconomic History  . Marital status: Single    Spouse name: Not on file  . Number of children: Not on file  . Years of education: Not on file  . Highest education level: Not on file  Occupational History  . Not on file  Social Needs  . Financial resource strain: Not on file  . Food insecurity:    Worry: Not on file    Inability: Not on file  . Transportation needs:    Medical: Not on file    Non-medical: Not on file  Tobacco Use  . Smoking status: Never Smoker  . Smokeless tobacco: Never Used  Substance and Sexual Activity  . Alcohol use: Yes    Alcohol/week: 1.8 oz    Types: 3 Standard drinks or equivalent per week  . Drug use: Yes    Types: Marijuana   Comment: occ  . Sexual activity: Not Currently    Partners: Male    Birth control/protection: Surgical  Lifestyle  . Physical activity:    Days per week: Not on file    Minutes per session: Not on file  . Stress: Not on file  Relationships  . Social connections:    Talks on phone: Not on file    Gets together: Not on file    Attends religious service: Not on file    Active member of club or organization: Not on file    Attends meetings of clubs or organizations: Not on file    Relationship status: Not on file  . Intimate partner violence:    Fear of current or ex partner: Not on file    Emotionally abused: Not on file    Physically abused: Not on file    Forced sexual activity: Not on file  Other Topics Concern  . Not on file  Social History Narrative   Marital status: single; not dating.      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      Tobacco: none      Alcohol:  Socially; rarely.      Drugs: none; marijuana in the past.      Exercise: none  History of incarceration for six years (guilt by association; friend robbed bank).      Sexual activity:  Males; last STD Gonorrhea at age 1.  Syphilis in twenties.    Immunization History  Administered Date(s) Administered  . Hepatitis A, Adult 09/08/2014  . Hepatitis B, adult 09/08/2014  . Influenza Split 02/10/2015  . Influenza-Unspecified 02/27/2014  . Tdap 05/31/2011    Review of Systems  Constitutional: Negative.   HENT: Negative.   Eyes: Negative for photophobia and visual disturbance.  Respiratory: Negative.   Cardiovascular: Negative.   Gastrointestinal: Negative.   Endocrine: Negative.   Genitourinary: Negative.   Musculoskeletal: Negative.   Skin: Negative.   Neurological: Positive for numbness (bilateral hands).  Psychiatric/Behavioral: Negative.        Objective:   Physical Exam  Constitutional: She is  oriented to person, place, and time. She appears well-developed and well-nourished.  HENT:  Head: Normocephalic.  Eyes: Pupils are equal, round, and reactive to light.  Neck: Normal range of motion.  Cardiovascular: Normal rate, regular rhythm, normal heart sounds and intact distal pulses.  Pulmonary/Chest: Effort normal and breath sounds normal.  Abdominal: Soft.  Neurological: She is alert and oriented to person, place, and time.  Skin: Skin is warm and dry.  Psychiatric: She has a normal mood and affect. Her behavior is normal. Judgment and thought content normal.      BP 138/81 (BP Location: Left Arm, Patient Position: Sitting, Cuff Size: Large)   Pulse 67   Temp 98.6 F (37 C) (Oral)   Resp 16   Ht 5\' 7"  (1.702 m)   Wt 256 lb (116.1 kg)   LMP 10/17/2016 (Exact Date)   SpO2 100%   BMI 40.10 kg/m  Assessment & Plan:  1. Essential hypertension, benign Blood pressure is at goal on current medication regimen. No medication changes warranted on today. We have discussed target BP range and blood pressure goal. I have advised patient to check BP regularly and to call us back or report to clinic if the numbers are consistently higher than 140/90. We discussed the importance of compliance with medical therapy and DASH diet recommended, consequences of uncontrolled hypertension discussed.  - continue current BP medications  - Urinalysis Dipstick - Basic Metabolic Panel - triamterene-hydrochlorothiazide (MAXZIDE-25) 37.5-25 MG tablet; Take 1 tablet by mouth daily.  Dispense: 30 tablet; Refill: 5  2. Other fatigue  - HgB A1c - CBC with Differential - TSH  3. Neuropathy Will start a trial of gabapentin 300 mg at bedtime.  - gabapentin (NEURONTIN) 300 MG capsule; Take 1 capsule (300 mg total) by mouth at bedtime.  Dispense: 30 capsule; Refill: 1   RTC: 6 months for hypertension.   [name]  MSN, FNP-C [facility] [address]  [place], [place] [address] [phone]

## 2017-11-01 NOTE — Patient Instructions (Signed)
I suspect that you have neuropathy in the upper extremities, will start a trial of gabapentin 300 mg at bedtime.  We will follow-up by phone with any abnormal laboratory results. Your blood pressure is at goal on current medication regimen, no changes warranted on today.- Continue medication, monitor blood pressure at home. Continue DASH diet. Reminder to go to the ER if any CP, SOB, nausea, dizziness, severe HA, changes vision/speech, left arm numbness and tingling and jaw pain.      Due to your history of rectal bleeding and family history of colon cancer.  I recommend that you follow-up in 3 weeks for a rectal exam.  Also, complete financial assistance forms located in the lobby for payer source.  You warrant a colonoscopy as soon as possible.

## 2017-11-02 LAB — CBC WITH DIFFERENTIAL/PLATELET
Basophils Absolute: 0 10*3/uL (ref 0.0–0.2)
Basos: 1 %
EOS (ABSOLUTE): 0.2 10*3/uL (ref 0.0–0.4)
Eos: 3 %
Hematocrit: 43 % (ref 34.0–46.6)
Hemoglobin: 14.3 g/dL (ref 11.1–15.9)
IMMATURE GRANS (ABS): 0 10*3/uL (ref 0.0–0.1)
IMMATURE GRANULOCYTES: 0 %
LYMPHS: 22 %
Lymphocytes Absolute: 1.4 10*3/uL (ref 0.7–3.1)
MCH: 30.3 pg (ref 26.6–33.0)
MCHC: 33.3 g/dL (ref 31.5–35.7)
MCV: 91 fL (ref 79–97)
Monocytes Absolute: 0.4 10*3/uL (ref 0.1–0.9)
Monocytes: 6 %
NEUTROS PCT: 68 %
Neutrophils Absolute: 4.4 10*3/uL (ref 1.4–7.0)
PLATELETS: 291 10*3/uL (ref 150–450)
RBC: 4.72 x10E6/uL (ref 3.77–5.28)
RDW: 13.7 % (ref 12.3–15.4)
WBC: 6.5 10*3/uL (ref 3.4–10.8)

## 2017-11-02 LAB — BASIC METABOLIC PANEL
BUN/Creatinine Ratio: 15 (ref 9–23)
BUN: 20 mg/dL (ref 6–24)
CALCIUM: 9.6 mg/dL (ref 8.7–10.2)
CHLORIDE: 102 mmol/L (ref 96–106)
CO2: 22 mmol/L (ref 20–29)
Creatinine, Ser: 1.32 mg/dL — ABNORMAL HIGH (ref 0.57–1.00)
GFR calc Af Amer: 55 mL/min/{1.73_m2} — ABNORMAL LOW (ref >59)
GFR calc non Af Amer: 48 mL/min/{1.73_m2} — ABNORMAL LOW (ref >59)
GLUCOSE: 93 mg/dL (ref 65–99)
Potassium: 4.5 mmol/L (ref 3.5–5.2)
Sodium: 140 mmol/L (ref 134–144)

## 2017-11-02 LAB — TSH: TSH: 1.99 u[IU]/mL (ref 0.450–4.500)

## 2017-11-15 ENCOUNTER — Telehealth: Payer: Self-pay

## 2017-11-15 DIAGNOSIS — I10 Essential (primary) hypertension: Secondary | ICD-10-CM

## 2017-11-15 MED ORDER — TRIAMTERENE-HCTZ 37.5-25 MG PO TABS: 1.0000 | ORAL_TABLET | Freq: Every day | ORAL | 5 refills | Status: DC

## 2017-11-15 NOTE — Telephone Encounter (Signed)
This has been sent

## 2017-11-22 ENCOUNTER — Ambulatory Visit (INDEPENDENT_AMBULATORY_CARE_PROVIDER_SITE_OTHER): Payer: Self-pay | Admitting: Family Medicine

## 2017-11-22 ENCOUNTER — Encounter: Payer: Self-pay | Admitting: Family Medicine

## 2017-11-22 VITALS — BP 141/82 | HR 71 | Temp 99.0°F | Resp 16 | Ht 67.0 in | Wt 261.0 lb

## 2017-11-22 DIAGNOSIS — M25541 Pain in joints of right hand: Secondary | ICD-10-CM

## 2017-11-22 DIAGNOSIS — R252 Cramp and spasm: Secondary | ICD-10-CM

## 2017-11-22 DIAGNOSIS — M25542 Pain in joints of left hand: Secondary | ICD-10-CM

## 2017-11-22 MED ORDER — MELOXICAM 7.5 MG PO TABS: 7.5000 mg | ORAL_TABLET | Freq: Every day | ORAL | 0 refills | Status: DC

## 2017-11-22 NOTE — Progress Notes (Signed)
Subjective:    Patient ID: Abigail Beasley, female    DOB: Oct 05, 1970, 47 y.o.   MRN: [id number]  HPI Abigail Beasley, a 47 year old female with a history of hypertension and obesity presents complaining of periodic aching, numbness and tingling to hands bilaterally.  Symptoms of been occurring over the last several months.  She has not identified any palliative or provocative factors relating to current symptoms. Symptoms are currently of moderate and intermittent severity. Symptoms occur intermittently and last an unspecified amount of time.  The patient denies headache, dizziness, recent falls, abnormal gait, or infection.  Symptoms are symmetric. [name] is right hand dominant.  Patient has not been evaluated for this condition in the past. Patient was started on a trial of gabapentin 300 mg at bedtime, without sustained relief.    Past Medical History:  Diagnosis Date  . Allergy   . Hypertension   . PONV (postoperative nausea and vomiting)    Social History   Socioeconomic History  . Marital status: Single    Spouse name: Not on file  . Number of children: Not on file  . Years of education: Not on file  . Highest education level: Not on file  Occupational History  . Not on file  Social Needs  . Financial resource strain: Not on file  . Food insecurity:    Worry: Not on file    Inability: Not on file  . Transportation needs:    Medical: Not on file    Non-medical: Not on file  Tobacco Use  . Smoking status: Never Smoker  . Smokeless tobacco: Never Used  Substance and Sexual Activity  . Alcohol use: Yes    Alcohol/week: 1.8 oz    Types: 3 Standard drinks or equivalent per week  . Drug use: Yes    Types: Marijuana    Comment: occ  . Sexual activity: Not Currently    Partners: Male    Birth control/protection: Surgical  Lifestyle  . Physical activity:    Days per week: Not on file    Minutes per session: Not on file  . Stress: Not on file  Relationships  . Social  connections:    Talks on phone: Not on file    Gets together: Not on file    Attends religious service: Not on file    Active member of club or organization: Not on file    Attends meetings of clubs or organizations: Not on file    Relationship status: Not on file  . Intimate partner violence:    Fear of current or ex partner: Not on file    Emotionally abused: Not on file    Physically abused: Not on file    Forced sexual activity: Not on file  Other Topics Concern  . Not on file  Social History Narrative   Marital status: single; not dating.      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      Tobacco: none      Alcohol:  Socially; rarely.      Drugs: none; marijuana in the past.      Exercise: none      History of incarceration for six years (guilt by association; friend robbed bank).      Sexual activity:  Males; last STD Gonorrhea at age 55.  Syphilis in twenties.    Immunization  History  Administered Date(s) Administered  . Hepatitis A, Adult 09/08/2014  . Hepatitis B, adult 09/08/2014  . Influenza Split 02/10/2015  . Influenza-Unspecified 02/27/2014  . Tdap 05/31/2011    Review of Systems  Constitutional: Negative.   HENT: Negative.   Eyes: Negative for photophobia and visual disturbance.  Respiratory: Negative.   Cardiovascular: Negative.   Gastrointestinal: Negative.   Endocrine: Negative.   Genitourinary: Negative.   Musculoskeletal: Negative.   Skin: Negative.   Neurological: Positive for numbness (bilateral hands).  Psychiatric/Behavioral: Negative.        Objective:   Physical Exam  Constitutional: She is oriented to person, place, and time. She appears well-developed and well-nourished.  HENT:  Head: Normocephalic.  Eyes: Pupils are equal, round, and reactive to light.  Neck: Normal range of motion.  Cardiovascular: Normal rate, regular rhythm, normal heart  sounds and intact distal pulses.  Pulmonary/Chest: Effort normal and breath sounds normal.  Abdominal: Soft.  Musculoskeletal:       Right wrist: She exhibits normal range of motion and no tenderness.       Left wrist: She exhibits normal range of motion and no tenderness.  Neurological: She is alert and oriented to person, place, and time.  Skin: Skin is warm and dry.  Psychiatric: She has a normal mood and affect. Her behavior is normal. Judgment and thought content normal.      BP (!) 141/82 (BP Location: Right Arm, Patient Position: Sitting, Cuff Size: Large)   Pulse 71   Temp 99 F (37.2 C) (Oral)   Resp 16   Ht 5\' 7"  (1.702 m)   Wt 261 lb (118.4 kg)   LMP 10/17/2016 (Exact Date)   SpO2 98%   BMI 40.88 kg/m  Assessment & Plan:  Arthralgia of both hands - Arthritis Panel - meloxicam (MOBIC) 7.5 MG tablet; Take 1 tablet (7.5 mg total) by mouth daily.  Dispense: 30 tablet; Refill: 0    Hand cramps - Arthritis Panel - meloxicam (MOBIC) 7.5 MG tablet; Take 1 tablet (7.5 mg total) by mouth daily.  Dispense: 30 tablet; Refill: 0   RTC: 3 months for hypertension  Will follow up by phone with any abnormal laboratory results  The patient was given clear instructions to go to ER or return to medical center if symptoms do not improve, worsen or new problems develop. The patient verbalized understanding. Will notify patient with laboratory results. Abigail Pounds  MSN, FNP-C [facility] [address] [place], [place] [address] [phone]

## 2017-11-22 NOTE — Patient Instructions (Addendum)
   Patient warrants a referral to orthopedic services or sports medicine, awaiting payer source.  We will follow-up by phone with any abnormal laboratory results.  We will also start a trial of meloxicam 7.5 mg daily with food.  Will discontinue gabapentin 300 mg at bedtime.  Problem worsens, please notify office to schedule first available appointment.    Joint Pain Joint pain can be caused by many things. The joint can be bruised, infected, weak from aging, or sore from exercise. The pain will probably go away if you follow your doctor's instructions for home care. If your joint pain continues, more tests may be needed to help find the cause of your condition. Follow these instructions at home: Watch your condition for any changes. Follow these instructions as told to lessen the pain that you are feeling:  Take medicines only as told by your doctor.  Rest the sore joint for as long as told by your doctor. If your doctor tells you to, raise (elevate) the painful joint above the level of your heart while you are sitting or lying down.  Do not do things that cause pain or make the pain worse.  If told, put ice on the painful area: ? Put ice in a plastic bag. ? Place a towel between your skin and the bag. ? Leave the ice on for 20 minutes, 2-3 times per day.  Wear an elastic bandage, splint, or sling as told by your doctor. Loosen the bandage or splint if your fingers or toes lose feeling (become numb) and tingle, or if they turn cold and blue.  Begin exercising or stretching the joint as told by your doctor. Ask your doctor what types of exercise are safe for you.  Keep all follow-up visits as told by your doctor. This is important.  Contact a doctor if:  Your pain gets worse and medicine does not help it.  Your joint pain does not get better in 3 days.  You have more bruising or swelling.  You have a fever.  You lose 10 pounds (4.5 kg) or more without trying. Get help right  away if:  You are not able to move the joint.  Your fingers or toes become numb or they turn cold and blue. This information is not intended to replace advice given to you by your health care provider. Make sure you discuss any questions you have with your health care provider. Document Released: 05/04/2009 Document Revised: 10/22/2015 Document Reviewed: 02/25/2014 Elsevier Interactive Patient Education  [employer].

## 2017-11-23 LAB — ARTHRITIS PANEL
Anti Nuclear Antibody(ANA): NEGATIVE
Rhuematoid fact SerPl-aCnc: <10 IU/mL (ref 0.0–13.9)
Sed Rate: 10 mm/hr (ref 0–32)
URIC ACID: 4.9 mg/dL (ref 2.5–7.1)

## 2017-12-28 ENCOUNTER — Other Ambulatory Visit: Payer: Self-pay | Admitting: Family Medicine

## 2017-12-28 DIAGNOSIS — M25542 Pain in joints of left hand: Secondary | ICD-10-CM

## 2017-12-28 DIAGNOSIS — R252 Cramp and spasm: Secondary | ICD-10-CM

## 2017-12-28 DIAGNOSIS — M25541 Pain in joints of right hand: Secondary | ICD-10-CM

## 2018-02-07 ENCOUNTER — Telehealth: Payer: Self-pay

## 2018-02-07 DIAGNOSIS — M25542 Pain in joints of left hand: Secondary | ICD-10-CM

## 2018-02-07 DIAGNOSIS — M25541 Pain in joints of right hand: Secondary | ICD-10-CM

## 2018-02-07 DIAGNOSIS — R252 Cramp and spasm: Secondary | ICD-10-CM

## 2018-02-07 MED ORDER — MELOXICAM 7.5 MG PO TABS: 7.5000 mg | ORAL_TABLET | Freq: Every day | ORAL | 0 refills | Status: DC

## 2018-02-07 NOTE — Telephone Encounter (Signed)
SENT INTO PHARMACY. THANKS!

## 2018-02-22 ENCOUNTER — Ambulatory Visit: Payer: Self-pay | Admitting: Family Medicine

## 2018-03-08 ENCOUNTER — Ambulatory Visit: Payer: Self-pay | Admitting: Family Medicine

## 2018-03-08 ENCOUNTER — Telehealth: Payer: Self-pay

## 2018-03-08 NOTE — Telephone Encounter (Signed)
Patient will make appointment tomorrow at 140pm.

## 2018-03-09 ENCOUNTER — Ambulatory Visit (HOSPITAL_COMMUNITY)
Admission: RE | Admit: 2018-03-09 | Discharge: 2018-03-09 | Disposition: A | Discharge status: Home / Self Care | Payer: Self-pay | Source: Ambulatory Visit | Attending: Family Medicine | Admitting: Family Medicine

## 2018-03-09 ENCOUNTER — Ambulatory Visit (INDEPENDENT_AMBULATORY_CARE_PROVIDER_SITE_OTHER): Payer: Self-pay | Admitting: Family Medicine

## 2018-03-09 VITALS — BP 141/80 | HR 70 | Temp 97.7°F | Resp 16 | Ht 67.0 in | Wt 267.0 lb

## 2018-03-09 DIAGNOSIS — I10 Essential (primary) hypertension: Secondary | ICD-10-CM

## 2018-03-09 DIAGNOSIS — R109 Unspecified abdominal pain: Secondary | ICD-10-CM | POA: Insufficient documentation

## 2018-03-09 DIAGNOSIS — M25542 Pain in joints of left hand: Secondary | ICD-10-CM

## 2018-03-09 DIAGNOSIS — M25541 Pain in joints of right hand: Secondary | ICD-10-CM

## 2018-03-09 DIAGNOSIS — R252 Cramp and spasm: Secondary | ICD-10-CM

## 2018-03-09 DIAGNOSIS — Z23 Encounter for immunization: Secondary | ICD-10-CM

## 2018-03-09 LAB — POCT URINALYSIS DIPSTICK
Bilirubin, UA: NEGATIVE
Blood, UA: NEGATIVE
Glucose, UA: NEGATIVE
Ketones, UA: NEGATIVE
Nitrite, UA: NEGATIVE
Protein, UA: NEGATIVE
Spec Grav, UA: 1.025 (ref 1.010–1.025)
Urobilinogen, UA: 0.2 E.U./dL
pH, UA: 6 (ref 5.0–8.0)

## 2018-03-09 MED ORDER — PHENAZOPYRIDINE HCL 200 MG PO TABS: 200.0000 mg | ORAL_TABLET | Freq: Three times a day (TID) | ORAL | 0 refills | Status: AC | PRN

## 2018-03-09 MED ORDER — SULFAMETHOXAZOLE-TRIMETHOPRIM 800-160 MG PO TABS: 1.0000 | ORAL_TABLET | Freq: Two times a day (BID) | ORAL | 0 refills | Status: AC

## 2018-03-09 MED ORDER — MELOXICAM 7.5 MG PO TABS: 7.5000 mg | ORAL_TABLET | Freq: Every day | ORAL | 0 refills | Status: DC

## 2018-03-09 NOTE — Patient Instructions (Signed)

## 2018-03-09 NOTE — Progress Notes (Signed)
  [facility]   Progress Note: General Provider: Lanae Boast, FNP  SUBJECTIVE:   Abigail Beasley is a 47 y.o. female who  has a past medical history of Allergy, Hypertension, and PONV (postoperative nausea and vomiting).. Patient presents today for Dysuria (x 1 week ) Patient presents with mild abdominal pain upon urination.  She denies fevers chills or night sweats.  She also denies vaginal discharge or irritation.  Patient states that she has had a hysterectomy and has ovaries left.  Patient describes the pain is dull aching with occasional shortness.  Occurs with urination.  Patient denies constipation diarrhea nausea vomiting Review of Systems  Gastrointestinal: Positive for abdominal pain. Negative for constipation, diarrhea, nausea and vomiting.  All other systems reviewed and are negative.    OBJECTIVE: BP (!) 141/80 (BP Location: Left Arm, Patient Position: Sitting, Cuff Size: Large)   Pulse 70   Temp 97.7 F (36.5 C) (Oral)   Resp 16   Ht 5\' 7"  (1.702 m)   Wt 267 lb (121.1 kg)   LMP 10/17/2016 (Exact Date)   SpO2 100%   BMI 41.82 kg/m   Physical Exam  Constitutional: She is oriented to person, place, and time. She appears well-developed and well-nourished. No distress.  HENT:  Head: Normocephalic and atraumatic.  Eyes: Pupils are equal, round, and reactive to light. Conjunctivae and EOM are normal.  Neck: Normal range of motion.  Cardiovascular: Normal rate, regular rhythm, normal heart sounds and intact distal pulses.  Pulmonary/Chest: Effort normal and breath sounds normal. No respiratory distress.  Abdominal: Soft. Bowel sounds are normal. She exhibits no distension.  Musculoskeletal: Normal range of motion.  Neurological: She is alert and oriented to person, place, and time.  Skin: Skin is warm and dry.  Psychiatric: She has a normal mood and affect. Her behavior is normal. Judgment and thought content normal.    Nursing note and vitals reviewed.   ASSESSMENT/PLAN:   1. Essential hypertension, benign The current medical regimen is effective;  continue present plan and medications.  - Urinalysis Dipstick - Urine Culture  2. Abdominal pain, unspecified abdominal location - DG Abd 1 View; Future - phenazopyridine (PYRIDIUM) 200 MG tablet; Take 1 tablet (200 mg total) by mouth 3 (three) times daily as needed for up to 2 days for pain.  Dispense: 6 tablet; Refill: 0 - sulfamethoxazole-trimethoprim (BACTRIM DS,SEPTRA DS) 800-160 MG tablet; Take 1 tablet by mouth 2 (two) times daily for 3 days.  Dispense: 6 tablet; Refill: 0  3. Hand cramps - meloxicam (MOBIC) 7.5 MG tablet; Take 1 tablet (7.5 mg total) by mouth daily.  Dispense: 30 tablet; Refill: 0  4. Arthralgia of both hands - meloxicam (MOBIC) 7.5 MG tablet; Take 1 tablet (7.5 mg total) by mouth daily.  Dispense: 30 tablet; Refill: 0         The patient was given clear instructions to go to ER or return to medical center if symptoms do not improve, worsen or new problems develop. The patient verbalized understanding and agreed with plan of care.   Ms. Doug Sou. Nathaneil Canary, FNP-BC [facility] [address] [place], [place] [address] [phone]     This note has been created with Dragon speech recognition software and smart phrase technology. Any transcriptional errors are unintentional.

## 2018-03-11 LAB — URINE CULTURE

## 2018-03-13 ENCOUNTER — Telehealth: Payer: Self-pay

## 2018-03-13 NOTE — Telephone Encounter (Signed)
-----   Message from Lanae Boast, [place] sent at 03/13/2018  6:49 AM EDT ----- Please inform patient that there was mild constipation noted on the x-ray of the abdomen.  There is no sign of bowel obstruction, kidney stones or other abnormalities at the present time.  Advised patient to take a mild laxative over-the-counter.  No signs of urinary tract infection.  If problems persist please return to care for further evaluation.  If patient develops fevers chills bleeding is for vaginal bleeding please go to the emergency department immediately

## 2018-03-13 NOTE — Telephone Encounter (Signed)
Called, no answer. Left a voicemail for patient to call back. Thanks!  

## 2018-03-13 NOTE — Telephone Encounter (Signed)
Patient is aware of results and agrees with plan.

## 2018-03-22 ENCOUNTER — Telehealth: Payer: Self-pay

## 2018-03-22 NOTE — Telephone Encounter (Signed)
Patient phone kept hanging up

## 2018-03-23 ENCOUNTER — Ambulatory Visit: Payer: Self-pay | Admitting: Family Medicine

## 2018-04-11 ENCOUNTER — Other Ambulatory Visit: Payer: Self-pay | Admitting: Family Medicine

## 2018-04-11 DIAGNOSIS — M25541 Pain in joints of right hand: Secondary | ICD-10-CM

## 2018-04-11 DIAGNOSIS — R252 Cramp and spasm: Secondary | ICD-10-CM

## 2018-04-11 DIAGNOSIS — M25542 Pain in joints of left hand: Secondary | ICD-10-CM

## 2018-04-12 ENCOUNTER — Telehealth: Payer: Self-pay

## 2018-04-12 DIAGNOSIS — R252 Cramp and spasm: Secondary | ICD-10-CM

## 2018-04-12 DIAGNOSIS — M25542 Pain in joints of left hand: Secondary | ICD-10-CM

## 2018-04-12 DIAGNOSIS — M25541 Pain in joints of right hand: Secondary | ICD-10-CM

## 2018-04-13 MED ORDER — MELOXICAM 7.5 MG PO TABS: 7.5000 mg | ORAL_TABLET | Freq: Every day | ORAL | 1 refills | Status: DC

## 2018-04-13 NOTE — Telephone Encounter (Signed)
Sent to pharm on file

## 2018-04-23 ENCOUNTER — Ambulatory Visit: Payer: Self-pay | Admitting: Family Medicine

## 2018-05-10 ENCOUNTER — Ambulatory Visit (INDEPENDENT_AMBULATORY_CARE_PROVIDER_SITE_OTHER): Payer: Self-pay | Admitting: Family Medicine

## 2018-05-10 ENCOUNTER — Encounter: Payer: Self-pay | Admitting: Family Medicine

## 2018-05-10 VITALS — BP 131/74 | HR 78 | Temp 98.4°F | Resp 16 | Ht 67.0 in | Wt 266.0 lb

## 2018-05-10 DIAGNOSIS — J0120 Acute ethmoidal sinusitis, unspecified: Secondary | ICD-10-CM

## 2018-05-10 DIAGNOSIS — R252 Cramp and spasm: Secondary | ICD-10-CM

## 2018-05-10 DIAGNOSIS — M25542 Pain in joints of left hand: Secondary | ICD-10-CM

## 2018-05-10 DIAGNOSIS — M25541 Pain in joints of right hand: Secondary | ICD-10-CM

## 2018-05-10 DIAGNOSIS — G629 Polyneuropathy, unspecified: Secondary | ICD-10-CM

## 2018-05-10 DIAGNOSIS — I10 Essential (primary) hypertension: Secondary | ICD-10-CM

## 2018-05-10 LAB — POCT URINALYSIS DIPSTICK
Bilirubin, UA: NEGATIVE
Blood, UA: NEGATIVE
Glucose, UA: NEGATIVE
Ketones, UA: NEGATIVE
Nitrite, UA: NEGATIVE
Protein, UA: POSITIVE — AB
Spec Grav, UA: 1.025 (ref 1.010–1.025)
Urobilinogen, UA: 1 E.U./dL
pH, UA: 5.5 (ref 5.0–8.0)

## 2018-05-10 MED ORDER — GABAPENTIN 300 MG PO CAPS: 300.0000 mg | ORAL_CAPSULE | Freq: Every day | ORAL | 5 refills | Status: DC

## 2018-05-10 MED ORDER — AMOXICILLIN 500 MG PO CAPS: 500.0000 mg | ORAL_CAPSULE | Freq: Three times a day (TID) | ORAL | 0 refills | Status: AC

## 2018-05-10 MED ORDER — TRIAMTERENE-HCTZ 37.5-25 MG PO TABS: 1.0000 | ORAL_TABLET | Freq: Every day | ORAL | 5 refills | Status: DC

## 2018-05-10 MED ORDER — METHYLPREDNISOLONE SODIUM SUCC 125 MG IJ SOLR
125.0000 mg | Freq: Once | INTRAMUSCULAR | Status: AC
  Administered 2018-05-10: 125 mg via INTRAMUSCULAR

## 2018-05-10 MED ORDER — MELOXICAM 15 MG PO TABS: 15.0000 mg | ORAL_TABLET | Freq: Every day | ORAL | 2 refills | Status: DC

## 2018-05-10 MED ORDER — PREDNISONE 20 MG PO TABS: 60.0000 mg | ORAL_TABLET | Freq: Every day | ORAL | 0 refills | Status: AC

## 2018-05-10 NOTE — Progress Notes (Signed)
[facility]   Progress Note: General Provider: Lanae Boast, FNP  SUBJECTIVE:   Abigail Beasley is a 47 y.o. female who  has a past medical history of Allergy, Hypertension, and PONV (postoperative nausea and vomiting).. Patient presents today for Hypertension; Hand Pain (cramping/ numbness ); and Headache (every other day )  Patient states that she has bilateral hand tingling and cramping daily. Reports symptoms being increased at night. 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  Patient states that she has been taking meloxicam with mild relief.  Patient with nasal congestion, headache and sinus pain x 1 month. Has not taken any medications for this. Denies hx of allergies.  Review of Systems  Constitutional: Negative.   HENT: Positive for congestion and sinus pain.   Eyes: Negative.   Respiratory: Negative.   Cardiovascular: Negative.   Gastrointestinal: Negative.   Genitourinary: Negative.   Musculoskeletal: Negative.   Skin: Negative.   Neurological: Positive for tingling (bilateral hands with cramping).  Psychiatric/Behavioral: Negative.      OBJECTIVE: BP 131/74 (BP Location: Left Arm, Patient Position: Sitting, Cuff Size: Large)   Pulse 78   Temp 98.4 F (36.9 C) (Oral)   Resp 16   Ht 5\' 7"  (1.702 m)   Wt 266 lb (120.7 kg)   LMP 10/17/2016 (Exact Date)   SpO2 100%   BMI 41.66 kg/m   Wt Readings from Last 3 Encounters:  05/10/18 266 lb (120.7 kg)  03/09/18 267 lb (121.1 kg)  11/22/17 261 lb (118.4 kg)     Physical Exam Vitals signs and nursing note reviewed.  Constitutional:      General: She is not in acute distress.    Appearance: She is well-developed.  HENT:     Head: Normocephalic and atraumatic.     Right Ear: A middle ear effusion is present.     Left Ear: A middle ear effusion is present.     Nose: Mucosal edema, congestion and rhinorrhea present.     Right Turbinates:  Swollen.     Left Turbinates: Swollen.     Right Sinus: Frontal sinus tenderness present.     Left Sinus: Frontal sinus tenderness present.  Eyes:     Conjunctiva/sclera: Conjunctivae normal.     Pupils: Pupils are equal, round, and reactive to light.  Neck:     Musculoskeletal: Normal range of motion.  Cardiovascular:     Rate and Rhythm: Normal rate and regular rhythm.     Heart sounds: Normal heart sounds.  Pulmonary:     Effort: Pulmonary effort is normal. No respiratory distress.     Breath sounds: Normal breath sounds.  Abdominal:     General: Bowel sounds are normal. There is no distension.     Palpations: Abdomen is soft.  Musculoskeletal: Normal range of motion.     Comments: Positive tinel and phalen signs. Bilateral hands  Skin:    General: Skin is warm and dry.  Neurological:     Mental Status: She is alert and oriented to person, place, and time.  Psychiatric:        Behavior: Behavior normal.        Thought Content: Thought content normal.     ASSESSMENT/PLAN:   1. Essential hypertension, benign Continue with current medications. The patient is asked to make an attempt to improve diet and exercise patterns to aid in medical management of this problem.  - Urinalysis Dipstick -  triamterene-hydrochlorothiazide (MAXZIDE-25) 37.5-25 MG tablet; Take 1 tablet by mouth daily.  Dispense: 30 tablet; Refill: 5  2. Neuropathy - gabapentin (NEURONTIN) 300 MG capsule; Take 1 capsule (300 mg total) by mouth at bedtime.  Dispense: 30 capsule; Refill: 5  3. Hand cramps Increased meloxicam to 15mg  QD.  - meloxicam (MOBIC) 15 MG tablet; Take 1 tablet (15 mg total) by mouth daily.  Dispense: 30 tablet; Refill: 2 Suspect carpal tunnel syndrome. Patient advised to try night splints and NSAIDs. Patient declined referral.  Uninsured and has not applied for orange card or cone financial assistance. Once approved, will refer to ortho for further evaluation.  4. Arthralgia of both  hands - meloxicam (MOBIC) 15 MG tablet; Take 1 tablet (15 mg total) by mouth daily.  Dispense: 30 tablet; Refill: 2  5. Acute non-recurrent ethmoidal sinusitis - amoxicillin (AMOXIL) 500 MG capsule; Take 1 capsule (500 mg total) by mouth 3 (three) times daily for 7 days.  Dispense: 21 capsule; Refill: 0 - predniSONE (DELTASONE) 20 MG tablet; Take 3 tablets (60 mg total) by mouth daily with breakfast for 5 days.  Dispense: 15 tablet; Refill: 0 - methylPREDNISolone sodium succinate (SOLU-MEDROL) 125 mg/2 mL injection 125 mg       The patient was given clear instructions to go to ER or return to medical center if symptoms do not improve, worsen or new problems develop. The patient verbalized understanding and agreed with plan of care.   Ms. Doug Sou. Nathaneil Canary, FNP-BC [facility] [address] [place], [place] [address] [phone]     This note has been created with Dragon speech recognition software and smart phrase technology. Any transcriptional errors are unintentional.

## 2018-05-10 NOTE — Patient Instructions (Signed)
Carpal Tunnel Syndrome Carpal tunnel syndrome is a condition that causes pain in your hand and arm. The carpal tunnel is a narrow area that is on the palm side of your wrist. Repeated wrist motion or certain diseases may cause swelling in the tunnel. This swelling can pinch the main nerve in the wrist (median nerve). Follow these instructions at home: If you have a splint:  Wear it as told by your doctor. Remove it only as told by your doctor.  Loosen the splint if your fingers: ? Become numb and tingle. ? Turn blue and cold.  Keep the splint clean and dry. General instructions  Take over-the-counter and prescription medicines only as told by your doctor.  Rest your wrist from any activity that may be causing your pain. If needed, talk to your employer about changes that can be made in your work, such as getting a wrist pad to use while typing.  If directed, apply ice to the painful area: ? Put ice in a plastic bag. ? Place a towel between your skin and the bag. ? Leave the ice on for 20 minutes, 2-3 times per day.  Keep all follow-up visits as told by your doctor. This is important.  Do any exercises as told by your doctor, physical therapist, or occupational therapist. Contact a doctor if:  You have new symptoms.  Medicine does not help your pain.  Your symptoms get worse. This information is not intended to replace advice given to you by your health care provider. Make sure you discuss any questions you have with your health care provider. Document Released: 05/05/2011 Document Revised: 10/22/2015 Document Reviewed: 10/01/2014 Elsevier Interactive Patient Education  2018 [employer]. Sinusitis, Adult Sinusitis is soreness and inflammation of your sinuses. Sinuses are hollow spaces in the bones around your face. They are located:  Around your eyes.  In the middle of your forehead.  Behind your nose.  In your cheekbones.  Your sinuses and nasal passages are lined  with a stringy fluid (mucus). Mucus normally drains out of your sinuses. When your nasal tissues get inflamed or swollen, the mucus can get trapped or blocked so air cannot flow through your sinuses. This lets bacteria, viruses, and funguses grow, and that leads to infection. Follow these instructions at home: Medicines  Take, use, or apply over-the-counter and prescription medicines only as told by your doctor. These may include nasal sprays.  If you were prescribed an antibiotic medicine, take it as told by your doctor. Do not stop taking the antibiotic even if you start to feel better. Hydrate and Humidify  Drink enough water to keep your pee (urine) clear or pale yellow.  Use a cool mist humidifier to keep the humidity level in your home above 50%.  Breathe in steam for 10-15 minutes, 3-4 times a day or as told by your doctor. You can do this in the bathroom while a hot shower is running.  Try not to spend time in cool or dry air. Rest  Rest as much as possible.  Sleep with your head raised (elevated).  Make sure to get enough sleep each night. General instructions  Put a warm, moist washcloth on your face 3-4 times a day or as told by your doctor. This will help with discomfort.  Wash your hands often with soap and water. If there is no soap and water, use hand sanitizer.  Do not smoke. Avoid being around people who are smoking (secondhand smoke).  Keep all follow-up  visits as told by your doctor. This is important. Contact a doctor if:  You have a fever.  Your symptoms get worse.  Your symptoms do not get better within 10 days. Get help right away if:  You have a very bad headache.  You cannot stop throwing up (vomiting).  You have pain or swelling around your face or eyes.  You have trouble seeing.  You feel confused.  Your neck is stiff.  You have trouble breathing. This information is not intended to replace advice given to you by your health care  provider. Make sure you discuss any questions you have with your health care provider. Document Released: 11/02/2007 Document Revised: 01/10/2016 Document Reviewed: 03/11/2015 Elsevier Interactive Patient Education  [employer].

## 2018-08-09 ENCOUNTER — Ambulatory Visit: Payer: Self-pay | Admitting: Family Medicine

## 2018-08-16 ENCOUNTER — Ambulatory Visit: Payer: Self-pay | Admitting: Family Medicine

## 2018-08-20 ENCOUNTER — Ambulatory Visit: Payer: Self-pay | Admitting: Family Medicine

## 2018-08-20 ENCOUNTER — Telehealth: Payer: Self-pay

## 2018-08-20 DIAGNOSIS — M25542 Pain in joints of left hand: Secondary | ICD-10-CM

## 2018-08-20 DIAGNOSIS — M25541 Pain in joints of right hand: Secondary | ICD-10-CM

## 2018-08-20 DIAGNOSIS — R252 Cramp and spasm: Secondary | ICD-10-CM

## 2018-08-20 MED ORDER — MELOXICAM 15 MG PO TABS: 15.0000 mg | ORAL_TABLET | Freq: Every day | ORAL | 0 refills | Status: DC

## 2018-08-20 NOTE — Telephone Encounter (Signed)
Pt is requesting a medication refill on Meloxicam. Thanks!   Routing comment

## 2018-08-20 NOTE — Telephone Encounter (Signed)
Sent in refill for Meloxicam. Patient will need to keep follow up for additional refills.

## 2018-08-29 ENCOUNTER — Other Ambulatory Visit: Payer: Self-pay

## 2018-08-29 ENCOUNTER — Encounter: Payer: Self-pay | Admitting: Family Medicine

## 2018-08-29 ENCOUNTER — Ambulatory Visit (INDEPENDENT_AMBULATORY_CARE_PROVIDER_SITE_OTHER): Payer: Self-pay | Admitting: Family Medicine

## 2018-08-29 VITALS — BP 150/76 | HR 72 | Temp 97.9°F | Ht 67.0 in | Wt 263.0 lb

## 2018-08-29 DIAGNOSIS — M25541 Pain in joints of right hand: Secondary | ICD-10-CM

## 2018-08-29 DIAGNOSIS — M25542 Pain in joints of left hand: Secondary | ICD-10-CM

## 2018-08-29 DIAGNOSIS — I10 Essential (primary) hypertension: Secondary | ICD-10-CM

## 2018-08-29 DIAGNOSIS — G629 Polyneuropathy, unspecified: Secondary | ICD-10-CM

## 2018-08-29 DIAGNOSIS — H538 Other visual disturbances: Secondary | ICD-10-CM

## 2018-08-29 DIAGNOSIS — R252 Cramp and spasm: Secondary | ICD-10-CM

## 2018-08-29 LAB — POCT URINALYSIS DIP (MANUAL ENTRY)
Bilirubin, UA: NEGATIVE
Blood, UA: NEGATIVE
Glucose, UA: NEGATIVE mg/dL
Ketones, POC UA: NEGATIVE mg/dL
Leukocytes, UA: NEGATIVE
Nitrite, UA: NEGATIVE
Protein Ur, POC: 30 mg/dL — AB
Spec Grav, UA: 1.025 (ref 1.010–1.025)
Urobilinogen, UA: 1 E.U./dL
pH, UA: 6 (ref 5.0–8.0)

## 2018-08-29 MED ORDER — MELOXICAM 15 MG PO TABS: 15.0000 mg | ORAL_TABLET | Freq: Every day | ORAL | 0 refills | Status: DC

## 2018-08-29 MED ORDER — GABAPENTIN 300 MG PO CAPS: 600.0000 mg | ORAL_CAPSULE | Freq: Every day | ORAL | 5 refills | Status: DC

## 2018-08-29 MED ORDER — PREDNISONE 10 MG (21) PO TBPK: ORAL_TABLET | ORAL | 0 refills | Status: DC

## 2018-08-29 MED ORDER — LOSARTAN POTASSIUM-HCTZ 50-12.5 MG PO TABS: 1.0000 | ORAL_TABLET | Freq: Every day | ORAL | 3 refills | Status: DC

## 2018-08-29 NOTE — Patient Instructions (Addendum)
Stop the amlodipine and start losartan-HCTZ every day. If you have any problems with this, please call the office and let us know. I increased your gabapentin to 600mg  at night. I sent a prednisone pack and refilled meloxicam.  Please remember to go to [facility] for an eye appointment.    Hydrochlorothiazide, HCTZ; Losartan tablets What is this medicine? LOSARTAN; HYDROCHLOROTHIAZIDE (loe SAR tan; hye droe klor oh THYE a zide) is a combination of a drug that relaxes blood vessels and a diuretic. It is used to treat high blood pressure. This medicine may also reduce the risk of stroke in certain patients. This medicine may be used for other purposes; ask your health care provider or pharmacist if you have questions. COMMON BRAND NAME(S): Hyzaar What should I tell my health care provider before I take this medicine? They need to know if you have any of these conditions: -decreased urine -kidney disease -liver disease -if you are on a special diet, like a low-salt diet -immune system problems, like lupus -an unusual or allergic reaction to losartan, hydrochlorothiazide, sulfa drugs, other medicines, foods, dyes, or preservatives -pregnant or trying to get pregnant -breast-feeding How should I use this medicine? Take this medicine by mouth with a glass of water. Follow the directions on the prescription label. You can take it with or without food. If it upsets your stomach, take it with food. Take your medicine at regular intervals. Do not take it more often than directed. Do not stop taking except on your doctor's advice. Talk to your pediatrician regarding the use of this medicine in children. Special care may be needed. Overdosage: If you think you have taken too much of this medicine contact a poison control center or emergency room at once. NOTE: This medicine is only for you. Do not share this medicine with others. What if I miss a dose? If you miss a dose, take it as soon as you can. If it  is almost time for your next dose, take only that dose. Do not take double or extra doses. What may interact with this medicine? -barbiturates, like phenobarbital -blood pressure medicines -celecoxib -cimetidine -corticosteroids -diabetic medicines -diuretics, especially triamterene, spironolactone or amiloride -fluconazole -lithium -NSAIDs, medicines for pain and inflammation, like ibuprofen or naproxen -potassium salts or potassium supplements -prescription pain medicines -rifampin -skeletal muscle relaxants like tubocurarine -some cholesterol-lowering medicines like cholestyramine or colestipol This list may not describe all possible interactions. Give your health care provider a list of all the medicines, herbs, non-prescription drugs, or dietary supplements you use. Also tell them if you smoke, drink alcohol, or use illegal drugs. Some items may interact with your medicine. What should I watch for while using this medicine? Check your blood pressure regularly while you are taking this medicine. Ask your doctor or health care professional what your blood pressure should be and when you should contact him or her. When you check your blood pressure, write down the measurements to show your doctor or health care professional. If you are taking this medicine for a long time, you must visit your health care professional for regular checks on your progress. Make sure you schedule appointments on a regular basis. You must not get dehydrated. Ask your doctor or health care professional how much fluid you need to drink a day. Check with him or her if you get an attack of severe diarrhea, nausea and vomiting, or if you sweat a lot. The loss of too much body fluid can make it dangerous  for you to take this medicine. Women should inform their doctor if they wish to become pregnant or think they might be pregnant. There is a potential for serious side effects to an unborn child, particularly in the  second or third trimester. Talk to your health care professional or pharmacist for more information. You may get drowsy or dizzy. Do not drive, use machinery, or do anything that needs mental alertness until you know how this drug affects you. Do not stand or sit up quickly, especially if you are an older patient. This reduces the risk of dizzy or fainting spells. Alcohol can make you more drowsy and dizzy. Avoid alcoholic drinks. This medicine may affect your blood sugar level. If you have diabetes, check with your doctor or health care professional before changing the dose of your diabetic medicine. Avoid salt substitutes unless you are told otherwise by your doctor or health care professional. Do not treat yourself for coughs, colds, or pain while you are taking this medicine without asking your doctor or health care professional for advice. Some ingredients may increase your blood pressure. What side effects may I notice from receiving this medicine? Side effects that you should report to your doctor or health care professional as soon as possible: -allergic reactions like skin rash, itching or hives, swelling of the face, lips, or tongue -breathing problems -changes in vision -dark urine -eye pain -fast or irregular heart beat, palpitations, or chest pain -feeling faint or lightheaded -muscle cramps -persistent dry cough -redness, blistering, peeling or loosening of the skin, including inside the mouth -stomach pain -trouble passing urine or change in the amount of urine -unusual bleeding or bruising -worsened gout pain -yellowing of the eyes or skin Side effects that usually do not require medical attention (report to your doctor or health care professional if they continue or are bothersome): -change in sex drive or performance -headache This list may not describe all possible side effects. Call your doctor for medical advice about side effects. You may report side effects to FDA at  1-800-FDA-1088. Where should I keep my medicine? Keep out of the reach of children. Store at room temperature between 15 and 30 degrees C (59 and 86 degrees F). Protect from light. Keep container tightly closed. Throw away any unused medicine after the expiration date. NOTE: This sheet is a summary. It may not cover all possible information. If you have questions about this medicine, talk to your doctor, pharmacist, or health care provider.  2019 Elsevier/Gold Standard (2010-02-03 13:57:32)

## 2018-08-29 NOTE — Progress Notes (Signed)
  [facility]   Progress Note: General Provider: Lanae Boast, FNP  SUBJECTIVE:   Abigail Beasley is a 48 y.o. female who  has a past medical history of Allergy, Hypertension, and PONV (postoperative nausea and vomiting).. Patient presents today for Follow-up (Numbness in right arm) and Blurred Vision  .   ROS   OBJECTIVE: BP (!) 150/76 (BP Location: Left Arm, Patient Position: Sitting, Cuff Size: Large)   Pulse 72   Temp 97.9 F (36.6 C) (Oral)   Ht 5\' 7"  (1.702 m)   Wt 263 lb (119.3 kg)   LMP 10/17/2016 (Exact Date)   SpO2 98%   BMI 41.19 kg/m   Wt Readings from Last 3 Encounters:  08/29/18 263 lb (119.3 kg)  05/10/18 266 lb (120.7 kg)  03/09/18 267 lb (121.1 kg)     Physical Exam  ASSESSMENT/PLAN:   1. Essential hypertension, benign Stopped triamtermine-hctz - POCT urinalysis dipstick - losartan-hydrochlorothiazide (HYZAAR) 50-12.5 MG tablet; Take 1 tablet by mouth daily.  Dispense: 90 tablet; Refill: 3  2. Hand cramps Patient instructed to apply for Eva discount. Reiterated the importance of doing so.  - meloxicam (MOBIC) 15 MG tablet; Take 1 tablet (15 mg total) by mouth daily.  Dispense: 30 tablet; Refill: 0  3. Arthralgia of both hands - meloxicam (MOBIC) 15 MG tablet; Take 1 tablet (15 mg total) by mouth daily.  Dispense: 30 tablet; Refill: 0  4. Neuropathy Increased neurontin to 600mg  QHS - gabapentin (NEURONTIN) 300 MG capsule; Take 2 capsules (600 mg total) by mouth at bedtime.  Dispense: 60 capsule; Refill: 5   5. Blurred vision Patient advised to have eye exam at local [place] for further evaluation.   Return in about 3 months (around 11/28/2018) for HTN- new medication.    The patient was given clear instructions to go to ER or return to medical center if symptoms do not improve, worsen or new problems develop. The patient verbalized understanding and agreed with plan of care.   Ms. Doug Sou.  Nathaneil Canary, FNP-BC [facility] [address] [place], [place] [address] [phone]

## 2018-09-05 ENCOUNTER — Telehealth: Payer: Self-pay

## 2018-09-05 NOTE — Telephone Encounter (Signed)
Patient states that the Losartan is making her dizzy and would like to go back to the triamterene hctz. Please advise

## 2018-09-06 NOTE — Telephone Encounter (Signed)
Okay to discontinue

## 2018-09-11 MED ORDER — TRIAMTERENE-HCTZ 37.5-25 MG PO TABS: 1.0000 | ORAL_TABLET | Freq: Every day | ORAL | 11 refills | Status: DC

## 2018-09-11 NOTE — Addendum Note (Signed)
Addended by: Genelle Bal on: 09/11/2018 02:16 PM   Modules accepted: Orders

## 2018-09-11 NOTE — Telephone Encounter (Signed)
Are you going to send in the Triamterene

## 2018-09-14 NOTE — Telephone Encounter (Signed)
error 

## 2018-10-29 ENCOUNTER — Other Ambulatory Visit: Payer: Self-pay | Admitting: Family Medicine

## 2018-10-29 DIAGNOSIS — M25541 Pain in joints of right hand: Secondary | ICD-10-CM

## 2018-10-29 DIAGNOSIS — M25542 Pain in joints of left hand: Secondary | ICD-10-CM

## 2018-10-29 DIAGNOSIS — R252 Cramp and spasm: Secondary | ICD-10-CM

## 2018-12-03 ENCOUNTER — Ambulatory Visit: Payer: Self-pay | Admitting: Family Medicine

## 2018-12-05 ENCOUNTER — Other Ambulatory Visit: Payer: Self-pay | Admitting: Family Medicine

## 2018-12-05 DIAGNOSIS — R252 Cramp and spasm: Secondary | ICD-10-CM

## 2018-12-05 DIAGNOSIS — M25541 Pain in joints of right hand: Secondary | ICD-10-CM

## 2018-12-06 ENCOUNTER — Telehealth: Payer: Self-pay

## 2018-12-06 MED ORDER — TRIAMTERENE-HCTZ 37.5-25 MG PO TABS: 1.0000 | ORAL_TABLET | Freq: Every day | ORAL | 11 refills | Status: DC

## 2018-12-06 NOTE — Telephone Encounter (Signed)
maxzide sent into pharmacy. Thanks!

## 2018-12-13 ENCOUNTER — Ambulatory Visit: Payer: Self-pay | Admitting: Family Medicine

## 2018-12-27 ENCOUNTER — Ambulatory Visit: Payer: Self-pay | Admitting: Family Medicine

## 2019-01-03 ENCOUNTER — Other Ambulatory Visit: Payer: Self-pay

## 2019-01-03 ENCOUNTER — Ambulatory Visit (INDEPENDENT_AMBULATORY_CARE_PROVIDER_SITE_OTHER): Payer: Self-pay | Admitting: Family Medicine

## 2019-01-03 ENCOUNTER — Encounter: Payer: Self-pay | Admitting: Family Medicine

## 2019-01-03 VITALS — BP 131/86 | HR 64 | Temp 98.4°F | Resp 16 | Ht 67.0 in | Wt 265.0 lb

## 2019-01-03 DIAGNOSIS — R8290 Unspecified abnormal findings in urine: Secondary | ICD-10-CM

## 2019-01-03 DIAGNOSIS — Z131 Encounter for screening for diabetes mellitus: Secondary | ICD-10-CM

## 2019-01-03 DIAGNOSIS — I10 Essential (primary) hypertension: Secondary | ICD-10-CM

## 2019-01-03 DIAGNOSIS — R252 Cramp and spasm: Secondary | ICD-10-CM

## 2019-01-03 DIAGNOSIS — M25542 Pain in joints of left hand: Secondary | ICD-10-CM

## 2019-01-03 DIAGNOSIS — G629 Polyneuropathy, unspecified: Secondary | ICD-10-CM

## 2019-01-03 DIAGNOSIS — M25541 Pain in joints of right hand: Secondary | ICD-10-CM

## 2019-01-03 LAB — POCT GLYCOSYLATED HEMOGLOBIN (HGB A1C): Hemoglobin A1C: 5.6 % (ref 4.0–5.6)

## 2019-01-03 LAB — POCT URINALYSIS DIPSTICK
Glucose, UA: NEGATIVE
Ketones, UA: NEGATIVE
Leukocytes, UA: NEGATIVE
Nitrite, UA: NEGATIVE
Protein, UA: POSITIVE — AB
Spec Grav, UA: >=1.03 — AB (ref 1.010–1.025)
Urobilinogen, UA: 1 E.U./dL
pH, UA: 5.5 (ref 5.0–8.0)

## 2019-01-03 MED ORDER — MELOXICAM 15 MG PO TABS: 15.0000 mg | ORAL_TABLET | Freq: Every day | ORAL | 0 refills | Status: DC

## 2019-01-03 MED ORDER — GABAPENTIN 300 MG PO CAPS: 600.0000 mg | ORAL_CAPSULE | Freq: Every day | ORAL | 5 refills | Status: DC

## 2019-01-03 NOTE — Patient Instructions (Signed)
Gabapentin capsules or tablets What is this medicine? GABAPENTIN (GA ba pen tin) is used to control seizures in certain types of epilepsy. It is also used to treat certain types of nerve pain. This medicine may be used for other purposes; ask your health care provider or pharmacist if you have questions. COMMON BRAND NAME(S): Active-PAC with Gabapentin, Gabarone, Neurontin What should I tell my health care provider before I take this medicine? They need to know if you have any of these conditions:  history of drug abuse or alcohol abuse problem  kidney disease  lung or breathing disease  suicidal thoughts, plans, or attempt; a previous suicide attempt by you or a family member  an unusual or allergic reaction to gabapentin, other medicines, foods, dyes, or preservatives  pregnant or trying to get pregnant  breast-feeding How should I use this medicine? Take this medicine by mouth with a glass of water. Follow the directions on the prescription label. You can take it with or without food. If it upsets your stomach, take it with food. Take your medicine at regular intervals. Do not take it more often than directed. Do not stop taking except on your doctor's advice. If you are directed to break the 600 or 800 mg tablets in half as part of your dose, the extra half tablet should be used for the next dose. If you have not used the extra half tablet within 28 days, it should be thrown away. A special MedGuide will be given to you by the pharmacist with each prescription and refill. Be sure to read this information carefully each time. Talk to your pediatrician regarding the use of this medicine in children. While this drug may be prescribed for children as young as 3 years for selected conditions, precautions do apply. Overdosage: If you think you have taken too much of this medicine contact a poison control center or emergency room at once. NOTE: This medicine is only for you. Do not share this  medicine with others. What if I miss a dose? If you miss a dose, take it as soon as you can. If it is almost time for your next dose, take only that dose. Do not take double or extra doses. What may interact with this medicine? This medicine may interact with the following medications:  alcohol  antihistamines for allergy, cough, and cold  certain medicines for anxiety or sleep  certain medicines for depression like amitriptyline, fluoxetine, sertraline  certain medicines for seizures like phenobarbital, primidone  certain medicines for stomach problems  general anesthetics like halothane, isoflurane, methoxyflurane, propofol  local anesthetics like lidocaine, pramoxine, tetracaine  medicines that relax muscles for surgery  narcotic medicines for pain  phenothiazines like chlorpromazine, mesoridazine, prochlorperazine, thioridazine This list may not describe all possible interactions. Give your health care provider a list of all the medicines, herbs, non-prescription drugs, or dietary supplements you use. Also tell them if you smoke, drink alcohol, or use illegal drugs. Some items may interact with your medicine. What should I watch for while using this medicine? Visit your doctor or health care provider for regular checks on your progress. You may want to keep a record at home of how you feel your condition is responding to treatment. You may want to share this information with your doctor or health care provider at each visit. You should contact your doctor or health care provider if your seizures get worse or if you have any new types of seizures. Do not stop taking   this medicine or any of your seizure medicines unless instructed by your doctor or health care provider. Stopping your medicine suddenly can increase your seizures or their severity. This medicine may cause serious skin reactions. They can happen weeks to months after starting the medicine. Contact your health care  provider right away if you notice fevers or flu-like symptoms with a rash. The rash may be red or purple and then turn into blisters or peeling of the skin. Or, you might notice a red rash with swelling of the face, lips or lymph nodes in your neck or under your arms. Wear a medical identification bracelet or chain if you are taking this medicine for seizures, and carry a card that lists all your medications. You may get drowsy, dizzy, or have blurred vision. Do not drive, use machinery, or do anything that needs mental alertness until you know how this medicine affects you. To reduce dizzy or fainting spells, do not sit or stand up quickly, especially if you are an older patient. Alcohol can increase drowsiness and dizziness. Avoid alcoholic drinks. Your mouth may get dry. Chewing sugarless gum or sucking hard candy, and drinking plenty of water will help. The use of this medicine may increase the chance of suicidal thoughts or actions. Pay special attention to how you are responding while on this medicine. Any worsening of mood, or thoughts of suicide or dying should be reported to your health care provider right away. Women who become pregnant while using this medicine may enroll in the [place] Pregnancy Registry by calling [phone]. This registry collects information about the safety of antiepileptic drug use during pregnancy. What side effects may I notice from receiving this medicine? Side effects that you should report to your doctor or health care professional as soon as possible:  allergic reactions like skin rash, itching or hives, swelling of the face, lips, or tongue  breathing problems  rash, fever, and swollen lymph nodes  redness, blistering, peeling or loosening of the skin, including inside the mouth  suicidal thoughts, mood changes Side effects that usually do not require medical attention (report to your doctor or health care professional if they  continue or are bothersome):  dizziness  drowsiness  headache  nausea, vomiting  swelling of ankles, feet, hands  tiredness This list may not describe all possible side effects. Call your doctor for medical advice about side effects. You may report side effects to FDA at 1-800-FDA-1088. Where should I keep my medicine? Keep out of reach of children. This medicine may cause accidental overdose and death if it taken by other adults, children, or pets. Mix any unused medicine with a substance like cat litter or coffee grounds. Then throw the medicine away in a sealed container like a sealed bag or a coffee can with a lid. Do not use the medicine after the expiration date. Store at room temperature between 15 and 30 degrees C (59 and 86 degrees F). NOTE: This sheet is a summary. It may not cover all possible information. If you have questions about this medicine, talk to your doctor, pharmacist, or health care provider.  2020 Elsevier/Gold Standard (2018-08-17 14:16:43) DASH Eating Plan DASH stands for "Dietary Approaches to Stop Hypertension." The DASH eating plan is a healthy eating plan that has been shown to reduce high blood pressure (hypertension). It may also reduce your risk for type 2 diabetes, heart disease, and stroke. The DASH eating plan may also help with weight loss. What are  tips for following this plan?  General guidelines  Avoid eating more than 2,300 mg (milligrams) of salt (sodium) a day. If you have hypertension, you may need to reduce your sodium intake to 1,500 mg a day.  Limit alcohol intake to no more than 1 drink a day for nonpregnant women and 2 drinks a day for men. One drink equals 12 oz of beer, 5 oz of wine, or 1 oz of hard liquor.  Work with your health care provider to maintain a healthy body weight or to lose weight. Ask what an ideal weight is for you.  Get at least 30 minutes of exercise that causes your heart to beat faster (aerobic exercise) most  days of the week. Activities may include walking, swimming, or biking.  Work with your health care provider or diet and nutrition specialist (dietitian) to adjust your eating plan to your individual calorie needs. Reading food labels   Check food labels for the amount of sodium per serving. Choose foods with less than 5 percent of the Daily Value of sodium. Generally, foods with less than 300 mg of sodium per serving fit into this eating plan.  To find whole grains, look for the word "whole" as the first word in the ingredient list. Shopping  Buy products labeled as "low-sodium" or "no salt added."  Buy fresh foods. Avoid canned foods and premade or frozen meals. Cooking  Avoid adding salt when cooking. Use salt-free seasonings or herbs instead of table salt or sea salt. Check with your health care provider or pharmacist before using salt substitutes.  Do not fry foods. Cook foods using healthy methods such as baking, boiling, grilling, and broiling instead.  Cook with heart-healthy oils, such as olive, canola, soybean, or sunflower oil. Meal planning  Eat a balanced diet that includes: ? 5 or more servings of fruits and vegetables each day. At each meal, try to fill half of your plate with fruits and vegetables. ? Up to 6-8 servings of whole grains each day. ? Less than 6 oz of lean meat, poultry, or fish each day. A 3-oz serving of meat is about the same size as a deck of cards. One egg equals 1 oz. ? 2 servings of low-fat dairy each day. ? A serving of nuts, seeds, or beans 5 times each week. ? Heart-healthy fats. Healthy fats called Omega-3 fatty acids are found in foods such as flaxseeds and coldwater fish, like sardines, salmon, and mackerel.  Limit how much you eat of the following: ? Canned or prepackaged foods. ? Food that is high in trans fat, such as fried foods. ? Food that is high in saturated fat, such as fatty meat. ? Sweets, desserts, sugary drinks, and other foods  with added sugar. ? Full-fat dairy products.  Do not salt foods before eating.  Try to eat at least 2 vegetarian meals each week.  Eat more home-cooked food and less restaurant, buffet, and fast food.  When eating at a restaurant, ask that your food be prepared with less salt or no salt, if possible. What foods are recommended? The items listed may not be a complete list. Talk with your dietitian about what dietary choices are best for you. Grains Whole-grain or whole-wheat bread. Whole-grain or whole-wheat pasta. Brown rice. [place]. Bulgur. Whole-grain and low-sodium cereals. Pita bread. Low-fat, low-sodium crackers. Whole-wheat flour tortillas. Vegetables Fresh or frozen vegetables (raw, steamed, roasted, or grilled). Low-sodium or reduced-sodium tomato and vegetable juice. Low-sodium or reduced-sodium tomato sauce  and tomato paste. Low-sodium or reduced-sodium canned vegetables. Fruits All fresh, dried, or frozen fruit. Canned fruit in natural juice (without added sugar). Meat and other protein foods Skinless chicken or [place]. Ground chicken or [place]. Pork with fat trimmed off. Fish and seafood. Egg whites. Dried beans, peas, or lentils. Unsalted nuts, nut butters, and seeds. Unsalted canned beans. Lean cuts of beef with fat trimmed off. Low-sodium, lean deli meat. Dairy Low-fat (1%) or fat-free (skim) milk. Fat-free, low-fat, or reduced-fat cheeses. Nonfat, low-sodium ricotta or cottage cheese. Low-fat or nonfat yogurt. Low-fat, low-sodium cheese. Fats and oils Soft margarine without trans fats. Vegetable oil. Low-fat, reduced-fat, or light mayonnaise and salad dressings (reduced-sodium). Canola, safflower, olive, soybean, and sunflower oils. Avocado. Seasoning and other foods Herbs. Spices. Seasoning mixes without salt. Unsalted popcorn and pretzels. Fat-free sweets. What foods are not recommended? The items listed may not be a complete list. Talk with your dietitian about  what dietary choices are best for you. Grains Baked goods made with fat, such as croissants, muffins, or some breads. Dry pasta or rice meal packs. Vegetables Creamed or fried vegetables. Vegetables in a cheese sauce. Regular canned vegetables (not low-sodium or reduced-sodium). Regular canned tomato sauce and paste (not low-sodium or reduced-sodium). Regular tomato and vegetable juice (not low-sodium or reduced-sodium). Angie Fava. Olives. Fruits Canned fruit in a light or heavy syrup. Fried fruit. Fruit in cream or butter sauce. Meat and other protein foods Fatty cuts of meat. Ribs. Fried meat. Berniece Salines. Sausage. Bologna and other processed lunch meats. Salami. Fatback. Hotdogs. Bratwurst. Salted nuts and seeds. Canned beans with added salt. Canned or smoked fish. Whole eggs or egg yolks. Chicken or [place] with skin. Dairy Whole or 2% milk, cream, and half-and-half. Whole or full-fat cream cheese. Whole-fat or sweetened yogurt. Full-fat cheese. Nondairy creamers. Whipped toppings. Processed cheese and cheese spreads. Fats and oils Butter. Stick margarine. Lard. Shortening. Ghee. Bacon fat. Tropical oils, such as coconut, palm kernel, or palm oil. Seasoning and other foods Salted popcorn and pretzels. Onion salt, garlic salt, seasoned salt, table salt, and sea salt. Worcestershire sauce. Tartar sauce. Barbecue sauce. Teriyaki sauce. Soy sauce, including reduced-sodium. Steak sauce. Canned and packaged gravies. Fish sauce. Oyster sauce. Cocktail sauce. Horseradish that you find on the shelf. Ketchup. Mustard. Meat flavorings and tenderizers. Bouillon cubes. Hot sauce and Tabasco sauce. Premade or packaged marinades. Premade or packaged taco seasonings. Relishes. Regular salad dressings. Where to find more information:  National Heart, Lung, and [place]: https://wilson-eaton.com/  American Heart Association: www.heart.org Summary  The DASH eating plan is a healthy eating plan that has been shown to  reduce high blood pressure (hypertension). It may also reduce your risk for type 2 diabetes, heart disease, and stroke.  With the DASH eating plan, you should limit salt (sodium) intake to 2,300 mg a day. If you have hypertension, you may need to reduce your sodium intake to 1,500 mg a day.  When on the DASH eating plan, aim to eat more fresh fruits and vegetables, whole grains, lean proteins, low-fat dairy, and heart-healthy fats.  Work with your health care provider or diet and nutrition specialist (dietitian) to adjust your eating plan to your individual calorie needs. This information is not intended to replace advice given to you by your health care provider. Make sure you discuss any questions you have with your health care provider. Document Released: 05/05/2011 Document Revised: 04/28/2017 Document Reviewed: 05/09/2016 Elsevier Patient Education  2020 [employer].

## 2019-01-03 NOTE — Progress Notes (Signed)
Patient [facility]   Progress Note: General Provider: Lanae Boast, FNP  SUBJECTIVE:   Abigail Beasley is a 48 y.o. female who  has a past medical history of Allergy, Hypertension, and PONV (postoperative nausea and vomiting).. Patient presents today for Hypertension, Foot Problem (left baby toe is numb ), and Arm Pain (numbness in bilateral arms ) Patient with neuropathy. Has not been taking gabapentin to aid with this due to not having medications. Patient states that the gabapentin worked well for her when she was taking consistently. She denies regular exercise, a low sodium or carb modified diet. Denies side effects of her medications.   Review of Systems  Constitutional: Negative.   HENT: Negative.   Eyes: Negative.   Respiratory: Negative.   Cardiovascular: Negative.   Gastrointestinal: Negative.   Genitourinary: Negative.   Musculoskeletal: Negative.   Skin: Negative.   Neurological: Positive for tingling.  Psychiatric/Behavioral: Negative.      OBJECTIVE: BP 131/86 (BP Location: Left Arm, Patient Position: Sitting, Cuff Size: Large)   Pulse 64   Temp 98.4 F (36.9 C) (Oral)   Resp 16   Ht 5\' 7"  (1.702 m)   Wt 265 lb (120.2 kg)   LMP 10/17/2016 (Exact Date)   SpO2 98%   BMI 41.50 kg/m   Wt Readings from Last 3 Encounters:  01/03/19 265 lb (120.2 kg)  08/29/18 263 lb (119.3 kg)  05/10/18 266 lb (120.7 kg)     Physical Exam Vitals signs and nursing note reviewed.  Constitutional:      General: She is not in acute distress.    Appearance: Normal appearance.  HENT:     Head: Normocephalic and atraumatic.  Eyes:     Extraocular Movements: Extraocular movements intact.     Conjunctiva/sclera: Conjunctivae normal.     Pupils: Pupils are equal, round, and reactive to light.  Cardiovascular:     Rate and Rhythm: Normal rate and regular rhythm.     Heart sounds: No murmur.  Pulmonary:     Effort: Pulmonary effort is  normal.     Breath sounds: Normal breath sounds.  Musculoskeletal: Normal range of motion.  Skin:    General: Skin is warm and dry.  Neurological:     Mental Status: She is alert and oriented to person, place, and time.  Psychiatric:        Mood and Affect: Mood normal.        Behavior: Behavior normal.        Thought Content: Thought content normal.        Judgment: Judgment normal.     ASSESSMENT/PLAN:   1. Essential hypertension, benign - Urinalysis Dipstick - Lipid Panel - Comprehensive metabolic panel - CBC with Differential  2. Screening for diabetes mellitus - HgB A1c  3. Hand cramps - meloxicam (MOBIC) 15 MG tablet; Take 1 tablet (15 mg total) by mouth daily.  Dispense: 30 tablet; Refill: 0  4. Arthralgia of both hands - meloxicam (MOBIC) 15 MG tablet; Take 1 tablet (15 mg total) by mouth daily.  Dispense: 30 tablet; Refill: 0  5. Neuropathy - gabapentin (NEURONTIN) 300 MG capsule; Take 2 capsules (600 mg total) by mouth at bedtime.  Dispense: 60 capsule; Refill: 5  6. Abnormal urinalysis - Urine Culture   Patient to restart gabapentin and take as prescribed. Discussed elevating feet while sitting and avoiding added table salt.   Return in about 3 months (around 04/05/2019).  The patient was given clear instructions to go to ER or return to medical center if symptoms do not improve, worsen or new problems develop. The patient verbalized understanding and agreed with plan of care.   Ms. Doug Sou. Nathaneil Canary, FNP-BC [facility] [address] [place], [place] [address] [phone]

## 2019-01-04 LAB — COMPREHENSIVE METABOLIC PANEL
ALT: 12 IU/L (ref 0–32)
AST: 12 IU/L (ref 0–40)
Albumin/Globulin Ratio: 1.5 (ref 1.2–2.2)
Albumin: 4.2 g/dL (ref 3.8–4.8)
Alkaline Phosphatase: 81 IU/L (ref 39–117)
BUN/Creatinine Ratio: 12 (ref 9–23)
BUN: 18 mg/dL (ref 6–24)
Bilirubin Total: 0.4 mg/dL (ref 0.0–1.2)
CO2: 18 mmol/L — ABNORMAL LOW (ref 20–29)
Calcium: 9.6 mg/dL (ref 8.7–10.2)
Chloride: 104 mmol/L (ref 96–106)
Creatinine, Ser: 1.45 mg/dL — ABNORMAL HIGH (ref 0.57–1.00)
GFR calc Af Amer: 49 mL/min/{1.73_m2} — ABNORMAL LOW (ref >59)
GFR calc non Af Amer: 42 mL/min/{1.73_m2} — ABNORMAL LOW (ref >59)
Globulin, Total: 2.8 g/dL (ref 1.5–4.5)
Glucose: 92 mg/dL (ref 65–99)
Potassium: 4.2 mmol/L (ref 3.5–5.2)
Sodium: 138 mmol/L (ref 134–144)
Total Protein: 7 g/dL (ref 6.0–8.5)

## 2019-01-04 LAB — CBC WITH DIFFERENTIAL/PLATELET
Basophils Absolute: 0.1 10*3/uL (ref 0.0–0.2)
Basos: 1 %
EOS (ABSOLUTE): 0.2 10*3/uL (ref 0.0–0.4)
Eos: 3 %
Hematocrit: 42.9 % (ref 34.0–46.6)
Hemoglobin: 14.3 g/dL (ref 11.1–15.9)
Immature Grans (Abs): 0 10*3/uL (ref 0.0–0.1)
Immature Granulocytes: 0 %
Lymphocytes Absolute: 1.2 10*3/uL (ref 0.7–3.1)
Lymphs: 20 %
MCH: 30.1 pg (ref 26.6–33.0)
MCHC: 33.3 g/dL (ref 31.5–35.7)
MCV: 90 fL (ref 79–97)
Monocytes Absolute: 0.5 10*3/uL (ref 0.1–0.9)
Monocytes: 8 %
Neutrophils Absolute: 4.1 10*3/uL (ref 1.4–7.0)
Neutrophils: 68 %
Platelets: 288 10*3/uL (ref 150–450)
RBC: 4.75 x10E6/uL (ref 3.77–5.28)
RDW: 12.1 % (ref 11.7–15.4)
WBC: 6.1 10*3/uL (ref 3.4–10.8)

## 2019-01-04 LAB — LIPID PANEL
Chol/HDL Ratio: 3.2 ratio (ref 0.0–4.4)
Cholesterol, Total: 189 mg/dL (ref 100–199)
HDL: 59 mg/dL (ref >39)
LDL Calculated: 109 mg/dL — ABNORMAL HIGH (ref 0–99)
Triglycerides: 104 mg/dL (ref 0–149)
VLDL Cholesterol Cal: 21 mg/dL (ref 5–40)

## 2019-01-05 LAB — URINE CULTURE

## 2019-01-07 ENCOUNTER — Other Ambulatory Visit: Payer: Self-pay

## 2019-01-07 ENCOUNTER — Telehealth: Payer: Self-pay

## 2019-01-07 ENCOUNTER — Other Ambulatory Visit: Payer: Self-pay | Admitting: Family Medicine

## 2019-01-07 MED ORDER — LISINOPRIL-HYDROCHLOROTHIAZIDE 20-25 MG PO TABS: 1.0000 | ORAL_TABLET | Freq: Every day | ORAL | 1 refills | Status: DC

## 2019-01-07 MED FILL — LISINOPRIL-HCTZ 20-25 MG TA: 20-25 | 30 days supply | Qty: 30 | Fill #0

## 2019-01-07 NOTE — Telephone Encounter (Signed)
Called and spoke with patient advised that there is a decrease in kidney function. Advised to stopped Meloxicam and start lisinopril-hctz as directed. Asked that she come back in 6 week for follow up labs and she was transferred to front desk to set up an appointment. Thanks!

## 2019-01-07 NOTE — Progress Notes (Signed)
Patient with decrease in kidney function. I would like for her to stop the meloxicam and start lisinopril-hctz. [facility] to [facility]. I would like to repeat her labs in 6 weeks.

## 2019-01-07 NOTE — Telephone Encounter (Signed)
-----   Message from Lanae Boast, [place] sent at 01/07/2019  9:56 AM EDT ----- Patient with decrease in kidney function. I would like for her to stop the meloxicam and start lisinopril-hctz. I sent this to [facility]. I would like to repeat her labs in 6 weeks.

## 2019-01-17 ENCOUNTER — Other Ambulatory Visit: Payer: Self-pay

## 2019-01-17 ENCOUNTER — Ambulatory Visit (INDEPENDENT_AMBULATORY_CARE_PROVIDER_SITE_OTHER): Payer: Self-pay | Admitting: Family Medicine

## 2019-01-17 VITALS — BP 145/81 | HR 67 | Temp 98.9°F | Resp 16 | Ht 67.0 in | Wt 268.0 lb

## 2019-01-17 DIAGNOSIS — I10 Essential (primary) hypertension: Secondary | ICD-10-CM

## 2019-01-17 LAB — POCT URINALYSIS DIPSTICK
Bilirubin, UA: NEGATIVE
Blood, UA: NEGATIVE
Glucose, UA: NEGATIVE
Ketones, UA: NEGATIVE
Leukocytes, UA: NEGATIVE
Nitrite, UA: NEGATIVE
Protein, UA: NEGATIVE
Spec Grav, UA: 1.02 (ref 1.010–1.025)
Urobilinogen, UA: 1 E.U./dL
pH, UA: 6.5 (ref 5.0–8.0)

## 2019-01-17 MED ORDER — AMLODIPINE BESYLATE 10 MG PO TABS: 10.0000 mg | ORAL_TABLET | Freq: Every day | ORAL | 3 refills | Status: DC

## 2019-01-17 MED ORDER — LOSARTAN POTASSIUM 50 MG PO TABS: 50.0000 mg | ORAL_TABLET | Freq: Every day | ORAL | 3 refills | Status: DC

## 2019-01-17 NOTE — Patient Instructions (Signed)
Losartan tablets What is this medicine? LOSARTAN (loe SAR tan) is used to treat high blood pressure and to reduce the risk of stroke in certain patients. This drug also slows the progression of kidney disease in patients with diabetes. This medicine may be used for other purposes; ask your health care provider or pharmacist if you have questions. COMMON BRAND NAME(S): Cozaar What should I tell my health care provider before I take this medicine? They need to know if you have any of these conditions:  heart failure  kidney or liver disease  an unusual or allergic reaction to losartan, other medicines, foods, dyes, or preservatives  pregnant or trying to get pregnant  breast-feeding How should I use this medicine? Take this medicine by mouth with a glass of water. Follow the directions on the prescription label. This medicine can be taken with or without food. Take your doses at regular intervals. Do not take your medicine more often than directed. Talk to your pediatrician regarding the use of this medicine in children. Special care may be needed. Overdosage: If you think you have taken too much of this medicine contact a poison control center or emergency room at once. NOTE: This medicine is only for you. Do not share this medicine with others. What if I miss a dose? If you miss a dose, take it as soon as you can. If it is almost time for your next dose, take only that dose. Do not take double or extra doses. What may interact with this medicine?  blood pressure medicines  diuretics, especially triamterene, spironolactone, or amiloride  fluconazole  NSAIDs, medicines for pain and inflammation, like ibuprofen or naproxen  potassium salts or potassium supplements  rifampin This list may not describe all possible interactions. Give your health care provider a list of all the medicines, herbs, non-prescription drugs, or dietary supplements you use. Also tell them if you smoke, drink  alcohol, or use illegal drugs. Some items may interact with your medicine. What should I watch for while using this medicine? Visit your doctor or health care professional for regular checks on your progress. Check your blood pressure as directed. Ask your doctor or health care professional what your blood pressure should be and when you should contact him or her. Call your doctor or health care professional if you notice an irregular or fast heart beat. Women should inform their doctor if they wish to become pregnant or think they might be pregnant. There is a potential for serious side effects to an unborn child, particularly in the second or third trimester. Talk to your health care professional or pharmacist for more information. You may get drowsy or dizzy. Do not drive, use machinery, or do anything that needs mental alertness until you know how this drug affects you. Do not stand or sit up quickly, especially if you are an older patient. This reduces the risk of dizzy or fainting spells. Alcohol can make you more drowsy and dizzy. Avoid alcoholic drinks. Avoid salt substitutes unless you are told otherwise by your doctor or health care professional. Do not treat yourself for coughs, colds, or pain while you are taking this medicine without asking your doctor or health care professional for advice. Some ingredients may increase your blood pressure. What side effects may I notice from receiving this medicine? Side effects that you should report to your doctor or health care professional as soon as possible:  confusion, dizziness, light headedness or fainting spells  decreased amount of urine  passed  difficulty breathing or swallowing, hoarseness, or tightening of the throat  fast or irregular heart beat, palpitations, or chest pain  skin rash, itching  swelling of your face, lips, tongue, hands, or feet Side effects that usually do not require medical attention (report to your doctor or  health care professional if they continue or are bothersome):  cough  decreased sexual function or desire  headache  nasal congestion or stuffiness  nausea or stomach pain  sore or cramping muscles This list may not describe all possible side effects. Call your doctor for medical advice about side effects. You may report side effects to FDA at 1-800-FDA-1088. Where should I keep my medicine? Keep out of the reach of children. Store at room temperature between 15 and 30 degrees C (59 and 86 degrees F). Protect from light. Keep container tightly closed. Throw away any unused medicine after the expiration date. NOTE: This sheet is a summary. It may not cover all possible information. If you have questions about this medicine, talk to your doctor, pharmacist, or health care provider.  2020 Elsevier/Gold Standard (2007-07-27 16:42:18)

## 2019-01-17 NOTE — Progress Notes (Signed)
  Patient [facility]   Progress Note: Sick Visit Provider: Lanae Boast, FNP  SUBJECTIVE:   Abigail Beasley is a 48 y.o. female who  has a past medical history of Allergy, Hypertension, and PONV (postoperative nausea and vomiting).. Patient presents today for Back Pain (left side back pain that started 2 days ago. (asking for a note for work) ), Leg Swelling (left leg swelling. ), and Medication Problem (patient states Lisinopril is making her cough ) Patient presents for follow up on medications. She states that she is having a dry cough that recently began. At the last visit, she was started on lisinopril hctz combination.  Review of Systems  Constitutional: Negative.   HENT: Negative.   Eyes: Negative.   Respiratory: Positive for cough.   Cardiovascular: Negative.   Gastrointestinal: Negative.   Genitourinary: Negative.   Musculoskeletal: Negative.   Skin: Negative.   Neurological: Negative.   Psychiatric/Behavioral: Negative.      OBJECTIVE: BP (!) 145/81 (BP Location: Left Arm, Patient Position: Sitting, Cuff Size: Large)   Pulse 67   Temp 98.9 F (37.2 C) (Oral)   Resp 16   Ht 5\' 7"  (1.702 m)   Wt 268 lb (121.6 kg)   LMP 10/17/2016 (Exact Date)   SpO2 98%   BMI 41.97 kg/m   Wt Readings from Last 3 Encounters:  01/17/19 268 lb (121.6 kg)  01/03/19 265 lb (120.2 kg)  08/29/18 263 lb (119.3 kg)     Physical Exam Vitals signs and nursing note reviewed.  Constitutional:      General: She is not in acute distress.    Appearance: Normal appearance.  HENT:     Head: Normocephalic and atraumatic.  Eyes:     Extraocular Movements: Extraocular movements intact.     Conjunctiva/sclera: Conjunctivae normal.     Pupils: Pupils are equal, round, and reactive to light.  Cardiovascular:     Rate and Rhythm: Normal rate and regular rhythm.     Heart sounds: No murmur.  Pulmonary:     Effort: Pulmonary effort is normal.     Breath  sounds: Normal breath sounds.  Musculoskeletal: Normal range of motion.  Skin:    General: Skin is warm and dry.  Neurological:     Mental Status: She is alert and oriented to person, place, and time.  Psychiatric:        Mood and Affect: Mood normal.        Behavior: Behavior normal.        Thought Content: Thought content normal.        Judgment: Judgment normal.     ASSESSMENT/PLAN:  1. Essential hypertension, benign Discontinued lisinopril-hctz due to cough. Added to allergy list. Start on amlodipine 10 mg. Continue with maxide.  - Urinalysis Dipstick - amLODipine (NORVASC) 10 MG tablet; Take 1 tablet (10 mg total) by mouth daily.  Dispense: 90 tablet; Refill: 3         The patient was given clear instructions to go to ER or return to medical center if symptoms do not improve, worsen or new problems develop. The patient verbalized understanding and agreed with plan of care.   Ms. Doug Sou. Nathaneil Canary, FNP-BC [facility] [address] [place], [place] [address] [phone]     This note has been created with Dragon speech recognition software and smart phrase technology. Any transcriptional errors are unintentional.

## 2019-01-29 DIAGNOSIS — R7989 Other specified abnormal findings of blood chemistry: Secondary | ICD-10-CM

## 2019-01-29 DIAGNOSIS — R10A2 Flank pain, left side: Secondary | ICD-10-CM

## 2019-01-29 DIAGNOSIS — R109 Unspecified abdominal pain: Secondary | ICD-10-CM

## 2019-01-29 DIAGNOSIS — Z87448 Personal history of other diseases of urinary system: Secondary | ICD-10-CM

## 2019-01-29 HISTORY — DX: Flank pain, left side: R10.A2

## 2019-01-29 HISTORY — DX: Unspecified abdominal pain: R10.9

## 2019-01-29 HISTORY — DX: Personal history of other diseases of urinary system: Z87.448

## 2019-01-29 HISTORY — DX: Other specified abnormal findings of blood chemistry: R79.89

## 2019-02-06 ENCOUNTER — Encounter (HOSPITAL_COMMUNITY): Payer: Self-pay | Admitting: *Deleted

## 2019-02-06 ENCOUNTER — Encounter (HOSPITAL_COMMUNITY): Payer: Self-pay

## 2019-02-18 ENCOUNTER — Other Ambulatory Visit: Payer: Self-pay

## 2019-02-18 DIAGNOSIS — I10 Essential (primary) hypertension: Secondary | ICD-10-CM

## 2019-02-19 LAB — COMPREHENSIVE METABOLIC PANEL
ALT: 13 IU/L (ref 0–32)
AST: 16 IU/L (ref 0–40)
Albumin/Globulin Ratio: 1.4 (ref 1.2–2.2)
Albumin: 3.9 g/dL (ref 3.8–4.8)
Alkaline Phosphatase: 72 IU/L (ref 39–117)
BUN/Creatinine Ratio: 13 (ref 9–23)
BUN: 18 mg/dL (ref 6–24)
Bilirubin Total: 0.3 mg/dL (ref 0.0–1.2)
CO2: 21 mmol/L (ref 20–29)
Calcium: 9.2 mg/dL (ref 8.7–10.2)
Chloride: 107 mmol/L — ABNORMAL HIGH (ref 96–106)
Creatinine, Ser: 1.35 mg/dL — ABNORMAL HIGH (ref 0.57–1.00)
GFR calc Af Amer: 53 mL/min/{1.73_m2} — ABNORMAL LOW (ref >59)
GFR calc non Af Amer: 46 mL/min/{1.73_m2} — ABNORMAL LOW (ref >59)
Globulin, Total: 2.8 g/dL (ref 1.5–4.5)
Glucose: 103 mg/dL — ABNORMAL HIGH (ref 65–99)
Potassium: 4.1 mmol/L (ref 3.5–5.2)
Sodium: 139 mmol/L (ref 134–144)
Total Protein: 6.7 g/dL (ref 6.0–8.5)

## 2019-02-22 ENCOUNTER — Other Ambulatory Visit: Payer: Self-pay | Admitting: Family Medicine

## 2019-02-22 ENCOUNTER — Encounter: Payer: Self-pay | Admitting: Family Medicine

## 2019-02-22 DIAGNOSIS — Z87448 Personal history of other diseases of urinary system: Secondary | ICD-10-CM

## 2019-02-22 DIAGNOSIS — R109 Unspecified abdominal pain: Secondary | ICD-10-CM

## 2019-02-22 DIAGNOSIS — R748 Abnormal levels of other serum enzymes: Secondary | ICD-10-CM

## 2019-02-22 DIAGNOSIS — R10A2 Flank pain, left side: Secondary | ICD-10-CM

## 2019-02-22 NOTE — Progress Notes (Signed)
Patient scheduled for labs only on 02/25/2019 for Microalbumin and Urinalysis.

## 2019-02-25 ENCOUNTER — Other Ambulatory Visit (INDEPENDENT_AMBULATORY_CARE_PROVIDER_SITE_OTHER): Payer: Self-pay

## 2019-02-25 ENCOUNTER — Telehealth: Payer: Self-pay

## 2019-02-25 ENCOUNTER — Other Ambulatory Visit: Payer: Self-pay

## 2019-02-25 DIAGNOSIS — Z87448 Personal history of other diseases of urinary system: Secondary | ICD-10-CM

## 2019-02-25 DIAGNOSIS — R109 Unspecified abdominal pain: Secondary | ICD-10-CM

## 2019-02-25 DIAGNOSIS — R748 Abnormal levels of other serum enzymes: Secondary | ICD-10-CM

## 2019-02-25 LAB — POCT URINALYSIS DIPSTICK
Bilirubin, UA: NEGATIVE
Blood, UA: NEGATIVE
Glucose, UA: NEGATIVE
Ketones, UA: NEGATIVE
Nitrite, UA: NEGATIVE
Protein, UA: NEGATIVE
Spec Grav, UA: 1.02 (ref 1.010–1.025)
Urobilinogen, UA: 0.2 E.U./dL
pH, UA: 7 (ref 5.0–8.0)

## 2019-02-25 NOTE — Telephone Encounter (Signed)
Patient is requesting a call from Provider asking about decreased kidney function.

## 2019-02-25 NOTE — Telephone Encounter (Signed)
Patient informed. Thanks!

## 2019-02-26 LAB — MICROALBUMIN / CREATININE URINE RATIO
Creatinine, Urine: 97.6 mg/dL
Microalb/Creat Ratio: 33 mg/g creat — ABNORMAL HIGH (ref 0–29)
Microalbumin, Urine: 32 ug/mL

## 2019-04-10 ENCOUNTER — Ambulatory Visit: Payer: Self-pay | Admitting: Family Medicine

## 2019-05-09 ENCOUNTER — Other Ambulatory Visit: Payer: Self-pay

## 2019-05-09 ENCOUNTER — Encounter (HOSPITAL_COMMUNITY): Payer: Self-pay | Admitting: Emergency Medicine

## 2019-05-09 ENCOUNTER — Emergency Department (HOSPITAL_COMMUNITY)
Admission: EM | Admit: 2019-05-09 | Discharge: 2019-05-09 | Disposition: A | Discharge status: Home / Self Care | Payer: Self-pay | Attending: Emergency Medicine | Admitting: Emergency Medicine

## 2019-05-09 DIAGNOSIS — Z79899 Other long term (current) drug therapy: Secondary | ICD-10-CM | POA: Insufficient documentation

## 2019-05-09 DIAGNOSIS — I10 Essential (primary) hypertension: Secondary | ICD-10-CM | POA: Insufficient documentation

## 2019-05-09 DIAGNOSIS — U071 COVID-19: Secondary | ICD-10-CM | POA: Insufficient documentation

## 2019-05-09 LAB — POC SARS CORONAVIRUS 2 AG -  ED: SARS Coronavirus 2 Ag: POSITIVE — AB

## 2019-05-09 MED ORDER — ONDANSETRON HCL 4 MG PO TABS: 4.0000 mg | ORAL_TABLET | Freq: Four times a day (QID) | ORAL | 0 refills | Status: DC | PRN

## 2019-05-09 MED ORDER — ACETAMINOPHEN 325 MG PO TABS
650.0000 mg | ORAL_TABLET | Freq: Once | ORAL | Status: AC
  Administered 2019-05-09: 10:00:00 650 mg via ORAL
  Filled 2019-05-09: qty 2

## 2019-05-09 MED ORDER — DEXAMETHASONE 4 MG PO TABS
10.0000 mg | ORAL_TABLET | Freq: Once | ORAL | Status: AC
  Administered 2019-05-09: 10 mg via ORAL
  Filled 2019-05-09: qty 2

## 2019-05-09 MED ORDER — ONDANSETRON HCL 4 MG PO TABS
4.0000 mg | ORAL_TABLET | Freq: Once | ORAL | Status: AC
  Administered 2019-05-09: 10:00:00 4 mg via ORAL
  Filled 2019-05-09: qty 1

## 2019-05-09 NOTE — Discharge Instructions (Signed)
Thank you for allowing [place] to care for you today.   Please return to the emergency department if you have any new or worsening symptoms.  You tested positive for covid-19 today.   Medications- You can take medications to help treat your symptoms: -Tylenol for fever and body aches. Please take as prescribed on the bottle. -Over the coutner cough medicine such as mucinex, robitussin, or other brands. -Flonase for nasal congestion. -Vitminas as we discussed  Treatment- This is a virus and unfortunately there are no antibitotics approved to treat this virus at this time. It is important to monitor your symptoms closely: -You should have a theremometer at home to check your temperature when feeling feverish. -Use a pulse ox meter to measure your oxygen when feeling short of breath.  -If your fever is over 100.4 despite taking tylenol or if your oxygen level drops below 94% these are reasons to rturn to the emergency department for further evaluation. Please call the emergency department before you come to make [place] aware.    We recommend you self-isolate for 10 days and to inform your work/family/friends that you has the virus.  They will need to self-quarantine for 14 days to monitor for symptoms.    Again: symptoms of shortness of breath, chest pain, difficulty breathing, new onset of confuison, any symptoms that are concerning. And you or the person should come to emergency department for evaluation.   I hope you feel better soon

## 2019-05-09 NOTE — ED Triage Notes (Signed)
Patient arrives from home, C/C general fatigue and malaise since last Thursday. Has not been tested for COVID recently, does endorse cough, diarrhea, denies chest pain and SOB.

## 2019-05-09 NOTE — ED Notes (Signed)
Patient tolerating PO fluids at this time.

## 2019-05-09 NOTE — ED Notes (Signed)
When ambulating to bedside commode and back, oxygen saturation ranged from 94-98% RA.

## 2019-05-09 NOTE — ED Provider Notes (Signed)
[facility] DEPT Provider Note   CSN: [id number] Arrival date & time: 05/09/19  [id number]     History Chief Complaint  Patient presents with  . Fatigue    LOLITTA Beasley is a 48 y.o. female with past medical history significant for hypertension presents to emergency department today with chief complaint of generalized fatigue and malaise x 5 days. She is endorsing nonproductive cough. She has had 3 episodes of non bloody diarrhea today. Also reporting nausea without emesis. She has had subjective fever. She has tried taking Theraflu and alkaseltzer without symptom relief. Denies congestion, sore throat, chest pain, shortness of breath, difficulty breathing, abdominal pain, urinary symptoms, rash, neck pain.  Denies any sick contacts.  Denies any contact with anyone positive for COVID-19.  History provided by patient with additional history obtained from chart review.     Past Medical History:  Diagnosis Date  . Allergy   . Elevated serum creatinine 01/2019  . History of proteinuria syndrome 01/2019  . Hypertension   . Left flank pain 01/2019  . PONV (postoperative nausea and vomiting)     Patient Active Problem List   Diagnosis Date Noted  . Status post laparoscopic hysterectomy 11/08/2016  . Severe obesity (BMI >= 40) ([place]) 07/28/2014  . Dizziness and giddiness 03/18/2013  . Essential hypertension, benign 03/18/2013  . Breast pain, left 03/18/2013    Past Surgical History:  Procedure Laterality Date  . ABDOMINAL HYSTERECTOMY    . CYSTOSCOPY N/A 11/08/2016   Procedure: CYSTOSCOPY;  Surgeon: Salvadore Dom, MD;  Location: [facility];  Service: Gynecology;  Laterality: N/A;  . DILATION AND EVACUATION     miscarriage  . TOTAL LAPAROSCOPIC HYSTERECTOMY WITH SALPINGECTOMY Bilateral 11/08/2016   Procedure: HYSTERECTOMY TOTAL LAPAROSCOPIC WITH BILATERAL SALPINGECTOMY;  Surgeon: Salvadore Dom, MD;  Location: [facility];  Service: Gynecology;   Laterality: Bilateral;  . TUBAL LIGATION       OB History    Gravida  4   Para  2   Term  2   Preterm      AB  2   Living  2     SAB  1   TAB      Ectopic      Multiple      Live Births  2           Family History  Problem Relation Age of Onset  . Diabetes Mother   . Hypertension Mother   . Cancer Mother        liver   . Hyperlipidemia Mother   . Stroke Mother   . Diabetes Maternal Grandmother   . Hypertension Maternal Grandmother   . Hyperlipidemia Maternal Grandmother   . Heart disease Maternal Grandmother   . Hypertension Sister   . Cancer Maternal Grandfather        prostate  . Diabetes Maternal Grandfather   . Hyperlipidemia Maternal Grandfather   . Hypertension Maternal Grandfather   . Colon cancer Maternal Grandfather   . Diabetes Sister   . Diabetes Maternal Uncle     Social History   Tobacco Use  . Smoking status: Never Smoker  . Smokeless tobacco: Never Used  Substance Use Topics  . Alcohol use: Yes    Alcohol/week: 3.0 standard drinks    Types: 3 Standard drinks or equivalent per week  . Drug use: Yes    Types: Marijuana    Comment: occ    Home Medications Prior to Admission medications  Medication Sig Start Date End Date Taking? Authorizing Provider  amLODipine (NORVASC) 10 MG tablet Take 1 tablet (10 mg total) by mouth daily. 01/17/19   Lanae Boast, FNP  gabapentin (NEURONTIN) 300 MG capsule Take 2 capsules (600 mg total) by mouth at bedtime. 01/03/19   Lanae Boast, FNP  Multiple Vitamin (MULTIVITAMIN WITH MINERALS) TABS tablet Take 1 tablet by mouth 4 (four) times a week.     [provider]  ondansetron (ZOFRAN) 4 MG tablet Take 1 tablet (4 mg total) by mouth every 6 (six) hours as needed for nausea or vomiting. 05/09/19   Izick Gasbarro, Harley Hallmark, PA-C  triamterene-hydrochlorothiazide (MAXZIDE-25) 37.5-25 MG tablet Take 1 tablet by mouth daily. 12/06/18   Lanae Boast, FNP    Allergies    Lisinopril  Review of  Systems   Review of Systems All other systems are reviewed and are negative for acute change except as noted in the HPI.  Physical Exam Updated Vital Signs BP 123/78 (BP Location: Left Arm)   Pulse 66   Temp 98.2 F (36.8 C) (Oral)   Resp 16   Ht 5\' 7"  (1.702 m)   Wt 118.8 kg   LMP 10/17/2016 (Exact Date)   SpO2 99%   BMI 41.04 kg/m   Physical Exam Vitals and nursing note reviewed.  Constitutional:      General: She is not in acute distress.    Appearance: She is not ill-appearing.  HENT:     Head: Normocephalic and atraumatic.     Comments: No sinus or temporal tenderness.    Right Ear: Tympanic membrane and external ear normal.     Left Ear: Tympanic membrane and external ear normal.     Nose: Nose normal.     Mouth/Throat:     Mouth: Mucous membranes are moist.     Pharynx: Oropharynx is clear.     Comments: No erythema to oropharynx, no edema, no exudate, no tonsillar swelling, voice normal, neck supple without lymphadenopathy  Eyes:     General: No scleral icterus.       Right eye: No discharge.        Left eye: No discharge.     Extraocular Movements: Extraocular movements intact.     Conjunctiva/sclera: Conjunctivae normal.     Pupils: Pupils are equal, round, and reactive to light.  Neck:     Vascular: No JVD.  Cardiovascular:     Rate and Rhythm: Normal rate and regular rhythm.     Pulses: Normal pulses.          Radial pulses are 2+ on the right side and 2+ on the left side.     Heart sounds: Normal heart sounds.  Pulmonary:     Comments: Lungs clear to auscultation in all fields. Symmetric chest rise. No wheezing, rales, or rhonchi. Abdominal:     Comments: Abdomen is soft, non-distended, and non-tender in all quadrants. No rigidity, no guarding. No peritoneal signs.  Musculoskeletal:        General: Normal range of motion.     Cervical back: Normal range of motion.  Skin:    General: Skin is warm and dry.     Capillary Refill: Capillary refill  takes less than 2 seconds.  Neurological:     Mental Status: She is oriented to person, place, and time.     GCS: GCS eye subscore is 4. GCS verbal subscore is 5. GCS motor subscore is 6.     Comments: Fluent speech, no  facial droop.  Psychiatric:        Behavior: Behavior normal.       ED Results / Procedures / Treatments   Labs (all labs ordered are listed, but only abnormal results are displayed) Labs Reviewed  POC SARS CORONAVIRUS 2 AG -  ED - Abnormal; Notable for the following components:      Result Value   SARS Coronavirus 2 Ag POSITIVE (*)    All other components within normal limits    EKG None  Radiology No results found.  Procedures Procedures (including critical care time)  Medications Ordered in ED Medications  acetaminophen (TYLENOL) tablet 650 mg (650 mg Oral Given 05/09/19 0951)  ondansetron (ZOFRAN) tablet 4 mg (4 mg Oral Given 05/09/19 [id number])    ED Course  I have reviewed the triage vital signs and the nursing notes.  Pertinent labs & imaging results that were available during my care of the patient were reviewed by me and considered in my medical decision making (see chart for details).    MDM Rules/Calculators/A&P     CHA2DS2/VAS Stroke Risk Points      N/A >= 2 Points: High Risk  1 - 1.99 Points: Medium Risk  0 Points: Low Risk    A final score could not be computed because of missing components.: Last  Change: N/A   This score determines the patient's risk of having a stroke if the  patient has atrial fibrillation. This score is not applicable to this patient. Components are not  calculated.    I have reviewed patient's EMR to obtain pertinent PMH to assist in MDM.  Symptoms and exam most suggestive of uncomplicated viral illness. DDX incluldes viral URI/LRI, COVID-19.  No travel. No known exposures to confirmed COVID-19.    Exam is overall benign.  Normal WOB. No fever, tachypnea, tachycardia, hypoxemia. Lungs are CTAB. I do not think  that a CXR is indicated at this time as VS are WNL, there are no signs of consolidation on auscultation and there is no hypoxia, increased WOB or other concerning features to exam. No significant h/o immunocompromise. Doubt bacterial bronchitis or pneumonia.  No signs or symptoms to suggest strep pharyngitis.  No clinical signs of severe illness, dehydration, to warrant further emergent work up in ER. Rapid covid test is positive. She ambulated without respiratory distress or hypoxia. Nausea resolved after zofran.  She is tolerating PO intake while in the ED.  Serial abdominal exams are benign, doubt surgical abdomen.  Given reassuring physical exam, symptoms, will discharge with symptomatic treatment.  We discussed patient's overall low risk profile to develop complications. Self-isolation instructions discussed. Pt was given home self-isolation instructions and instructions for family members.  Pt understands signs and symptoms that would warrant return to ED. Discharged with prescription for zofran.  Pt comfortable and agreeable with POC.  Abigail Beasley was evaluated in Emergency Department on 05/09/2019 for the symptoms described in the history of present illness. She was evaluated in the context of the global COVID-19 pandemic, which necessitated consideration that the patient might be at risk for infection with the SARS-CoV-2 virus that causes COVID-19. Institutional protocols and algorithms that pertain to the evaluation of patients at risk for COVID-19 are in a state of rapid change based on information released by regulatory bodies including the CDC and federal and state organizations. These policies and algorithms were followed during the patient's care in the ED.   Portions of this note were generated with [employer].  Dictation errors may occur despite best attempts at proofreading.   Final Clinical Impression(s) / ED Diagnoses Final diagnoses:  COVID-19 virus infection    Rx  / DC Orders ED Discharge Orders         Ordered    ondansetron (ZOFRAN) 4 MG tablet  Every 6 hours PRN     05/09/19 1054           Maisha Bogen, Harley Hallmark, PA-C 05/09/19 Gutierrez, DO 05/09/19 1108

## 2019-05-16 ENCOUNTER — Other Ambulatory Visit: Payer: Self-pay | Admitting: Family Medicine

## 2019-05-16 ENCOUNTER — Telehealth: Payer: Self-pay

## 2019-05-16 DIAGNOSIS — B342 Coronavirus infection, unspecified: Secondary | ICD-10-CM

## 2019-05-16 DIAGNOSIS — R059 Cough, unspecified: Secondary | ICD-10-CM

## 2019-05-16 DIAGNOSIS — R05 Cough: Secondary | ICD-10-CM

## 2019-05-16 MED ORDER — HYDROCODONE-HOMATROPINE 5-1.5 MG/5ML PO SYRP: 5.0000 mL | ORAL_SOLUTION | Freq: Three times a day (TID) | ORAL | 0 refills | Status: DC | PRN

## 2019-05-16 MED ORDER — AMOXICILLIN-POT CLAVULANATE 875-125 MG PO TABS: 1.0000 | ORAL_TABLET | Freq: Two times a day (BID) | ORAL | 0 refills | Status: AC

## 2019-05-16 NOTE — Telephone Encounter (Signed)
Abigail Beasley,  I received a call from Herbie Baltimore, a Loss adjuster, chartered. He states they are unable to get Hycodan cough syrup at this time. He states yesterday [facility] on [address] had this and would suggest we send the rx to them. Can you please send in this rx, it can not be transferred due to being a controlled medication. Please advise.

## 2019-05-16 NOTE — Telephone Encounter (Signed)
Former Community education officer Patient. Follow up with [facility] in January 2020.  Patient was seem in the ED on 05/09/2019 Dx with COVID. Patient c/o chills, headache, cough. Taking OTC  Flu medicine.    Patient advised to monitor symptoms if worsen go back to the ED. Monitor BP and take Coricidin for symptoms increase fluids. Call  first thing in the morning for a "Tele-visit."   Please advise.

## 2019-05-20 ENCOUNTER — Ambulatory Visit: Payer: HRSA Program | Attending: Internal Medicine

## 2019-05-20 DIAGNOSIS — Z20828 Contact with and (suspected) exposure to other viral communicable diseases: Secondary | ICD-10-CM | POA: Diagnosis present

## 2019-05-20 DIAGNOSIS — Z20822 Contact with and (suspected) exposure to covid-19: Secondary | ICD-10-CM

## 2019-05-21 LAB — NOVEL CORONAVIRUS, NAA: SARS-CoV-2, NAA: NOT DETECTED

## 2019-05-29 ENCOUNTER — Other Ambulatory Visit: Payer: Self-pay

## 2019-05-29 ENCOUNTER — Telehealth: Payer: Self-pay | Admitting: Family Medicine

## 2019-05-29 ENCOUNTER — Ambulatory Visit: Payer: Self-pay | Admitting: Family Medicine

## 2019-05-29 NOTE — Telephone Encounter (Signed)
Multiple attempts to contact patient today for Telephone Virtual Visit.

## 2019-06-18 ENCOUNTER — Encounter: Payer: Self-pay | Admitting: Family Medicine

## 2019-06-18 ENCOUNTER — Ambulatory Visit (INDEPENDENT_AMBULATORY_CARE_PROVIDER_SITE_OTHER): Payer: Self-pay | Admitting: Family Medicine

## 2019-06-18 ENCOUNTER — Other Ambulatory Visit: Payer: Self-pay

## 2019-06-18 ENCOUNTER — Ambulatory Visit: Payer: Self-pay | Admitting: Family Medicine

## 2019-06-18 VITALS — BP 136/78 | HR 68 | Temp 98.6°F | Resp 16 | Ht 67.0 in | Wt 269.0 lb

## 2019-06-18 DIAGNOSIS — I10 Essential (primary) hypertension: Secondary | ICD-10-CM

## 2019-06-18 LAB — POCT URINALYSIS DIPSTICK
Bilirubin, UA: NEGATIVE
Blood, UA: NEGATIVE
Glucose, UA: NEGATIVE
Ketones, UA: NEGATIVE
Leukocytes, UA: NEGATIVE
Nitrite, UA: NEGATIVE
Protein, UA: NEGATIVE
Spec Grav, UA: 1.025 (ref 1.010–1.025)
Urobilinogen, UA: 0.2 E.U./dL
pH, UA: 5.5 (ref 5.0–8.0)
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
## 2019-06-19 ENCOUNTER — Telehealth: Payer: Self-pay

## 2019-06-19 LAB — BASIC METABOLIC PANEL
BUN/Creatinine Ratio: 13 (ref 9–23)
BUN: 17 mg/dL (ref 6–24)
CO2: 18 mmol/L — ABNORMAL LOW (ref 20–29)
Calcium: 10.1 mg/dL (ref 8.7–10.2)
Chloride: 107 mmol/L — ABNORMAL HIGH (ref 96–106)
Creatinine, Ser: 1.35 mg/dL — ABNORMAL HIGH (ref 0.57–1.00)
GFR calc Af Amer: 53 mL/min/{1.73_m2} — ABNORMAL LOW (ref >59)
GFR calc non Af Amer: 46 mL/min/{1.73_m2} — ABNORMAL LOW (ref >59)
Glucose: 103 mg/dL — ABNORMAL HIGH (ref 65–99)
Potassium: 4 mmol/L (ref 3.5–5.2)
Sodium: 140 mmol/L (ref 134–144)

## 2019-06-19 NOTE — Telephone Encounter (Signed)
Called and spoke with patient, I advised of creatinine levels and stressed the importance of keeping bp in range. Reminded that she restart the amlodipine as discussed. She is scheduled for bp repeat in 2 weeks. Asked that she continue all other meds, monitor blood pressure at home, asked to continue DASH diet. Reminded to go to ER for symptoms listed by provider. Patient verbalized understanding. Thanks!

## 2019-06-19 NOTE — Telephone Encounter (Signed)
-----   Message from [name], [place] sent at 06/19/2019  6:25 AM EST ----- Regarding: lab results Creatinine continues to be mildly elevated, no change since previous. It is really important to keep blood pressure within a normal range. Restart Amlodipine as discussed. It may be beneficial to decrease to 5 mg daily. Return in 2 weeks for blood pressure check with you.   Continue medication, monitor blood pressure at home. Continue DASH diet. Reminder to go to the ER if any CP, SOB, nausea, dizziness, severe HA, changes vision/speech, left arm numbness and tingling and jaw pain.   [name]  APRN, MSN, FNP-C Patient Abigail Beasley [address] [place], [place] [address] [phone]

## 2019-06-19 NOTE — Telephone Encounter (Signed)
-----   Message from [name], [place] sent at 06/19/2019  6:25 AM EST ----- Regarding: lab results Creatinine continues to be mildly elevated, no change since previous. It is really important to keep blood pressure within a normal range. Restart Amlodipine as discussed. It may be beneficial to decrease to 5 mg daily. Return in 2 weeks for blood pressure check with you.   Continue medication, monitor blood pressure at home. Continue DASH diet. Reminder to go to the ER if any CP, SOB, nausea, dizziness, severe HA, changes vision/speech, left arm numbness and tingling and jaw pain.   [name]  APRN, MSN, FNP-C Patient [facility] [address] [place], [place] [address] [phone]

## 2019-07-03 ENCOUNTER — Other Ambulatory Visit: Payer: Self-pay

## 2019-07-03 ENCOUNTER — Ambulatory Visit: Payer: Self-pay

## 2019-07-03 VITALS — BP 124/84

## 2019-07-03 DIAGNOSIS — I10 Essential (primary) hypertension: Secondary | ICD-10-CM

## 2019-09-07 ENCOUNTER — Ambulatory Visit: Payer: Self-pay | Attending: Internal Medicine

## 2019-09-07 DIAGNOSIS — Z23 Encounter for immunization: Secondary | ICD-10-CM

## 2019-09-07 NOTE — Progress Notes (Signed)
   Covid-19 Vaccination Clinic  Name:  Abigail Beasley    MRN: [id number] DOB: July 01, 1970  09/07/2019  Ms. Schaafsma was observed post Covid-19 immunization for 15 minutes without incident. She was provided with Vaccine Information Sheet and instruction to access the V-Safe system.   Ms. Broadbent was instructed to call 911 with any severe reactions post vaccine: Marland Kitchen Difficulty breathing  . Swelling of face and throat  . A fast heartbeat  . A bad rash all over body  . Dizziness and weakness   Immunizations Administered    Name Date Dose VIS Date Route   Pfizer COVID-19 Vaccine 09/07/2019  9:34 AM 0.3 mL 05/10/2019 Intramuscular   Manufacturer: [place]   Lot: [id number]   [place]: [id number]

## 2019-09-09 ENCOUNTER — Ambulatory Visit: Payer: Self-pay

## 2019-09-12 ENCOUNTER — Other Ambulatory Visit: Payer: Self-pay | Admitting: Family Medicine

## 2019-09-12 DIAGNOSIS — G629 Polyneuropathy, unspecified: Secondary | ICD-10-CM

## 2019-09-17 ENCOUNTER — Encounter: Payer: Self-pay | Admitting: Family Medicine

## 2019-09-17 ENCOUNTER — Ambulatory Visit (INDEPENDENT_AMBULATORY_CARE_PROVIDER_SITE_OTHER): Payer: HRSA Program | Admitting: Family Medicine

## 2019-09-17 ENCOUNTER — Other Ambulatory Visit: Payer: Self-pay

## 2019-09-17 ENCOUNTER — Ambulatory Visit: Payer: Self-pay | Admitting: Family Medicine

## 2019-09-17 VITALS — BP 142/82 | HR 66 | Temp 98.2°F | Resp 16 | Ht 67.0 in | Wt 253.0 lb

## 2019-09-17 DIAGNOSIS — K219 Gastro-esophageal reflux disease without esophagitis: Secondary | ICD-10-CM

## 2019-09-17 DIAGNOSIS — N181 Chronic kidney disease, stage 1: Secondary | ICD-10-CM

## 2019-09-17 DIAGNOSIS — G629 Polyneuropathy, unspecified: Secondary | ICD-10-CM

## 2019-09-17 DIAGNOSIS — I10 Essential (primary) hypertension: Secondary | ICD-10-CM

## 2019-09-17 LAB — POCT URINALYSIS DIPSTICK
Bilirubin, UA: NEGATIVE
Blood, UA: NEGATIVE
Glucose, UA: NEGATIVE
Ketones, UA: NEGATIVE
Nitrite, UA: NEGATIVE
Protein, UA: POSITIVE — AB
Spec Grav, UA: 1.02 (ref 1.010–1.025)
Urobilinogen, UA: 1 E.U./dL
pH, UA: 6 (ref 5.0–8.0)

## 2019-09-17 MED ORDER — GABAPENTIN 300 MG PO CAPS: 300.0000 mg | ORAL_CAPSULE | Freq: Three times a day (TID) | ORAL | 2 refills | Status: DC

## 2019-09-17 MED ORDER — OMEPRAZOLE 40 MG PO CPDR: 40.0000 mg | DELAYED_RELEASE_CAPSULE | Freq: Every day | ORAL | 2 refills | Status: DC

## 2019-09-17 NOTE — Patient Instructions (Addendum)
Recommend Carpel Tunnel brace for right extremity ([employer] or [employer]). Increased Gabapentin to three times daily. Do not drink alcohol, drive, or operate machinery while taking this medication.  Start a trial of omeprazole 40 mg daily. Change diet as discussed.    Food Choices for Gastroesophageal Reflux Disease, Adult When you have gastroesophageal reflux disease (GERD), the foods you eat and your eating habits are very important. Choosing the right foods can help ease your discomfort. Think about working with a nutrition specialist (dietitian) to help you make good choices. What are tips for following this plan?  Meals  Choose healthy foods that are low in fat, such as fruits, vegetables, whole grains, low-fat dairy products, and lean meat, fish, and poultry.  Eat small meals often instead of 3 large meals a day. Eat your meals slowly, and in a place where you are relaxed. Avoid bending over or lying down until 2-3 hours after eating.  Avoid eating meals 2-3 hours before bed.  Avoid drinking a lot of liquid with meals.  Cook foods using methods other than frying. Bake, grill, or broil food instead.  Avoid or limit: ? Chocolate. ? Peppermint or spearmint. ? Alcohol. ? Pepper. ? Black and decaffeinated coffee. ? Black and decaffeinated tea. ? Bubbly (carbonated) soft drinks. ? Caffeinated energy drinks and soft drinks.  Limit high-fat foods such as: ? Fatty meat or fried foods. ? Whole milk, cream, butter, or ice cream. ? Nuts and nut butters. ? Pastries, donuts, and sweets made with butter or shortening.  Avoid foods that cause symptoms. These foods may be different for everyone. Common foods that cause symptoms include: ? Tomatoes. ? Oranges, lemons, and limes. ? Peppers. ? Spicy food. ? Onions and garlic. ? Vinegar. Lifestyle  Maintain a healthy weight. Ask your doctor what weight is healthy for you. If you need to lose weight, work with your doctor to do so  safely.  Exercise for at least 30 minutes for 5 or more days each week, or as told by your doctor.  Wear loose-fitting clothes.  Do not smoke. If you need help quitting, ask your doctor.  Sleep with the head of your bed higher than your feet. Use a wedge under the mattress or blocks under the bed frame to raise the head of the bed. Summary  When you have gastroesophageal reflux disease (GERD), food and lifestyle choices are very important in easing your symptoms.  Eat small meals often instead of 3 large meals a day. Eat your meals slowly, and in a place where you are relaxed.  Limit high-fat foods such as fatty meat or fried foods.  Avoid bending over or lying down until 2-3 hours after eating.  Avoid peppermint and spearmint, caffeine, alcohol, and chocolate. This information is not intended to replace advice given to you by your health care provider. Make sure you discuss any questions you have with your health care provider. Document Revised: 09/06/2018 Document Reviewed: 06/21/2016 Elsevier Patient Education  [place].
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
## 2019-09-18 LAB — BASIC METABOLIC PANEL
BUN/Creatinine Ratio: 10 (ref 9–23)
BUN: 15 mg/dL (ref 6–24)
CO2: 17 mmol/L — ABNORMAL LOW (ref 20–29)
Calcium: 9.5 mg/dL (ref 8.7–10.2)
Chloride: 104 mmol/L (ref 96–106)
Creatinine, Ser: 1.47 mg/dL — ABNORMAL HIGH (ref 0.57–1.00)
GFR calc Af Amer: 48 mL/min/{1.73_m2} — ABNORMAL LOW (ref >59)
GFR calc non Af Amer: 41 mL/min/{1.73_m2} — ABNORMAL LOW (ref >59)
Glucose: 92 mg/dL (ref 65–99)
Potassium: 4.2 mmol/L (ref 3.5–5.2)
Sodium: 140 mmol/L (ref 134–144)

## 2019-10-01 ENCOUNTER — Ambulatory Visit: Payer: Self-pay | Attending: Internal Medicine

## 2019-10-01 DIAGNOSIS — Z23 Encounter for immunization: Secondary | ICD-10-CM

## 2019-10-01 NOTE — Progress Notes (Signed)
   Covid-19 Vaccination Clinic  Name:  Abigail CLOOS    MRN: [id number] DOB: July 03, 1970  10/01/2019  Ms. Patch was observed post Covid-19 immunization for 15 minutes without incident. She was provided with Vaccine Information Sheet and instruction to access the V-Safe system.   [name] was instructed to call 911 with any severe reactions post vaccine: Marland Kitchen Difficulty breathing  . Swelling of face and throat  . A fast heartbeat  . A bad rash all over body  . Dizziness and weakness   Immunizations Administered    Name Date Dose VIS Date Route   Pfizer COVID-19 Vaccine 10/01/2019  8:24 AM 0.3 mL 07/24/2018 Intramuscular   Manufacturer: [place]   Lot: [id number]   [place]: [id number]

## 2019-10-23 ENCOUNTER — Other Ambulatory Visit: Payer: Self-pay | Admitting: Family Medicine

## 2019-10-23 DIAGNOSIS — G629 Polyneuropathy, unspecified: Secondary | ICD-10-CM

## 2019-10-24 NOTE — Telephone Encounter (Signed)
Refill Gabapentin 

## 2019-12-05 ENCOUNTER — Other Ambulatory Visit: Payer: Self-pay | Admitting: Nurse Practitioner

## 2019-12-05 DIAGNOSIS — G629 Polyneuropathy, unspecified: Secondary | ICD-10-CM

## 2019-12-09 NOTE — Telephone Encounter (Signed)
Please review  Thank you

## 2019-12-12 ENCOUNTER — Other Ambulatory Visit: Payer: Self-pay | Admitting: Family Medicine

## 2019-12-17 ENCOUNTER — Ambulatory Visit: Payer: Self-pay | Admitting: Family Medicine

## 2019-12-17 ENCOUNTER — Encounter: Payer: Self-pay | Admitting: Family Medicine

## 2019-12-17 ENCOUNTER — Ambulatory Visit (INDEPENDENT_AMBULATORY_CARE_PROVIDER_SITE_OTHER): Payer: HRSA Program | Admitting: Family Medicine

## 2019-12-17 ENCOUNTER — Other Ambulatory Visit: Payer: Self-pay

## 2019-12-17 VITALS — BP 142/90 | HR 60 | Temp 97.3°F | Resp 16 | Ht 67.0 in | Wt 259.0 lb

## 2019-12-17 DIAGNOSIS — G629 Polyneuropathy, unspecified: Secondary | ICD-10-CM

## 2019-12-17 DIAGNOSIS — Z131 Encounter for screening for diabetes mellitus: Secondary | ICD-10-CM

## 2019-12-17 DIAGNOSIS — I10 Essential (primary) hypertension: Secondary | ICD-10-CM

## 2019-12-17 LAB — POCT URINALYSIS DIPSTICK
Bilirubin, UA: NEGATIVE
Blood, UA: NEGATIVE
Glucose, UA: NEGATIVE
Ketones, UA: NEGATIVE
Nitrite, UA: NEGATIVE
Protein, UA: POSITIVE — AB
Spec Grav, UA: 1.025 (ref 1.010–1.025)
Urobilinogen, UA: 1 E.U./dL
pH, UA: 6 (ref 5.0–8.0)

## 2019-12-17 LAB — POCT GLYCOSYLATED HEMOGLOBIN (HGB A1C): Hemoglobin A1C: 5.2 % (ref 4.0–5.6)

## 2019-12-17 MED ORDER — GABAPENTIN 300 MG PO CAPS: ORAL_CAPSULE | ORAL | 2 refills | Status: DC

## 2019-12-17 NOTE — Patient Instructions (Signed)
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
## 2019-12-18 LAB — BASIC METABOLIC PANEL
BUN/Creatinine Ratio: 18 (ref 9–23)
BUN: 20 mg/dL (ref 6–24)
CO2: 19 mmol/L — ABNORMAL LOW (ref 20–29)
Calcium: 9.2 mg/dL (ref 8.7–10.2)
Chloride: 107 mmol/L — ABNORMAL HIGH (ref 96–106)
Creatinine, Ser: 1.14 mg/dL — ABNORMAL HIGH (ref 0.57–1.00)
GFR calc Af Amer: 65 mL/min/{1.73_m2} (ref >59)
GFR calc non Af Amer: 56 mL/min/{1.73_m2} — ABNORMAL LOW (ref >59)
Glucose: 88 mg/dL (ref 65–99)
Potassium: 4.5 mmol/L (ref 3.5–5.2)
Sodium: 142 mmol/L (ref 134–144)

## 2019-12-18 LAB — CBC
Hematocrit: 42.8 % (ref 34.0–46.6)
Hemoglobin: 14.5 g/dL (ref 11.1–15.9)
MCH: 30.4 pg (ref 26.6–33.0)
MCHC: 33.9 g/dL (ref 31.5–35.7)
MCV: 90 fL (ref 79–97)
Platelets: 270 10*3/uL (ref 150–450)
RBC: 4.77 x10E6/uL (ref 3.77–5.28)
RDW: 12.5 % (ref 11.7–15.4)
WBC: 5 10*3/uL (ref 3.4–10.8)

## 2019-12-18 LAB — VITAMIN B12: Vitamin B-12: 484 pg/mL (ref 232–1245)

## 2020-01-16 ENCOUNTER — Other Ambulatory Visit: Payer: Self-pay | Admitting: Family Medicine

## 2020-03-17 ENCOUNTER — Ambulatory Visit: Payer: Self-pay | Admitting: Family Medicine

## 2020-03-18 ENCOUNTER — Other Ambulatory Visit: Payer: Self-pay | Admitting: Family Medicine

## 2020-04-21 ENCOUNTER — Ambulatory Visit: Payer: Self-pay | Admitting: Family Medicine

## 2020-05-06 ENCOUNTER — Other Ambulatory Visit: Payer: Self-pay

## 2020-05-06 ENCOUNTER — Emergency Department (HOSPITAL_COMMUNITY): Payer: Self-pay

## 2020-05-06 ENCOUNTER — Emergency Department (HOSPITAL_COMMUNITY)
Admission: EM | Admit: 2020-05-06 | Discharge: 2020-05-06 | Disposition: A | Discharge status: Home / Self Care | Payer: Self-pay | Attending: Emergency Medicine | Admitting: Emergency Medicine

## 2020-05-06 DIAGNOSIS — H66001 Acute suppurative otitis media without spontaneous rupture of ear drum, right ear: Secondary | ICD-10-CM

## 2020-05-06 DIAGNOSIS — Z20822 Contact with and (suspected) exposure to covid-19: Secondary | ICD-10-CM | POA: Insufficient documentation

## 2020-05-06 DIAGNOSIS — H66002 Acute suppurative otitis media without spontaneous rupture of ear drum, left ear: Secondary | ICD-10-CM | POA: Insufficient documentation

## 2020-05-06 DIAGNOSIS — J988 Other specified respiratory disorders: Secondary | ICD-10-CM

## 2020-05-06 DIAGNOSIS — I10 Essential (primary) hypertension: Secondary | ICD-10-CM | POA: Insufficient documentation

## 2020-05-06 LAB — CBC WITH DIFFERENTIAL/PLATELET
Abs Immature Granulocytes: 0.04 10*3/uL (ref 0.00–0.07)
Basophils Absolute: 0.1 10*3/uL (ref 0.0–0.1)
Basophils Relative: 1 %
Eosinophils Absolute: 0.2 10*3/uL (ref 0.0–0.5)
Eosinophils Relative: 3 %
HCT: 43.6 % (ref 36.0–46.0)
Hemoglobin: 13.9 g/dL (ref 12.0–15.0)
Immature Granulocytes: 1 %
Lymphocytes Relative: 23 %
Lymphs Abs: 1.5 10*3/uL (ref 0.7–4.0)
MCH: 30.3 pg (ref 26.0–34.0)
MCHC: 31.9 g/dL (ref 30.0–36.0)
MCV: 95.2 fL (ref 80.0–100.0)
Monocytes Absolute: 0.5 10*3/uL (ref 0.1–1.0)
Monocytes Relative: 8 %
Neutro Abs: 4.3 10*3/uL (ref 1.7–7.7)
Neutrophils Relative %: 64 %
Platelets: 267 10*3/uL (ref 150–400)
RBC: 4.58 MIL/uL (ref 3.87–5.11)
RDW: 12 % (ref 11.5–15.5)
WBC: 6.7 10*3/uL (ref 4.0–10.5)
nRBC: 0 % (ref 0.0–0.2)

## 2020-05-06 LAB — RESP PANEL BY RT-PCR (FLU A&B, COVID) ARPGX2
Influenza A by PCR: NEGATIVE
Influenza B by PCR: NEGATIVE
SARS Coronavirus 2 by RT PCR: NEGATIVE

## 2020-05-06 LAB — BASIC METABOLIC PANEL
Anion gap: 9 (ref 5–15)
BUN: 17 mg/dL (ref 6–20)
CO2: 25 mmol/L (ref 22–32)
Calcium: 9.4 mg/dL (ref 8.9–10.3)
Chloride: 103 mmol/L (ref 98–111)
Creatinine, Ser: 1.12 mg/dL — ABNORMAL HIGH (ref 0.44–1.00)
GFR, Estimated: 60 mL/min — ABNORMAL LOW (ref >60)
Glucose, Bld: 91 mg/dL (ref 70–99)
Potassium: 4.3 mmol/L (ref 3.5–5.1)
Sodium: 137 mmol/L (ref 135–145)

## 2020-05-06 MED ORDER — AMOXICILLIN-POT CLAVULANATE 875-125 MG PO TABS: 1.0000 | ORAL_TABLET | Freq: Two times a day (BID) | ORAL | 0 refills | Status: DC

## 2020-05-06 MED ORDER — BENZONATATE 100 MG PO CAPS: 100.0000 mg | ORAL_CAPSULE | Freq: Three times a day (TID) | ORAL | 0 refills | Status: DC

## 2020-05-06 NOTE — ED Provider Notes (Signed)
[facility] DEPT Provider Note   CSN: [id number] Arrival date & time: 05/06/20  7106     History Chief Complaint  Patient presents with  . Shortness of Breath  . Chills  . Fever  . Eye Problem    Abigail Beasley is a 49 y.o. female.  Patient with history of hypertension, history of positive Covid infection, history of Covid vaccine April/May 2021 --presents the emergency department with 3 days of fever, chills, cough, right ear pain, sore throat, body aches, red eyes.  Patient was exposed to a coworker who she found out tested positive for coronavirus.  Patient has been taking OTC meds including naproxen at home.  Cough is productive of yellow mucus.  No abdominal pain, vomiting, diarrhea. No hematuria or irritative UTI symptoms including dysuria, increased frequency or urgency.          Past Medical History:  Diagnosis Date  . Allergy   . Elevated serum creatinine 01/2019  . History of proteinuria syndrome 01/2019  . Hypertension   . Left flank pain 01/2019  . PONV (postoperative nausea and vomiting)     Patient Active Problem List   Diagnosis Date Noted  . Status post laparoscopic hysterectomy 11/08/2016  . Severe obesity (BMI >= 40) ([place]) 07/28/2014  . Dizziness and giddiness 03/18/2013  . Essential hypertension, benign 03/18/2013  . Breast pain, left 03/18/2013    Past Surgical History:  Procedure Laterality Date  . ABDOMINAL HYSTERECTOMY    . CYSTOSCOPY N/A 11/08/2016   Procedure: CYSTOSCOPY;  Surgeon: Salvadore Dom, MD;  Location: [facility];  Service: Gynecology;  Laterality: N/A;  . DILATION AND EVACUATION     miscarriage  . TOTAL LAPAROSCOPIC HYSTERECTOMY WITH SALPINGECTOMY Bilateral 11/08/2016   Procedure: HYSTERECTOMY TOTAL LAPAROSCOPIC WITH BILATERAL SALPINGECTOMY;  Surgeon: Salvadore Dom, MD;  Location: [facility];  Service: Gynecology;  Laterality: Bilateral;  . TUBAL LIGATION       OB History    Gravida  4    Para  2   Term  2   Preterm      AB  2   Living  2     SAB  1   TAB      Ectopic      Multiple      Live Births  2           Family History  Problem Relation Age of Onset  . Diabetes Mother   . Hypertension Mother   . Cancer Mother        liver   . Hyperlipidemia Mother   . Stroke Mother   . Diabetes Maternal Grandmother   . Hypertension Maternal Grandmother   . Hyperlipidemia Maternal Grandmother   . Heart disease Maternal Grandmother   . Hypertension Sister   . Cancer Maternal Grandfather        prostate  . Diabetes Maternal Grandfather   . Hyperlipidemia Maternal Grandfather   . Hypertension Maternal Grandfather   . Colon cancer Maternal Grandfather   . Diabetes Sister   . Diabetes Maternal Uncle     Social History   Tobacco Use  . Smoking status: Never Smoker  . Smokeless tobacco: Never Used  Vaping Use  . Vaping Use: Never used  Substance Use Topics  . Alcohol use: Yes    Alcohol/week: 3.0 standard drinks    Types: 3 Standard drinks or equivalent per week  . Drug use: Yes    Types: Marijuana  Comment: occ    Home Medications Prior to Admission medications   Medication Sig Start Date End Date Taking? Authorizing Provider  gabapentin (NEURONTIN) 300 MG capsule Take 1 capsule every 8 hours 12/17/19   [name], FNP  Multiple Vitamin (MULTIVITAMIN WITH MINERALS) TABS tablet Take 1 tablet by mouth 4 (four) times a week.     [provider]  omeprazole (PRILOSEC) 40 MG capsule Take 1 capsule (40 mg total) by mouth daily. Patient not taking: Reported on 12/17/2019 09/17/19   [name], FNP  triamterene-hydrochlorothiazide [facility]) 37.5-25 MG tablet Take 1 tablet by mouth once daily 03/19/20   [name], FNP    Allergies    Lisinopril  Review of Systems   Review of Systems  Constitutional: Positive for chills, fatigue and fever.  HENT: Positive for congestion, ear pain and sore throat. Negative for  rhinorrhea.   Eyes: Positive for redness.  Respiratory: Positive for cough and shortness of breath.   Cardiovascular: Positive for chest pain (with cough).  Gastrointestinal: Negative for abdominal pain, diarrhea, nausea and vomiting.  Genitourinary: Negative for dysuria, frequency, hematuria and urgency.  Musculoskeletal: Positive for myalgias.  Skin: Negative for rash.  Neurological: Positive for headaches.    Physical Exam Updated Vital Signs BP (!) 203/113 (BP Location: Right Arm) Comment: pt took bp meds this morning  Pulse 64   Temp 98.2 F (36.8 C) (Oral)   Resp 20   Ht 5\' 7"  (1.702 m)   Wt 115.2 kg   LMP 10/17/2016 (Exact Date)   SpO2 98%   BMI 39.78 kg/m   Physical Exam Vitals and nursing note reviewed.  Constitutional:      General: She is not in acute distress.    Appearance: She is well-developed.  HENT:     Head: Normocephalic and atraumatic.     Right Ear: External ear normal. Tympanic membrane is erythematous and bulging.     Left Ear: External ear normal. Tympanic membrane is not erythematous or bulging.     Nose: Nose normal.  Eyes:     Extraocular Movements: Extraocular movements intact.     Conjunctiva/sclera: Conjunctivae normal.     Pupils: Pupils are equal, round, and reactive to light.     Comments: Mild injection, no exudates.   Cardiovascular:     Rate and Rhythm: Normal rate and regular rhythm.     Heart sounds: No murmur heard.   Pulmonary:     Effort: No respiratory distress.     Breath sounds: No wheezing, rhonchi or rales.  Abdominal:     Palpations: Abdomen is soft.     Tenderness: There is no abdominal tenderness. There is no guarding or rebound.  Musculoskeletal:     Cervical back: Normal range of motion and neck supple.     Right lower leg: No edema.     Left lower leg: No edema.  Skin:    General: Skin is warm and dry.     Findings: No rash.  Neurological:     General: No focal deficit present.     Mental Status: She is  alert. Mental status is at baseline.     Motor: No weakness.  Psychiatric:        Mood and Affect: Mood normal.     ED Results / Procedures / Treatments   Labs (all labs ordered are listed, but only abnormal results are displayed) Labs Reviewed  BASIC METABOLIC PANEL - Abnormal; Notable for the following components:  Result Value   Creatinine, Ser 1.12 (*)    GFR, Estimated 60 (*)    All other components within normal limits  RESP PANEL BY RT-PCR (FLU A&B, COVID) ARPGX2  CBC WITH DIFFERENTIAL/PLATELET    EKG EKG Interpretation  Date/Time:  Wednesday May 06 2020 11:03:59 EST Ventricular Rate:  63 PR Interval:    QRS Duration: 83 QT Interval:  402 QTC Calculation: 412 R Axis:   77 Text Interpretation: Sinus rhythm ST elev, probable normal early repol pattern Baseline wander in lead(s) V2 Confirmed by Milton Ferguson [phone]) on 05/06/2020 11:06:59 AM   Radiology DG Chest Port 1 View  Result Date: 05/06/2020 CLINICAL DATA:  Short of breath fever.  COVID exposure. EXAM: PORTABLE CHEST 1 VIEW COMPARISON:  None. FINDINGS: The heart size and mediastinal contours are within normal limits. Both lungs are clear. The visualized skeletal structures are unremarkable. IMPRESSION: No active disease. Electronically Signed   By: Franchot Gallo M.D.   On: 05/06/2020 10:03    Procedures Procedures (including critical care time)  Medications Ordered in ED Medications - No data to display  ED Course  I have reviewed the triage vital signs and the nursing notes.  Pertinent labs & imaging results that were available during my care of the patient were reviewed by me and considered in my medical decision making (see chart for details).  Patient seen and examined. Work-up initiated.  Overall, patient is well-appearing.  She is hypertensive, will recheck BP.  She does have otitis media on the right ear.  Will check basic labs, chest x-ray, Covid test.  Vital signs reviewed and are as  follows: BP (!) 203/113 (BP Location: Right Arm) Comment: pt took bp meds this morning  Pulse 64   Temp 98.2 F (36.8 C) (Oral)   Resp 20   Ht 5\' 7"  (1.702 m)   Wt 115.2 kg   LMP 10/17/2016 (Exact Date)   SpO2 98%   BMI 39.78 kg/m   11:50 AM Pt updated on results. Neg COVID/flu.  Suspect viral syndrome + otitis media. Rx Augmentin and tessalon.   Patient counseled on supportive care for viral URI and s/s to return including worsening symptoms, persistent fever, persistent vomiting, or if they have any other concerns. Urged to see PCP if symptoms persist for more than 3 days. Patient verbalizes understanding and agrees with plan.      MDM Rules/Calculators/A&P                          Viral symptoms: Pt with URI sx + cough c/w viral syndrome. COVID/flu negative. CXR neg. Pt looks well with normal vitals. Does have red, bulging right TM c/w otitis media.   HTN: Continue home meds, no signs of end organ damage.    Final Clinical Impression(s) / ED Diagnoses Final diagnoses:  Non-recurrent acute suppurative otitis media of right ear without spontaneous rupture of tympanic membrane  Respiratory infection    Rx / DC Orders ED Discharge Orders         Ordered    amoxicillin-clavulanate (AUGMENTIN) 875-125 MG tablet  Every 12 hours        05/06/20 1145    benzonatate (TESSALON) 100 MG capsule  Every 8 hours        05/06/20 1145           Carlisle Cater, PA-C 05/06/20 1153    Milton Ferguson, MD 05/06/20 1255

## 2020-05-06 NOTE — ED Triage Notes (Signed)
Pt POV- Pt reports being fully vaccinated, had recent exposure to COVID-19 from coworker.  Pt c/o ShOB, fever, chills.  Pt reports symptomatic fever at home, took aleve appx 0615 today.   Pt reports sx x3 days. Also c/o chest pain when coughing.   Also c/o eye redness in right eye x5 days.

## 2020-05-06 NOTE — Discharge Instructions (Signed)
Please read and follow all provided instructions.  Your diagnoses today include:  1. Non-recurrent acute suppurative otitis media of right ear without spontaneous rupture of tympanic membrane   2. Respiratory infection    You appear to have an upper respiratory infection (URI). An upper respiratory tract infection, or cold, is a viral infection of the air passages leading to the lungs. It should improve gradually after 5-7 days. You may have a lingering cough that lasts for 2- 4 weeks after the infection.  Tests performed today include:  Vital signs. See below for your results today.  Blood cell counts (white, red, and platelets) Electrolytes  Kidney function test COVID/flu testing - negative  Medications prescribed:   Augmentin - antibiotic  You have been prescribed an antibiotic medicine: take the entire course of medicine even if you are feeling better. Stopping early can cause the antibiotic not to work.   Tessalon Perles - cough suppressant medication  Take any prescribed medications only as directed. Treatment for your infection is aimed at treating the symptoms. There are no medications, such as antibiotics, that will cure your infection.   Home care instructions:  Follow any educational materials contained in this packet.   Your illness is contagious and can be spread to others, especially during the first 3 or 4 days. It cannot be cured by antibiotics or other medicines. Take basic precautions such as washing your hands often, covering your mouth when you cough or sneeze, and avoiding public places where you could spread your illness to others.   Please continue drinking plenty of fluids.  Use over-the-counter medicines as needed as directed on packaging for symptom relief.  You may also use ibuprofen or tylenol as directed on packaging for pain or fever.  Do not take multiple medicines containing Tylenol or acetaminophen to avoid taking too much of this  medication.  Follow-up instructions: Please follow-up with your primary care provider in the next 3 days for further evaluation of your symptoms if you are not feeling better.   Return instructions:   Please return to the Emergency Department if you experience worsening symptoms.   RETURN IMMEDIATELY IF you develop shortness of breath, confusion or altered mental status, a new rash, become dizzy, faint, or poorly responsive, or are unable to be cared for at home.  Please return if you have persistent vomiting and cannot keep down fluids or develop a fever that is not controlled by tylenol or motrin.    Please return if you have any other emergent concerns.  Additional Information:  Your vital signs today were: BP (!) 188/111   Pulse 74   Temp 98.2 F (36.8 C) (Oral)   Resp 18   Ht 5\' 7"  (1.702 m)   Wt 115.2 kg   LMP 10/17/2016 (Exact Date)   SpO2 99%   BMI 39.78 kg/m  If your blood pressure (BP) was elevated above 135/85 this visit, please have this repeated by your doctor within one month. --------------

## 2020-05-08 ENCOUNTER — Ambulatory Visit: Payer: Self-pay | Attending: Internal Medicine

## 2020-05-08 DIAGNOSIS — Z23 Encounter for immunization: Secondary | ICD-10-CM

## 2020-05-08 NOTE — Progress Notes (Signed)
   Covid-19 Vaccination Clinic  Name:  Abigail Beasley    MRN: [id number] DOB: 07-02-1970  05/08/2020  [name] was observed post Covid-19 immunization for 15 minutes without incident. She was provided with Vaccine Information Sheet and instruction to access the V-Safe system.   [name] was instructed to call 911 with any severe reactions post vaccine: Marland Kitchen Difficulty breathing  . Swelling of face and throat  . A fast heartbeat  . A bad rash all over body  . Dizziness and weakness   Immunizations Administered    Name Date Dose VIS Date Route   Pfizer COVID-19 Vaccine 05/08/2020  1:36 PM 0.3 mL 03/18/2020 Intramuscular   Manufacturer: [place]   Lot: [id number]   NDC: [address]-6

## 2020-06-03 ENCOUNTER — Other Ambulatory Visit: Payer: Self-pay | Admitting: Family Medicine

## 2020-06-03 DIAGNOSIS — G629 Polyneuropathy, unspecified: Secondary | ICD-10-CM

## 2020-06-23 ENCOUNTER — Ambulatory Visit (INDEPENDENT_AMBULATORY_CARE_PROVIDER_SITE_OTHER): Payer: Self-pay | Admitting: Family Medicine

## 2020-06-23 ENCOUNTER — Encounter: Payer: Self-pay | Admitting: Family Medicine

## 2020-06-23 ENCOUNTER — Other Ambulatory Visit: Payer: Self-pay

## 2020-06-23 VITALS — BP 176/84 | HR 63 | Temp 97.4°F | Ht 67.0 in | Wt 268.4 lb

## 2020-06-23 DIAGNOSIS — I10 Essential (primary) hypertension: Secondary | ICD-10-CM | POA: Diagnosis not present

## 2020-06-23 DIAGNOSIS — Z1159 Encounter for screening for other viral diseases: Secondary | ICD-10-CM | POA: Diagnosis not present

## 2020-06-23 NOTE — Patient Instructions (Signed)

## 2020-06-23 NOTE — Progress Notes (Signed)
Patient Abigail Beasley   Established Patient Office Visit  Subjective:  Patient ID: Abigail Beasley, female    DOB: 06-Aug-1970  Age: 50 y.o. MRN: [id number]  CC:  Chief Complaint  Patient presents with  . Follow-up    6 month follow up , htn , having swelling on lt ankle , headache , numbness tingling  on left arm   Abigail Beasley is a 50 year old female with a medical history history significant for hypertension and obesity presents for follow-up of chronic disease.  Patient is also complaining of increased stressors in her home.  She endorses some anxiety, worry, and anhedonia.  Patient has not been treated for anxiety in the past.  She denies any suicidal or homicidal intent.  She is not on any antianxiety medications at this time. Patient has not been checking blood pressure at home.  She has been taking medication consistently.  She endorses some swelling to bilateral lower extremities.  She has not been following a low-fat, low carbohydrate diet.  Also, she states that she has been mostly sedentary over the past several weeks due to cold weather. Hypertension This is a chronic problem. The problem is uncontrolled. Associated symptoms include peripheral edema. Pertinent negatives include no anxiety, blurred vision, chest pain, headaches, orthopnea, palpitations, shortness of breath or sweats. Risk factors for coronary artery disease include obesity. Hypertensive end-organ damage includes retinopathy. There is no history of kidney disease or CAD/MI.  Anxiety Presents for follow-up visit. Symptoms include depressed mood, dizziness, excessive worry and restlessness. Patient reports no chest pain, irritability, nausea, palpitations, shortness of breath or suicidal ideas. Symptoms occur most days. The quality of sleep is fair. Nighttime awakenings: none.       Past Medical History:  Diagnosis Date  . Allergy   . Elevated serum creatinine 01/2019  . History of  proteinuria syndrome 01/2019  . Hypertension   . Left flank pain 01/2019  . PONV (postoperative nausea and vomiting)     Past Surgical History:  Procedure Laterality Date  . ABDOMINAL HYSTERECTOMY    . CYSTOSCOPY N/A 11/08/2016   Procedure: CYSTOSCOPY;  Surgeon: Salvadore Dom, MD;  Location: [facility];  Service: Gynecology;  Laterality: N/A;  . DILATION AND EVACUATION     miscarriage  . TOTAL LAPAROSCOPIC HYSTERECTOMY WITH SALPINGECTOMY Bilateral 11/08/2016   Procedure: HYSTERECTOMY TOTAL LAPAROSCOPIC WITH BILATERAL SALPINGECTOMY;  Surgeon: Salvadore Dom, MD;  Location: [facility];  Service: Gynecology;  Laterality: Bilateral;  . TUBAL LIGATION      Family History  Problem Relation Age of Onset  . Diabetes Mother   . Hypertension Mother   . Cancer Mother        liver   . Hyperlipidemia Mother   . Stroke Mother   . Diabetes Maternal Grandmother   . Hypertension Maternal Grandmother   . Hyperlipidemia Maternal Grandmother   . Heart disease Maternal Grandmother   . Hypertension Sister   . Cancer Maternal Grandfather        prostate  . Diabetes Maternal Grandfather   . Hyperlipidemia Maternal Grandfather   . Hypertension Maternal Grandfather   . Colon cancer Maternal Grandfather   . Diabetes Sister   . Diabetes Maternal Uncle     Social History   Socioeconomic History  . Marital status: Single    Spouse name: Not on file  . Number of children: Not on file  . Years of education: Not on file  . Highest  education level: Not on file  Occupational History  . Not on file  Tobacco Use  . Smoking status: Never Smoker  . Smokeless tobacco: Never Used  Vaping Use  . Vaping Use: Never used  Substance and Sexual Activity  . Alcohol use: Yes    Alcohol/week: 3.0 standard drinks    Types: 3 Standard drinks or equivalent per week  . Drug use: Yes    Types: Marijuana    Comment: occ  . Sexual activity: Not Currently    Partners: Male    Birth control/protection:  Surgical  Other Topics Concern  . Not on file  Social History Narrative   Marital status: single; not dating.      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      Tobacco: none      Alcohol:  Socially; rarely.      Drugs: none; marijuana in the past.      Exercise: none      History of incarceration for six years (guilt by association; friend robbed bank).      Sexual activity:  Males; last STD Gonorrhea at age 58.  Syphilis in twenties.    Social Determinants of Health   Financial Resource Strain: Not on file  Food Insecurity: Not on file  Transportation Needs: Not on file  Physical Activity: Not on file  Stress: Not on file  Social Connections: Not on file  Intimate Partner Violence: Not on file    Outpatient Medications Prior to Visit  Medication Sig Dispense Refill  . benzonatate (TESSALON) 100 MG capsule Take 1 capsule (100 mg total) by mouth every 8 (eight) hours. 15 capsule 0  . gabapentin (NEURONTIN) 300 MG capsule TAKE 1 CAPSULE BY MOUTH EVERY 8 HOURS 90 capsule 0  . Multiple Vitamin (MULTIVITAMIN WITH MINERALS) TABS tablet Take 1 tablet by mouth 4 (four) times a week.    . triamterene-hydrochlorothiazide (MAXZIDE-25) 37.5-25 MG tablet Take 1 tablet by mouth once daily 30 tablet 2  . amoxicillin-clavulanate (AUGMENTIN) 875-125 MG tablet Take 1 tablet by mouth every 12 (twelve) hours. (Patient not taking: Reported on 06/23/2020) 14 tablet 0   No facility-administered medications prior to visit.    Allergies  Allergen Reactions  . Lisinopril Cough    ROS Review of Systems  Constitutional: Negative for irritability.  HENT: Negative.   Eyes: Negative.  Negative for blurred vision.  Respiratory: Negative.  Negative for shortness of breath.   Cardiovascular: Negative.  Negative for chest pain, palpitations and orthopnea.  Gastrointestinal: Negative for nausea.  Genitourinary:  Negative.   Musculoskeletal: Positive for arthralgias.  Skin: Negative.   Neurological: Positive for dizziness and numbness. Negative for headaches.  Hematological: Negative.   Psychiatric/Behavioral: Negative.  Negative for suicidal ideas.      Objective:    Physical Exam Constitutional:      Appearance: She is obese.  Eyes:     Pupils: Pupils are equal, round, and reactive to light.  Cardiovascular:     Rate and Rhythm: Normal rate and regular rhythm.     Pulses: Normal pulses.  Pulmonary:     Effort: Pulmonary effort is normal.  Abdominal:     General: Bowel sounds are normal.  Skin:    General: Skin is warm.  Neurological:     General: No focal deficit present.     Mental Status: Mental status  is at baseline.  Psychiatric:        Mood and Affect: Mood normal.        Behavior: Behavior normal.        Thought Content: Thought content normal.        Judgment: Judgment normal.   ]  Ht 5\' 7"  (1.702 m)   Wt 268 lb 6.4 oz (121.7 kg)   LMP 10/17/2016 (Exact Date)   BMI 42.04 kg/m  Wt Readings from Last 3 Encounters:  06/23/20 268 lb 6.4 oz (121.7 kg)  05/06/20 254 lb (115.2 kg)  12/17/19 259 lb (117.5 kg)     Health Maintenance Due  Topic Date Due  . Hepatitis C Screening  Never done  . COLONOSCOPY (Pts 45-40yrs Insurance coverage will need to be confirmed)  Never done  . MAMMOGRAM  01/30/2019  . PAP SMEAR-Modifier  10/20/2019    There are no preventive Beasley reminders to display for this patient.  Lab Results  Component Value Date   TSH 1.990 11/01/2017   Lab Results  Component Value Date   WBC 6.7 05/06/2020   HGB 13.9 05/06/2020   HCT 43.6 05/06/2020   MCV 95.2 05/06/2020   PLT 267 05/06/2020   Lab Results  Component Value Date   NA 137 05/06/2020   K 4.3 05/06/2020   CO2 25 05/06/2020   GLUCOSE 91 05/06/2020   BUN 17 05/06/2020   CREATININE 1.12 (H) 05/06/2020   BILITOT 0.3 02/18/2019   ALKPHOS 72 02/18/2019   AST 16 02/18/2019   ALT 13  02/18/2019   PROT 6.7 02/18/2019   ALBUMIN 3.9 02/18/2019   CALCIUM 9.4 05/06/2020   ANIONGAP 9 05/06/2020   Lab Results  Component Value Date   CHOL 189 01/03/2019   Lab Results  Component Value Date   HDL 59 01/03/2019   Lab Results  Component Value Date   LDLCALC 109 (H) 01/03/2019   Lab Results  Component Value Date   TRIG 104 01/03/2019   Lab Results  Component Value Date   CHOLHDL 3.2 01/03/2019   Lab Results  Component Value Date   HGBA1C 5.2 12/17/2019   BP (!) 173/92 (BP Location: Right Arm, Patient Position: Sitting, Cuff Size: Large)   Pulse 63   Temp (!) 97.4 F (36.3 C) (Temporal)   Ht 5\' 7"  (1.702 m)   Wt 268 lb 6.4 oz (121.7 kg)   LMP 10/17/2016 (Exact Date)   SpO2 98%   BMI 42.04 kg/m     Assessment & Plan:   Problem List Items Addressed This Visit      Cardiovascular and Mediastinum   Essential hypertension, benign - Primary   Relevant Orders   Urinalysis Dipstick   Comprehensive metabolic panel     Other   Severe obesity (BMI >= 40) (HCC)    Other Visit Diagnoses    Need for hepatitis C screening test       Relevant Orders   Hepatitis C Antibody    1. Essential hypertension, benign BP (!) 176/84   Pulse 63   Temp (!) 97.4 F (36.3 C) (Temporal)   Ht 5\' 7"  (1.702 m)   Wt 268 lb 6.4 oz (121.7 kg)   LMP 10/17/2016 (Exact Date)   SpO2 98%   BMI 42.04 kg/m  Blood pressure elevated, medication not taken prior to arrival  - Urinalysis Dipstick - Comprehensive metabolic panel  2. Severe obesity (BMI >= 40) (HCC) The patient is asked to make an attempt to  improve diet and exercise patterns to aid in medical management of this problem.   3. Need for hepatitis C screening test - Hepatitis C Antibody     Follow-up: Return in about 1 month (around 07/24/2020) for Pap Smear.   [name]  APRN, MSN, FNP-C [facility] [address] [place], [place] [id number] [place]

## 2020-07-03 ENCOUNTER — Other Ambulatory Visit: Payer: Self-pay | Admitting: Family Medicine

## 2020-07-21 ENCOUNTER — Encounter: Payer: Self-pay | Admitting: Family Medicine

## 2020-08-04 ENCOUNTER — Other Ambulatory Visit: Payer: Self-pay | Admitting: Family Medicine

## 2020-08-04 DIAGNOSIS — G629 Polyneuropathy, unspecified: Secondary | ICD-10-CM

## 2020-08-11 ENCOUNTER — Other Ambulatory Visit: Payer: Self-pay

## 2020-08-11 ENCOUNTER — Ambulatory Visit (INDEPENDENT_AMBULATORY_CARE_PROVIDER_SITE_OTHER): Payer: Self-pay | Admitting: Family Medicine

## 2020-08-11 ENCOUNTER — Encounter: Payer: Self-pay | Admitting: Family Medicine

## 2020-08-11 VITALS — BP 179/93 | HR 65 | Temp 97.0°F | Ht 67.0 in | Wt 272.0 lb

## 2020-08-11 DIAGNOSIS — Z01419 Encounter for gynecological examination (general) (routine) without abnormal findings: Secondary | ICD-10-CM

## 2020-08-11 DIAGNOSIS — I10 Essential (primary) hypertension: Secondary | ICD-10-CM

## 2020-08-11 DIAGNOSIS — Z0000 Encounter for general adult medical examination without abnormal findings: Secondary | ICD-10-CM

## 2020-08-11 LAB — POCT URINALYSIS DIPSTICK
Bilirubin, UA: NEGATIVE
Blood, UA: NEGATIVE
Glucose, UA: NEGATIVE
Ketones, UA: NEGATIVE
Leukocytes, UA: NEGATIVE
Nitrite, UA: NEGATIVE
Protein, UA: POSITIVE — AB
Spec Grav, UA: >=1.03 — AB (ref 1.010–1.025)
Urobilinogen, UA: 1 E.U./dL
pH, UA: 5.5 (ref 5.0–8.0)

## 2020-08-11 MED ORDER — TRIAMTERENE-HCTZ 37.5-25 MG PO TABS: 1.0000 | ORAL_TABLET | Freq: Every day | ORAL | 0 refills | Status: DC

## 2020-08-11 MED ORDER — TRIAMTERENE-HCTZ 37.5-25 MG PO TABS: 1.0000 | ORAL_TABLET | Freq: Every day | ORAL | 6 refills | Status: DC

## 2020-08-11 NOTE — Progress Notes (Signed)
Patient [facility]    Subjective:  Abigail Beasley is a 50 y.o. female with a medical history significant for essential hypertension and obesity that presents for an annual wellness exam with Pap smear. Patient says that she is doing well and is without complaint on today.  She says that she typically takes all prescribed medications consistently, but has been out of blood pressure medication over the past 4 days.  She has not been exercising or following a low-fat, low carbohydrate diet. Patient states that it is been greater than 5 years since her last Pap smear.  She says that most recent Pap smear was normal.  She has no family history of breast, cervical, or ovarian cancers.  She also does not have a family history of colon cancer. Patient has not been to the dentist in well over 1 year due to financial constraints.  She brushes daily and flosses periodically. Patient wears a seatbelt but does not wear sunscreen. She is a non-smoker.  She drinks alcohol socially. Patient is sexually active with one partner. Lab Results  Component Value Date   CHOL 189 01/03/2019   HDL 59 01/03/2019   LDLCALC 109 (H) 01/03/2019   TRIG 104 01/03/2019   CHOLHDL 3.2 01/03/2019    Lab Results  Component Value Date   HGBA1C 5.2 12/17/2019   Patient is on Vitamin D supplement.   Lab Results  Component Value Date   VD25OH 35 03/18/2013       Names of Other Physician/Practitioners you currently use:  Patient Care Team: [name], FNP as PCP - General (Family Medicine)   Medication Review: Current Outpatient Medications on File Prior to Visit  Medication Sig Dispense Refill  . gabapentin (NEURONTIN) 300 MG capsule TAKE 1 CAPSULE BY MOUTH EVERY 8 HOURS 90 capsule 0  . Multiple Vitamin (MULTIVITAMIN WITH MINERALS) TABS tablet Take 1 tablet by mouth 4 (four) times a week.    . [DISCONTINUED] omeprazole (PRILOSEC) 40 MG capsule Take 1 capsule (40 mg  total) by mouth daily. (Patient not taking: Reported on 12/17/2019) 30 capsule 2   No current facility-administered medications on file prior to visit.    Current Problems (verified) Patient Active Problem List   Diagnosis Date Noted  . Status post laparoscopic hysterectomy 11/08/2016  . Severe obesity (BMI >= 40) ([place]) 07/28/2014  . Dizziness and giddiness 03/18/2013  . Essential hypertension, benign 03/18/2013  . Breast pain, left 03/18/2013    Screening Tests Health Maintenance  Topic Date Due  . MAMMOGRAM  01/30/2019  . PAP SMEAR-Modifier  10/20/2019  . COLONOSCOPY (Pts 45-2yrs Insurance coverage will need to be confirmed)  08/11/2021 (Originally 01/29/2014)  . TETANUS/TDAP  05/30/2021  . COVID-19 Vaccine  Completed  . Hepatitis C Screening  Completed  . HIV Screening  Completed  . HPV VACCINES  Aged Out    Immunization History  Administered Date(s) Administered  . Hepatitis A, Adult 09/08/2014  . Hepatitis B, adult 09/08/2014  . Influenza Split 02/10/2015  . Influenza,inj,Quad PF,6+ Mos 03/09/2018  . Influenza-Unspecified 02/27/2014  . PFIZER(Purple Top)SARS-COV-2 Vaccination 09/07/2019, 10/01/2019, 05/08/2020  . Tdap 05/31/2011    Preventative care: Patient has not had a colonoscopy.  It is also been a number of years since last mammogram   Allergies as of 08/11/2020      Reactions   Lisinopril Cough      Medication List       Accurate as of August 11, 2020  9:49 AM. If you have any questions, ask your nurse or doctor.        STOP taking these medications   benzonatate 100 MG capsule Commonly known as: TESSALON Stopped by: Cammie Sickle, FNP     TAKE these medications   gabapentin 300 MG capsule Commonly known as: NEURONTIN TAKE 1 CAPSULE BY MOUTH EVERY 8 HOURS   multivitamin with minerals Tabs tablet Take 1 tablet by mouth 4 (four) times a week.   triamterene-hydrochlorothiazide 37.5-25 MG tablet Commonly known as: MAXZIDE-25 Take 1 tablet by  mouth daily.       Past Surgical History:  Procedure Laterality Date  . ABDOMINAL HYSTERECTOMY    . CYSTOSCOPY N/A 11/08/2016   Procedure: CYSTOSCOPY;  Surgeon: Salvadore Dom, MD;  Location: [facility];  Service: Gynecology;  Laterality: N/A;  . DILATION AND EVACUATION     miscarriage  . TOTAL LAPAROSCOPIC HYSTERECTOMY WITH SALPINGECTOMY Bilateral 11/08/2016   Procedure: HYSTERECTOMY TOTAL LAPAROSCOPIC WITH BILATERAL SALPINGECTOMY;  Surgeon: Salvadore Dom, MD;  Location: [facility];  Service: Gynecology;  Laterality: Bilateral;  . TUBAL LIGATION     Family History  Problem Relation Age of Onset  . Diabetes Mother   . Hypertension Mother   . Cancer Mother        liver   . Hyperlipidemia Mother   . Stroke Mother   . Diabetes Maternal Grandmother   . Hypertension Maternal Grandmother   . Hyperlipidemia Maternal Grandmother   . Heart disease Maternal Grandmother   . Hypertension Sister   . Cancer Maternal Grandfather        prostate  . Diabetes Maternal Grandfather   . Hyperlipidemia Maternal Grandfather   . Hypertension Maternal Grandfather   . Colon cancer Maternal Grandfather   . Diabetes Sister   . Diabetes Maternal Uncle     History reviewed: allergies, current medications, past family history, past medical history, past social history, past surgical history and problem list   Tobacco Social History   Tobacco Use  . Smoking status: Never Smoker  . Smokeless tobacco: Never Used  Vaping Use  . Vaping Use: Never used  Substance Use Topics  . Alcohol use: Yes    Alcohol/week: 3.0 standard drinks    Types: 3 Standard drinks or equivalent per week  . Drug use: Yes    Types: Marijuana    Comment: occ   She does not smoke.  Patient is not a former smoker. Alcohol Current alcohol use: social drinker  Caffeine Current caffeine use: Caffeine use varies  Exercise Current exercise: none  Nutrition/Diet Current diet: in general, an "unhealthy"  diet  Cardiac risk factors: hypertension, obesity (BMI >= 30 kg/m2) and sedentary lifestyle.  PHQ9 SCORE ONLY 12/17/2019 09/17/2019 06/18/2019  PHQ-9 Total Score 0 0 1    Objective:     Blood pressure (!) 179/93, pulse 65, temperature (!) 97 F (36.1 C), temperature source Temporal, height 5\' 7"  (1.702 m), weight 272 lb (123.4 kg), last menstrual period 10/17/2016, SpO2 99 %. Body mass index is 42.6 kg/m.  General appearance: alert, no distress, WD/WN, female Cognitive Testing  Alert? Yes  Normal Appearance?Yes  Oriented to person? Yes  Place? Yes   Time? Yes  Recall of three objects?  Yes  Can perform simple calculations? Yes  Displays appropriate judgment?Yes  Can read the correct time from a watch face?Yes  HEENT: normocephalic, sclerae anicteric, TMs pearly, nares patent, no discharge or erythema, pharynx normal Oral cavity: MMM, no lesions  Neck: supple, no lymphadenopathy, no thyromegaly, no masses Heart: RRR, normal S1, S2, no murmurs Lungs: CTA bilaterally, no wheezes, rhonchi, or rales Abdomen: +bs, soft, non tender, non distended, no masses, no hepatomegaly, no splenomegaly Musculoskeletal: nontender, no swelling, no obvious deformity Extremities: no edema, no cyanosis, no clubbing Pulses: 2+ symmetric, upper and lower extremities, normal cap refill Neurological: alert, oriented x 3, CN2-12 intact, strength normal upper extremities and lower extremities, sensation normal throughout, DTRs 2+ throughout, no cerebellar signs, gait normal Vaginal:   Psychiatric: normal affect, behavior normal, pleasant   Assessment:  Patient denies any difficulties at home. No trouble with ADLs, depression or falls. No recent changes to vision or hearing. Is UTD with immunizations. Is UTD with screening. Encouraged heart healthy diet, exercise as tolerated and adequate sleep. Declines flu shot. Pap smear done today.     Plan:   During the course of the visit the patient was educated  and counseled about appropriate screening and preventive services including:    Pneumococcal vaccine   Influenza vaccine  Td vaccine  Screening electrocardiogram  Bone densitometry screening  Colorectal cancer screening  Diabetes screening  Glaucoma screening  Nutrition counseling   Advanced directives: requested  Annual physical exam - POCT Urinalysis Dipstick - CMP and Liver - TSH - HgB A1c - Vitamin D, 25-hydroxy - Hepatitis C Antibody - Lipid Panel  Essential hypertension, benign Patient did not take BP medication prior to arrival - triamterene-hydrochlorothiazide (MAXZIDE-25) 37.5-25 MG tablet; Take 1 tablet by mouth daily.  Dispense: 30 tablet; Refill: 6  Cervical smear, as part of routine gynecological examination - Melvin  APRN, MSN, FNP-C Patient [facility] [address] [place],  [address] [phone]

## 2020-08-11 NOTE — Patient Instructions (Addendum)
BP (!) 185/95 (BP Location: Left Arm, Patient Position: Sitting, Cuff Size: Large)   Pulse 77   Temp (!) 97 F (36.1 C) (Temporal)   Ht 5\' 7"  (1.702 m)   Wt 272 lb (123.4 kg)   LMP 10/17/2016 (Exact Date)   SpO2 99%   BMI 42.60 kg/m    - Continue medication, monitor blood pressure at home. Continue DASH diet. Reminder to go to the ER if any CP, SOB, nausea, dizziness, severe HA, changes vision/speech, left arm numbness and tingling and jaw pain.   Your blood pressure medication has been sent to your pharmacy.  Meds ordered this encounter  Medications  . DISCONTD: triamterene-hydrochlorothiazide (MAXZIDE-25) 37.5-25 MG tablet    Sig: Take 1 tablet by mouth daily.    Dispense:  30 tablet    Refill:  0    Order Specific Question:   Supervising Provider    Answer:   Tresa Garter [id number]  . triamterene-hydrochlorothiazide (MAXZIDE-25) 37.5-25 MG tablet    Sig: Take 1 tablet by mouth daily.    Dispense:  30 tablet    Refill:  6    Order Specific Question:   Supervising Provider    Answer:   Tresa Garter [[id number]]     Pap Test Why am I having this test? A Pap test, also called a Pap smear, is a screening test to check for signs of:  Cancer of the vagina, cervix, and uterus. The cervix is the lower part of the uterus that opens into the vagina.  Infection.  Changes that may be a sign that cancer is developing (precancerous changes). Women need this test on a regular basis. In general, you should have a Pap test every 3 years until you reach menopause or age 11. Women aged 30-60 may choose to have their Pap test done at the same time as an HPV (human papillomavirus) test every 5 years (instead of every 3 years). Your health care provider may recommend having Pap tests more or less often depending on your medical conditions and past Pap test results. What kind of sample is taken? Your health care provider will collect a sample of cells from the surface of your  cervix. This will be done using a small cotton swab, plastic spatula, or brush. This sample is often collected during a pelvic exam, when you are lying on your back on an exam table with feet in footrests (stirrups). In some cases, fluids (secretions) from the cervix or vagina may also be collected.   How do I prepare for this test?  Be aware of where you are in your menstrual cycle. If you are menstruating on the day of the test, you may be asked to reschedule.  You may need to reschedule if you have a known vaginal infection on the day of the test.  Follow instructions from your health care provider about: ? Changing or stopping your regular medicines. Some medicines can cause abnormal test results, such as digitalis and tetracycline. ? Avoiding douching or taking a bath the day before or the day of the test. Tell a health care provider about:  Any allergies you have.  All medicines you are taking, including vitamins, herbs, eye drops, creams, and over-the-counter medicines.  Any blood disorders you have.  Any surgeries you have had.  Any medical conditions you have.  Whether you are pregnant or may be pregnant. How are the results reported? Your test results will be reported as either abnormal or normal.  A false-positive result can occur. A false positive is incorrect because it means that a condition is present when it is not. A false-negative result can occur. A false negative is incorrect because it means that a condition is not present when it is. What do the results mean? A normal test result means that you do not have signs of cancer of the vagina, cervix, or uterus. An abnormal result may mean that you have:  Cancer. A Pap test by itself is not enough to diagnose cancer. You will have more tests done in this case.  Precancerous changes in your vagina, cervix, or uterus.  Inflammation of the cervix.  An STD (sexually transmitted disease).  A fungal infection.  A  parasite infection. Talk with your health care provider about what your results mean. Questions to ask your health care provider Ask your health care provider, or the department that is doing the test:  When will my results be ready?  How will I get my results?  What are my treatment options?  What other tests do I need?  What are my next steps? Summary  In general, women should have a Pap test every 3 years until they reach menopause or age 50.  Your health care provider will collect a sample of cells from the surface of your cervix. This will be done using a small cotton swab, plastic spatula, or brush.  In some cases, fluids (secretions) from the cervix or vagina may also be collected. This information is not intended to replace advice given to you by your health care provider. Make sure you discuss any questions you have with your health care provider. Document Revised: 01/22/2020 Document Reviewed: 01/17/2020 Elsevier Patient Education  [place].

## 2020-08-12 LAB — VITAMIN D 25 HYDROXY (VIT D DEFICIENCY, FRACTURES): Vit D, 25-Hydroxy: 20.1 ng/mL — ABNORMAL LOW (ref 30.0–100.0)

## 2020-08-12 LAB — CMP AND LIVER
ALT: 15 IU/L (ref 0–32)
AST: 19 IU/L (ref 0–40)
Albumin: 4 g/dL (ref 3.8–4.9)
Alkaline Phosphatase: 61 IU/L (ref 44–121)
BUN: 15 mg/dL (ref 6–24)
Bilirubin Total: 0.4 mg/dL (ref 0.0–1.2)
Bilirubin, Direct: 0.13 mg/dL (ref 0.00–0.40)
CO2: 22 mmol/L (ref 20–29)
Calcium: 9.1 mg/dL (ref 8.7–10.2)
Chloride: 105 mmol/L (ref 96–106)
Creatinine, Ser: 1.16 mg/dL — ABNORMAL HIGH (ref 0.57–1.00)
Glucose: 87 mg/dL (ref 65–99)
Potassium: 4.7 mmol/L (ref 3.5–5.2)
Sodium: 141 mmol/L (ref 134–144)
Total Protein: 6.6 g/dL (ref 6.0–8.5)
eGFR: 57 mL/min/{1.73_m2} — ABNORMAL LOW (ref >59)

## 2020-08-12 LAB — LIPID PANEL
Chol/HDL Ratio: 2.8 ratio (ref 0.0–4.4)
Cholesterol, Total: 170 mg/dL (ref 100–199)
HDL: 61 mg/dL (ref >39)
LDL Chol Calc (NIH): 94 mg/dL (ref 0–99)
Triglycerides: 83 mg/dL (ref 0–149)
VLDL Cholesterol Cal: 15 mg/dL (ref 5–40)

## 2020-08-12 LAB — HEPATITIS C ANTIBODY: Hep C Virus Ab: <0.1 s/co ratio (ref 0.0–0.9)

## 2020-08-12 LAB — TSH: TSH: 1.4 u[IU]/mL (ref 0.450–4.500)

## 2020-08-15 ENCOUNTER — Telehealth: Payer: Self-pay | Admitting: Family Medicine

## 2020-08-15 DIAGNOSIS — A5901 Trichomonal vulvovaginitis: Secondary | ICD-10-CM

## 2020-08-15 LAB — IGP,CTNGTV,APT HPV,RFX16/18,45
Chlamydia, Nuc. Acid Amp: NEGATIVE
Gonococcus, Nuc. Acid Amp: NEGATIVE
HPV Aptima: NEGATIVE
Trich vag by NAA: POSITIVE — AB

## 2020-08-15 MED ORDER — METRONIDAZOLE 500 MG PO TABS: 500.0000 mg | ORAL_TABLET | Freq: Two times a day (BID) | ORAL | 0 refills | Status: DC

## 2020-08-15 NOTE — Telephone Encounter (Signed)
Abigail Beasley underwent a complete physical exam with Pap smear on 08/11/2020. Patient's vaginal sample was found to be colonized with trichomonas vaginitis.  Notify patient, discussed disease at length.  We will start treatment with metronidazole 500 mg twice daily for total of 7 days. Also, reviewed all of the laboratory values.  Creatinine remains slightly elevated.  However, levels are stable and consistent with baseline.  Discussed the importance of maintaining tight control of blood pressure.  Also, the importance of taking medications consistently in order to achieve positive outcomes.- Continue medication, monitor blood pressure at home. Continue DASH diet. Reminder to go to the ER if any CP, SOB, nausea, dizziness, severe HA, changes vision/speech, left arm numbness and tingling and jaw pain.   All of the laboratory values are largely within normal limits.  Pap smear negative.  Will repeat in 3 years.  [name]  APRN, MSN, FNP-C Patient [facility] [address] [place], [place] [address] [phone]

## 2020-10-22 ENCOUNTER — Other Ambulatory Visit: Payer: Self-pay | Admitting: Family Medicine

## 2020-10-22 DIAGNOSIS — G629 Polyneuropathy, unspecified: Secondary | ICD-10-CM

## 2020-11-17 ENCOUNTER — Ambulatory Visit: Payer: Self-pay | Admitting: Family Medicine

## 2020-12-29 ENCOUNTER — Ambulatory Visit: Payer: Self-pay | Admitting: Family Medicine

## 2020-12-29 ENCOUNTER — Telehealth: Payer: Self-pay

## 2020-12-29 DIAGNOSIS — I10 Essential (primary) hypertension: Secondary | ICD-10-CM

## 2020-12-29 DIAGNOSIS — G629 Polyneuropathy, unspecified: Secondary | ICD-10-CM

## 2020-12-29 MED ORDER — TRIAMTERENE-HCTZ 37.5-25 MG PO TABS: 1.0000 | ORAL_TABLET | Freq: Every day | ORAL | 6 refills | Status: DC

## 2020-12-29 MED ORDER — GABAPENTIN 300 MG PO CAPS: 300.0000 mg | ORAL_CAPSULE | Freq: Three times a day (TID) | ORAL | 0 refills | Status: DC

## 2020-12-29 NOTE — Telephone Encounter (Signed)
Refills sent

## 2020-12-29 NOTE — Telephone Encounter (Signed)
BP refill  Dapapin

## 2021-01-26 ENCOUNTER — Encounter: Payer: Self-pay | Admitting: Family Medicine

## 2021-01-26 ENCOUNTER — Ambulatory Visit (INDEPENDENT_AMBULATORY_CARE_PROVIDER_SITE_OTHER): Payer: Self-pay | Admitting: Family Medicine

## 2021-01-26 ENCOUNTER — Other Ambulatory Visit: Payer: Self-pay

## 2021-01-26 VITALS — BP 162/95 | HR 66 | Temp 97.3°F | Ht 67.0 in | Wt 276.6 lb

## 2021-01-26 DIAGNOSIS — R739 Hyperglycemia, unspecified: Secondary | ICD-10-CM

## 2021-01-26 DIAGNOSIS — I10 Essential (primary) hypertension: Secondary | ICD-10-CM

## 2021-01-26 DIAGNOSIS — R109 Unspecified abdominal pain: Secondary | ICD-10-CM

## 2021-01-26 DIAGNOSIS — G629 Polyneuropathy, unspecified: Secondary | ICD-10-CM

## 2021-01-26 DIAGNOSIS — E559 Vitamin D deficiency, unspecified: Secondary | ICD-10-CM

## 2021-01-26 DIAGNOSIS — R5383 Other fatigue: Secondary | ICD-10-CM

## 2021-01-26 LAB — POCT URINALYSIS DIP (CLINITEK)
Bilirubin, UA: NEGATIVE
Blood, UA: NEGATIVE
Glucose, UA: NEGATIVE mg/dL
Ketones, POC UA: NEGATIVE mg/dL
Leukocytes, UA: NEGATIVE
Nitrite, UA: NEGATIVE
POC PROTEIN,UA: 30 — AB
Spec Grav, UA: 1.025 (ref 1.010–1.025)
Urobilinogen, UA: 0.2 E.U./dL
pH, UA: 6 (ref 5.0–8.0)

## 2021-01-26 LAB — POCT GLYCOSYLATED HEMOGLOBIN (HGB A1C)
HbA1c POC (<> result, manual entry): 5.5 % (ref 4.0–5.6)
HbA1c, POC (controlled diabetic range): 5.5 % (ref 0.0–7.0)
HbA1c, POC (prediabetic range): 5.5 % — AB (ref 5.7–6.4)
Hemoglobin A1C: 5.5 % (ref 4.0–5.6)

## 2021-01-26 MED ORDER — TRIAMTERENE-HCTZ 37.5-25 MG PO TABS: 1.0000 | ORAL_TABLET | Freq: Every day | ORAL | 3 refills | Status: DC

## 2021-01-26 MED ORDER — GABAPENTIN 300 MG PO CAPS: 300.0000 mg | ORAL_CAPSULE | Freq: Three times a day (TID) | ORAL | 3 refills | Status: DC

## 2021-01-26 NOTE — Patient Instructions (Signed)
Simethicone Capsules or Tablets What is this medication? SIMETHICONE (sye METH i kone) treats the symptoms of gas, such as fullness, pressure, and bloating. It works by making it easier to pass gas through yourdigestive tract and exit your body. This medicine may be used for other purposes; ask your health care provider orpharmacist if you have questions. COMMON BRAND NAME(S): Gas Free, Gas Relief, Gas-X, Gas-X Extra Strength, Gas-XUltra Strength, GasAid, Mylanta Gas, Phazyme What should I tell my care team before I take this medication? They need to know if you have any of these conditions: An unusual or allergic reaction to simethicone, other medications, foods, dyes, or preservatives Pregnant or trying to get pregnant Breast-feeding How should I use this medication? Take this medication by mouth with a glass of water. Follow the directions on the label or those given to you by your care team. Do not take your medicationmore often than directed. Talk to your care team about the use of this medication in children. Special care may be needed. While this medication may be used in children as young as12 years for selected conditions, precautions do apply. Overdosage: If you think you have taken too much of this medicine contact apoison control center or emergency room at once. NOTE: This medicine is only for you. Do not share this medicine with others. What if I miss a dose? This does not apply. You will only use this medication as needed for gas pain.Do not use double or extra doses. What may interact with this medication? Interactions are not expected. This list may not describe all possible interactions. Give your health care provider a list of all the medicines, herbs, non-prescription drugs, or dietary supplements you use. Also tell them if you smoke, drink alcohol, or use illegaldrugs. Some items may interact with your medicine. What should I watch for while using this medication? Tell your  care team if your symptoms get worse, or if you have severe pain, diarrhea, constipation, or blood in your stool. These could be signs of a moreserious condition. What side effects may I notice from receiving this medication? Side effects that you should report to your care team as soon as possible: Allergic reactions-skin rash, itching, hives, swelling of the face, lips, tongue, or throat This list may not describe all possible side effects. Call your doctor for medical advice about side effects. You may report side effects to FDA at1-800-FDA-1088. Where should I keep my medication? Keep out of the reach of children. Store at room temperature between 15 and 30 degrees C (59 and 86 degrees F). Keep container tightly closed. Throw away any unused medication after theexpiration date. NOTE: This sheet is a summary. It may not cover all possible information. If you have questions about this medicine, talk to your doctor, pharmacist, orhealth care provider.  2022 Elsevier/Gold Standard (2020-07-06 14:11:49) Abdominal Bloating When you have abdominal bloating, your abdomen may feel full, tight, or painful. It may also look bigger than normal or swollen (distended). Common causes of abdominal bloating include: Swallowing air. Constipation. Problems digesting food. Eating too much. Irritable bowel syndrome. This is a condition that affects the large intestine. Lactose intolerance. This is an inability to digest lactose, a natural sugar in dairy products. Celiac disease. This is a condition that affects the ability to digest gluten, a protein found in some grains. Gastroparesis. This is a condition that slows down the movement of food in the stomach and small intestine. It is more common in people with diabetes mellitus.  Gastroesophageal reflux disease (GERD). This is a digestive condition that makes stomach acid flow back into the esophagus. Urinary retention. This means that the body is holding onto  urine, and the bladder cannot be emptied all the way. Follow these instructions at home: Eating and drinking Avoid eating too much. Try not to swallow air while talking or eating. Avoid eating while lying down. Avoid these foods and drinks: Foods that cause gas, such as broccoli, cabbage, cauliflower, and baked beans. Carbonated drinks. Hard candy. Chewing gum. Medicines Take over-the-counter and prescription medicines only as told by your health care provider. Take probiotic medicines. These medicines contain live bacteria or yeasts that can help digestion. Take coated peppermint oil capsules. Activity Try to exercise regularly. Exercise may help to relieve bloating that is caused by gas and relieve constipation. General instructions Keep all follow-up visits as told by your health care provider. This is important. Contact a health care provider if: You have nausea and vomiting. You have diarrhea. You have abdominal pain. You have unusual weight loss or weight gain. You have severe pain, and medicines do not help. Get help right away if: You have severe chest pain. You have trouble breathing. You have shortness of breath. You have trouble urinating. You have darker urine than normal. You have blood in your stools or have dark, tarry stools. Summary Abdominal bloating means that the abdomen is swollen. Common causes of abdominal bloating are swallowing air, constipation, and problems digesting food. Avoid eating too much and avoid swallowing air. Avoid foods that cause gas, carbonated drinks, hard candy, and chewing gum. This information is not intended to replace advice given to you by your health care provider. Make sure you discuss any questions you have with your healthcare provider. Document Revised: 09/03/2018 Document Reviewed: 06/17/2016 Elsevier Patient Education  2021 [employer].

## 2021-01-26 NOTE — Progress Notes (Signed)
Established Patient Office Visit  Subjective:  Patient ID: Abigail Beasley, female    DOB: 05/20/1971  Age: 50 y.o. MRN: [id number]  CC:  Chief Complaint  Patient presents with   Follow-up    Pt is here today for her Hypertension follow up visit. Pt also states that she has been having some abdominal pains x 2 days. Pt states it is a consistent pain.    HPI  Abigail Beasley is a very pleasant 50 year old female with a medical history significant for hypertension, chronic kidney disease, and obesity presents for follow-up of chronic conditions.  Patient is also complaining intermittently left-sided abdominal pain over the past several weeks.  Abdominal Pain This is a new problem. The current episode started 1 to 4 weeks ago. The problem occurs intermittently. The pain is located in the LLQ and LUQ. The quality of the pain is aching. Associated symptoms include flatus. Pertinent negatives include no belching, constipation, diarrhea, dysuria, fever, headaches, hematuria, nausea or vomiting. The treatment provided no relief. There is no history of colon cancer or irritable bowel syndrome.  Hypertension Pertinent negatives include no anxiety, headaches, palpitations, peripheral edema, shortness of breath or sweats. There are no associated agents to hypertension. Risk factors for coronary artery disease include obesity and sedentary lifestyle. Compliance problems include exercise.    Past Medical History:  Diagnosis Date   Allergy    Elevated serum creatinine 01/2019   History of proteinuria syndrome 01/2019   Hypertension    Left flank pain 01/2019   PONV (postoperative nausea and vomiting)     Past Surgical History:  Procedure Laterality Date   ABDOMINAL HYSTERECTOMY     CYSTOSCOPY N/A 11/08/2016   Procedure: CYSTOSCOPY;  Surgeon: Salvadore Dom, MD;  Location: [facility];  Service: Gynecology;  Laterality: N/A;   DILATION AND EVACUATION     miscarriage   TOTAL LAPAROSCOPIC HYSTERECTOMY  WITH SALPINGECTOMY Bilateral 11/08/2016   Procedure: HYSTERECTOMY TOTAL LAPAROSCOPIC WITH BILATERAL SALPINGECTOMY;  Surgeon: Salvadore Dom, MD;  Location: [facility];  Service: Gynecology;  Laterality: Bilateral;   TUBAL LIGATION      Family History  Problem Relation Age of Onset   Diabetes Mother    Hypertension Mother    Cancer Mother        liver    Hyperlipidemia Mother    Stroke Mother    Diabetes Maternal Grandmother    Hypertension Maternal Grandmother    Hyperlipidemia Maternal Grandmother    Heart disease Maternal Grandmother    Hypertension Sister    Cancer Maternal Grandfather        prostate   Diabetes Maternal Grandfather    Hyperlipidemia Maternal Grandfather    Hypertension Maternal Grandfather    Colon cancer Maternal Grandfather    Diabetes Sister    Diabetes Maternal Uncle     Social History   Socioeconomic History   Marital status: Single    Spouse name: Not on file   Number of children: Not on file   Years of education: Not on file   Highest education level: Not on file  Occupational History   Not on file  Tobacco Use   Smoking status: Never   Smokeless tobacco: Never  Vaping Use   Vaping Use: Never used  Substance and Sexual Activity   Alcohol use: Yes    Alcohol/week: 3.0 standard drinks    Types: 3 Standard drinks or equivalent per week   Drug use: Not Currently    Types: Marijuana  Comment: occ   Sexual activity: Not Currently    Partners: Male    Birth control/protection: Surgical  Other Topics Concern   Not on file  Social History Narrative   Marital status: single; not dating.      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      Tobacco: none      Alcohol:  Socially; rarely.      Drugs: none; marijuana in the past.      Exercise: none      History of incarceration for six years (guilt by association; friend robbed bank).      Sexual  activity:  Males; last STD Gonorrhea at age 55.  Syphilis in twenties.    Social Determinants of Health   Financial Resource Strain: Not on file  Food Insecurity: Not on file  Transportation Needs: Not on file  Physical Activity: Not on file  Stress: Not on file  Social Connections: Not on file  Intimate Partner Violence: Not on file    Outpatient Medications Prior to Visit  Medication Sig Dispense Refill   gabapentin (NEURONTIN) 300 MG capsule Take 1 capsule (300 mg total) by mouth every 8 (eight) hours. 90 capsule 0   Multiple Vitamin (MULTIVITAMIN WITH MINERALS) TABS tablet Take 1 tablet by mouth 4 (four) times a week.     triamterene-hydrochlorothiazide (MAXZIDE-25) 37.5-25 MG tablet Take 1 tablet by mouth daily. 30 tablet 6   metroNIDAZOLE (FLAGYL) 500 MG tablet Take 1 tablet (500 mg total) by mouth 2 (two) times daily. (Patient not taking: Reported on 01/26/2021) 14 tablet 0   No facility-administered medications prior to visit.    Allergies  Allergen Reactions   Lisinopril Cough    ROS Review of Systems  Constitutional:  Negative for fever.  Eyes: Negative.   Respiratory:  Negative for shortness of breath.   Cardiovascular:  Negative for palpitations.  Gastrointestinal:  Positive for abdominal pain and flatus. Negative for constipation, diarrhea, nausea and vomiting.  Endocrine: Negative.   Genitourinary:  Negative for dysuria and hematuria.  Musculoskeletal: Negative.   Neurological: Negative.  Negative for headaches.  Psychiatric/Behavioral: Negative.       Objective:    Physical Exam Constitutional:      Appearance: She is obese.  Eyes:     Pupils: Pupils are equal, round, and reactive to light.  Cardiovascular:     Rate and Rhythm: Normal rate and regular rhythm.     Pulses: Normal pulses.  Pulmonary:     Effort: Pulmonary effort is normal.  Abdominal:     General: Bowel sounds are normal.     Tenderness: There is abdominal tenderness in the left  upper quadrant and left lower quadrant. There is no guarding.     Hernia: A hernia is present. Hernia is present in the umbilical area.  Neurological:     General: No focal deficit present.     Mental Status: She is alert. Mental status is at baseline.  Psychiatric:        Mood and Affect: Mood normal.        Behavior: Behavior normal.        Thought Content: Thought content normal.        Judgment: Judgment normal.    BP (!) 162/95   Pulse 66   Temp (!) 97.3 F (36.3 C)   Ht $R'5\' 7"'gU$  (  1.702 m)   Wt 276 lb 9.6 oz (125.5 kg)   LMP 10/17/2016 (Exact Date)   SpO2 99%   BMI 43.32 kg/m  Wt Readings from Last 3 Encounters:  01/26/21 276 lb 9.6 oz (125.5 kg)  08/11/20 272 lb (123.4 kg)  06/23/20 268 lb 6.4 oz (121.7 kg)     Health Maintenance Due  Topic Date Due   MAMMOGRAM  01/30/2019   Zoster Vaccines- Shingrix (1 of 2) Never done   COVID-19 Vaccine (4 - Booster for Pfizer series) 09/06/2020    There are no preventive care reminders to display for this patient.  Lab Results  Component Value Date   TSH 1.400 08/11/2020   Lab Results  Component Value Date   WBC 6.7 05/06/2020   HGB 13.9 05/06/2020   HCT 43.6 05/06/2020   MCV 95.2 05/06/2020   PLT 267 05/06/2020   Lab Results  Component Value Date   NA 141 08/11/2020   K 4.7 08/11/2020   CO2 22 08/11/2020   GLUCOSE 87 08/11/2020   BUN 15 08/11/2020   CREATININE 1.16 (H) 08/11/2020   BILITOT 0.4 08/11/2020   ALKPHOS 61 08/11/2020   AST 19 08/11/2020   ALT 15 08/11/2020   PROT 6.6 08/11/2020   ALBUMIN 4.0 08/11/2020   CALCIUM 9.1 08/11/2020   ANIONGAP 9 05/06/2020   EGFR 57 (L) 08/11/2020   Lab Results  Component Value Date   CHOL 170 08/11/2020   Lab Results  Component Value Date   HDL 61 08/11/2020   Lab Results  Component Value Date   LDLCALC 94 08/11/2020   Lab Results  Component Value Date   TRIG 83 08/11/2020   Lab Results  Component Value Date   CHOLHDL 2.8 08/11/2020   Lab Results   Component Value Date   HGBA1C 5.2 12/17/2019      Assessment & Plan:   Problem List Items Addressed This Visit       Cardiovascular and Mediastinum   Essential hypertension, benign - Primary   Relevant Orders   POCT URINALYSIS DIP (CLINITEK) (Completed)   Other Visit Diagnoses     Left sided abdominal pain       Relevant Orders   US Abdomen Complete      1. Essential hypertension, benign - Continue medication, monitor blood pressure at home. Continue DASH diet.  Reminder to go to the ER if any CP, SOB, nausea, dizziness, severe HA, changes vision/speech, left arm numbness and tingling and jaw pain.   - POCT URINALYSIS DIP (CLINITEK) - triamterene-hydrochlorothiazide (MAXZIDE-25) 37.5-25 MG tablet; Take 1 tablet by mouth daily.  Dispense: 90 tablet; Refill: 3  2. Left sided abdominal pain - US Abdomen Complete; Future  3. Neuropathy  - gabapentin (NEURONTIN) 300 MG capsule; Take 1 capsule (300 mg total) by mouth every 8 (eight) hours.  Dispense: 90 capsule; Refill: 3 - Vitamin B12  4. Other fatigue  - TSH - CMP and Liver - CBC with Differential - Glucose (CBG) - HgB A1c  5. Hyperglycemia - Glucose (CBG) - HgB A1c  6. Vitamin D deficiency  - Vitamin D, 25-hydroxy    Follow-up: 3 months for chronic conditions   Jayme Mednick Al Decant  APRN, MSN, FNP-C Patient [facility] [address] [place], [place] [address] [phone]

## 2021-01-27 LAB — CMP AND LIVER
ALT: 15 IU/L (ref 0–32)
AST: 13 IU/L (ref 0–40)
Albumin: 4.2 g/dL (ref 3.8–4.9)
Alkaline Phosphatase: 70 IU/L (ref 44–121)
BUN: 17 mg/dL (ref 6–24)
Bilirubin Total: 0.3 mg/dL (ref 0.0–1.2)
Bilirubin, Direct: <0.1 mg/dL (ref 0.00–0.40)
CO2: 23 mmol/L (ref 20–29)
Calcium: 9.6 mg/dL (ref 8.7–10.2)
Chloride: 102 mmol/L (ref 96–106)
Creatinine, Ser: 1.14 mg/dL — ABNORMAL HIGH (ref 0.57–1.00)
Glucose: 90 mg/dL (ref 65–99)
Potassium: 4.2 mmol/L (ref 3.5–5.2)
Sodium: 139 mmol/L (ref 134–144)
Total Protein: 6.6 g/dL (ref 6.0–8.5)
eGFR: 58 mL/min/{1.73_m2} — ABNORMAL LOW (ref >59)

## 2021-01-27 LAB — CBC WITH DIFFERENTIAL/PLATELET
Basophils Absolute: 0 10*3/uL (ref 0.0–0.2)
Basos: 1 %
EOS (ABSOLUTE): 0.2 10*3/uL (ref 0.0–0.4)
Eos: 4 %
Hematocrit: 42.1 % (ref 34.0–46.6)
Hemoglobin: 14.5 g/dL (ref 11.1–15.9)
Immature Grans (Abs): 0 10*3/uL (ref 0.0–0.1)
Immature Granulocytes: 0 %
Lymphocytes Absolute: 1.3 10*3/uL (ref 0.7–3.1)
Lymphs: 23 %
MCH: 30.3 pg (ref 26.6–33.0)
MCHC: 34.4 g/dL (ref 31.5–35.7)
MCV: 88 fL (ref 79–97)
Monocytes Absolute: 0.5 10*3/uL (ref 0.1–0.9)
Monocytes: 8 %
Neutrophils Absolute: 3.5 10*3/uL (ref 1.4–7.0)
Neutrophils: 64 %
Platelets: 271 10*3/uL (ref 150–450)
RBC: 4.78 x10E6/uL (ref 3.77–5.28)
RDW: 12.1 % (ref 11.7–15.4)
WBC: 5.5 10*3/uL (ref 3.4–10.8)

## 2021-01-27 LAB — VITAMIN D 25 HYDROXY (VIT D DEFICIENCY, FRACTURES): Vit D, 25-Hydroxy: 10.9 ng/mL — ABNORMAL LOW (ref 30.0–100.0)

## 2021-01-27 LAB — VITAMIN B12: Vitamin B-12: 579 pg/mL (ref 232–1245)

## 2021-01-27 LAB — TSH: TSH: 1.91 u[IU]/mL (ref 0.450–4.500)

## 2021-01-28 ENCOUNTER — Telehealth: Payer: Self-pay | Admitting: Family Medicine

## 2021-01-28 DIAGNOSIS — E559 Vitamin D deficiency, unspecified: Secondary | ICD-10-CM

## 2021-01-28 MED ORDER — ERGOCALCIFEROL 1.25 MG (50000 UT) PO CAPS: 50000.0000 [IU] | ORAL_CAPSULE | ORAL | 1 refills | Status: DC

## 2021-01-28 NOTE — Telephone Encounter (Signed)
Please inform patient that vitamin D level is markedly decreased.  We will start Drisdol 50,000 IUs weekly.  We will recheck vitamin D levels in 3 months.  Recommend foods that are vitamin D fortified such as milk, yogurt, micro, salmon, etc.  All other laboratory values are largely consistent with patient's baseline.  Recommend that patient continues to avoid nephrotoxins due to chronic kidney disease such as ibuprofen and naproxen.  Recommend Tylenol 500 mg every 4 hours as needed for mild to moderate pain, headache, and/or fever.  Will follow-up in 3 months.  [name]  APRN, MSN, FNP-C Patient [facility] [address] [place],  [address] [phone]

## 2021-01-28 NOTE — Telephone Encounter (Signed)
Patient notified of results, verbally understood. No questions or concerns.

## 2021-02-17 ENCOUNTER — Other Ambulatory Visit: Payer: Self-pay

## 2021-02-17 ENCOUNTER — Ambulatory Visit (HOSPITAL_COMMUNITY)
Admission: RE | Admit: 2021-02-17 | Discharge: 2021-02-17 | Disposition: A | Discharge status: Home / Self Care | Payer: Self-pay | Source: Ambulatory Visit | Attending: Family Medicine | Admitting: Family Medicine

## 2021-02-17 ENCOUNTER — Telehealth: Payer: Self-pay | Admitting: Family Medicine

## 2021-02-17 DIAGNOSIS — R109 Unspecified abdominal pain: Secondary | ICD-10-CM | POA: Insufficient documentation

## 2021-02-17 NOTE — Telephone Encounter (Signed)
Lael Wetherbee is a 50 year old female with a medical history significant for hypertension presented with complaints of abdominal pain over the past month.  Patient characterizes pain as intermittent and aching.  She has not identified any palliative or provocative factors concerning abdominal pain.  Patient underwent an abdominal ultrasound which showed benign hepatic hemangioma that is 1.5 cm, the ultrasound was otherwise negative.  Notify patient.  Discussed at length.  No further imaging warranted at this juncture.  Patient advised to follow-up as scheduled.   [name]  APRN, MSN, FNP-C Patient [facility] [address] [place], [place] [address] [phone]

## 2021-05-04 ENCOUNTER — Encounter: Payer: Self-pay | Admitting: Family Medicine

## 2021-05-04 ENCOUNTER — Other Ambulatory Visit: Payer: Self-pay

## 2021-05-04 ENCOUNTER — Ambulatory Visit (INDEPENDENT_AMBULATORY_CARE_PROVIDER_SITE_OTHER): Payer: Self-pay | Admitting: Family Medicine

## 2021-05-04 VITALS — BP 154/84 | HR 86 | Temp 97.2°F | Ht 67.0 in | Wt 277.6 lb

## 2021-05-04 DIAGNOSIS — N182 Chronic kidney disease, stage 2 (mild): Secondary | ICD-10-CM

## 2021-05-04 DIAGNOSIS — E559 Vitamin D deficiency, unspecified: Secondary | ICD-10-CM

## 2021-05-04 DIAGNOSIS — Z9109 Other allergy status, other than to drugs and biological substances: Secondary | ICD-10-CM

## 2021-05-04 DIAGNOSIS — I10 Essential (primary) hypertension: Secondary | ICD-10-CM

## 2021-05-04 MED ORDER — LORATADINE 10 MG PO TABS: 10.0000 mg | ORAL_TABLET | Freq: Every day | ORAL | 11 refills | Status: DC

## 2021-05-04 MED ORDER — TRIAMTERENE-HCTZ 37.5-25 MG PO TABS: 1.0000 | ORAL_TABLET | Freq: Every day | ORAL | 3 refills | Status: DC
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
## 2021-05-04 NOTE — Patient Instructions (Addendum)
Goal weight is 254- Decrease weight by 23 pounds.   The patient is asked to make an attempt to improve diet and exercise patterns to aid in medical management of this problem.   BP (!) 148/84   Pulse 72   Temp (!) 97.2 F (36.2 C)   Ht 5\' 7"  (1.702 m)   Wt 277 lb 9.6 oz (125.9 kg)   LMP 10/17/2016 (Exact Date)   SpO2 96%   BMI 43.48 kg/m   Continue medication, monitor blood pressure at home. Continue DASH diet.  Reminder to go to the ER if any CP, SOB, nausea, dizziness, severe HA, changes vision/speech, left arm numbness and tingling and jaw pain.

## 2021-05-05 LAB — VITAMIN D 25 HYDROXY (VIT D DEFICIENCY, FRACTURES): Vit D, 25-Hydroxy: 38.3 ng/mL (ref 30.0–100.0)

## 2021-05-05 LAB — BASIC METABOLIC PANEL
BUN/Creatinine Ratio: 13 (ref 9–23)
BUN: 16 mg/dL (ref 6–24)
CO2: 23 mmol/L (ref 20–29)
Calcium: 9.5 mg/dL (ref 8.7–10.2)
Chloride: 104 mmol/L (ref 96–106)
Creatinine, Ser: 1.22 mg/dL — ABNORMAL HIGH (ref 0.57–1.00)
Glucose: 88 mg/dL (ref 70–99)
Potassium: 4.3 mmol/L (ref 3.5–5.2)
Sodium: 141 mmol/L (ref 134–144)
eGFR: 53 mL/min/{1.73_m2} — ABNORMAL LOW (ref >59)

## 2021-05-07 ENCOUNTER — Other Ambulatory Visit: Payer: Self-pay

## 2021-05-07 ENCOUNTER — Encounter (HOSPITAL_BASED_OUTPATIENT_CLINIC_OR_DEPARTMENT_OTHER): Payer: Self-pay | Admitting: Emergency Medicine

## 2021-05-07 ENCOUNTER — Emergency Department (HOSPITAL_BASED_OUTPATIENT_CLINIC_OR_DEPARTMENT_OTHER): Payer: Self-pay | Admitting: Radiology

## 2021-05-07 ENCOUNTER — Encounter: Payer: Self-pay | Admitting: Family Medicine

## 2021-05-07 ENCOUNTER — Emergency Department (HOSPITAL_BASED_OUTPATIENT_CLINIC_OR_DEPARTMENT_OTHER)
Admission: EM | Admit: 2021-05-07 | Discharge: 2021-05-07 | Disposition: A | Discharge status: Home / Self Care | Payer: Self-pay | Attending: Emergency Medicine | Admitting: Emergency Medicine

## 2021-05-07 DIAGNOSIS — Z79899 Other long term (current) drug therapy: Secondary | ICD-10-CM | POA: Insufficient documentation

## 2021-05-07 DIAGNOSIS — X509XXA Other and unspecified overexertion or strenuous movements or postures, initial encounter: Secondary | ICD-10-CM | POA: Insufficient documentation

## 2021-05-07 DIAGNOSIS — I10 Essential (primary) hypertension: Secondary | ICD-10-CM | POA: Insufficient documentation

## 2021-05-07 DIAGNOSIS — S92351A Displaced fracture of fifth metatarsal bone, right foot, initial encounter for closed fracture: Secondary | ICD-10-CM | POA: Insufficient documentation

## 2021-05-07 DIAGNOSIS — Z1231 Encounter for screening mammogram for malignant neoplasm of breast: Secondary | ICD-10-CM

## 2021-05-07 MED ORDER — HYDROCODONE-ACETAMINOPHEN 5-325 MG PO TABS
1.0000 | ORAL_TABLET | Freq: Once | ORAL | Status: AC
  Administered 2021-05-07: 1 via ORAL
  Filled 2021-05-07: qty 1

## 2021-05-07 MED ORDER — HYDROCODONE-ACETAMINOPHEN 5-325 MG PO TABS: 1.0000 | ORAL_TABLET | Freq: Three times a day (TID) | ORAL | 0 refills | Status: DC | PRN

## 2021-05-07 MED ORDER — FENTANYL CITRATE PF 50 MCG/ML IJ SOSY
100.0000 ug | PREFILLED_SYRINGE | Freq: Once | INTRAMUSCULAR | Status: AC
  Administered 2021-05-07: 100 ug via INTRAMUSCULAR
  Filled 2021-05-07: qty 2

## 2021-05-07 NOTE — ED Triage Notes (Signed)
Pt arrives pov with c/o right ankle pain, endorses feeling "pop" after rolling ankle when stepping down stairs. Swelling noted. Denies otc meds pta

## 2021-05-07 NOTE — ED Notes (Signed)
Discharge instructions discussed with pt. Pt verbalized understanding. Pt stable and ambulatory. No signature pad available. 

## 2021-05-07 NOTE — Discharge Instructions (Addendum)
Take 600mg  ibuprofen every 6 hours as needed for pain   If you have breakthrough pain you can take one tablet of norco every 8 hours   With regards to Norco, take medication as directed and do not operate machinery, drive a car, or work while taking this medication as it can make you drowsy.   Do not put weight on the right foot. Call orthopedics to schedule an appointment for follow up  Please return to the emergency department for any new or worsening symptoms.

## 2021-05-07 NOTE — ED Provider Notes (Signed)
[facility] [place] [facility] EMERGENCY DEPT Provider Note   CSN: [id number] Arrival date & time: 05/07/21  1558     History Chief Complaint  Patient presents with   Ankle Injury    Abigail Beasley is a 50 y.o. female.  HPI  50 year old female with a history of allergies, elevated serum creatinine, proteinuria syndrome, hypertension, left flank pain, who presents to the emergency department today for evaluation of right foot pain.  States that she stepped on the steps wrong in her home and felt a pop in her foot.  She had immediate onset of severe pain to the right foot which was worse with weightbearing and movement.  She denies any other injuries including no head trauma or LOC.  She is not anticoagulated.  Past Medical History:  Diagnosis Date   Allergy    Elevated serum creatinine 01/2019   History of proteinuria syndrome 01/2019   Hypertension    Left flank pain 01/2019   PONV (postoperative nausea and vomiting)     Patient Active Problem List   Diagnosis Date Noted   Trichomonas vaginitis 08/15/2020   Status post laparoscopic hysterectomy 11/08/2016   Severe obesity (BMI >= 40) (HCC) 07/28/2014   Dizziness and giddiness 03/18/2013   Essential hypertension, benign 03/18/2013   Breast pain, left 03/18/2013    Past Surgical History:  Procedure Laterality Date   ABDOMINAL HYSTERECTOMY     CYSTOSCOPY N/A 11/08/2016   Procedure: CYSTOSCOPY;  Surgeon: Salvadore Dom, MD;  Location: [facility];  Service: Gynecology;  Laterality: N/A;   DILATION AND EVACUATION     miscarriage   TOTAL LAPAROSCOPIC HYSTERECTOMY WITH SALPINGECTOMY Bilateral 11/08/2016   Procedure: HYSTERECTOMY TOTAL LAPAROSCOPIC WITH BILATERAL SALPINGECTOMY;  Surgeon: Salvadore Dom, MD;  Location: [facility];  Service: Gynecology;  Laterality: Bilateral;   TUBAL LIGATION       OB History     Gravida  4   Para  2   Term  2   Preterm      AB  2   Living  2      SAB  1   IAB      Ectopic       Multiple      Live Births  2           Family History  Problem Relation Age of Onset   Diabetes Mother    Hypertension Mother    Cancer Mother        liver    Hyperlipidemia Mother    Stroke Mother    Diabetes Maternal Grandmother    Hypertension Maternal Grandmother    Hyperlipidemia Maternal Grandmother    Heart disease Maternal Grandmother    Hypertension Sister    Cancer Maternal Grandfather        prostate   Diabetes Maternal Grandfather    Hyperlipidemia Maternal Grandfather    Hypertension Maternal Grandfather    Colon cancer Maternal Grandfather    Diabetes Sister    Diabetes Maternal Uncle     Social History   Tobacco Use   Smoking status: Never   Smokeless tobacco: Never  Vaping Use   Vaping Use: Never used  Substance Use Topics   Alcohol use: Yes    Alcohol/week: 3.0 standard drinks    Types: 3 Standard drinks or equivalent per week    Comment: occ   Drug use: Not Currently    Types: Marijuana    Comment: occ    Home Medications Prior to Admission  medications   Medication Sig Start Date End Date Taking? Authorizing Provider  HYDROcodone-acetaminophen (NORCO/VICODIN) 5-325 MG tablet Take 1 tablet by mouth every 8 (eight) hours as needed. 05/07/21  Yes Raine Blodgett S, PA-C  ergocalciferol (VITAMIN D2) 1.25 MG (50000 UT) capsule Take 1 capsule (50,000 Units total) by mouth once a week. 01/28/21   [name], FNP  gabapentin (NEURONTIN) 300 MG capsule Take 1 capsule (300 mg total) by mouth every 8 (eight) hours. 01/26/21   [name], FNP  loratadine (CLARITIN) 10 MG tablet Take 1 tablet (10 mg total) by mouth daily. 05/04/21   [name], FNP  Multiple Vitamin (MULTIVITAMIN WITH MINERALS) TABS tablet Take 1 tablet by mouth 4 (four) times a week.    [provider]  triamterene-hydrochlorothiazide (MAXZIDE-25) 37.5-25 MG tablet Take 1 tablet by mouth daily. 05/04/21   [name], FNP  omeprazole (PRILOSEC) 40 MG  capsule Take 1 capsule (40 mg total) by mouth daily. Patient not taking: Reported on 12/17/2019 09/17/19 05/06/20  [name], FNP    Allergies    Lisinopril  Review of Systems   Review of Systems  Constitutional:  Negative for fever.  Gastrointestinal:  Negative for abdominal pain.  Musculoskeletal:  Negative for back pain.       Right ankle/foot pain  Skin:  Negative for wound.   Physical Exam Updated Vital Signs BP (!) 157/94 (BP Location: Left Arm)   Pulse 72   Temp 98.1 F (36.7 C)   Resp 18   Ht 5\' 7"  (1.702 m)   Wt 125.6 kg   LMP 10/17/2016 (Exact Date)   SpO2 95%   BMI 43.38 kg/m   Physical Exam Vitals and nursing note reviewed.  Constitutional:      General: She is not in acute distress.    Appearance: She is well-developed.  HENT:     Head: Normocephalic and atraumatic.  Eyes:     Conjunctiva/sclera: Conjunctivae normal.  Cardiovascular:     Rate and Rhythm: Normal rate.  Pulmonary:     Effort: Pulmonary effort is normal.  Musculoskeletal:     Cervical back: Neck supple.     Comments: TTP and swelling to the right lateral foot. NVI distally  Skin:    General: Skin is warm and dry.     Capillary Refill: Capillary refill takes less than 2 seconds.  Neurological:     Mental Status: She is alert.  Psychiatric:        Mood and Affect: Mood normal.    ED Results / Procedures / Treatments   Labs (all labs ordered are listed, but only abnormal results are displayed) Labs Reviewed - No data to display  EKG None  Radiology DG Ankle Complete Right  Result Date: 05/07/2021 CLINICAL DATA:  Injury, fall down stairs. EXAM: RIGHT ANKLE - COMPLETE 3+ VIEW COMPARISON:  Foot exam of the same date. FINDINGS: No signs of acute fracture involving the ankle. Incidental note is made of a fracture, transversely oriented at the base of the fifth metatarsal with displacement of the proximal fracture fragment. 5 mm displacementw of the proximal fracture fragment.  Soft tissue swelling along the lateral foot and ankle. Midfoot degenerative changes. IMPRESSION: Displaced fracture of the base of the fifth metatarsal. Better evaluated on foot evaluation of the same date. No fractures about the ankle joint. Electronically Signed   By: Zetta Bills M.D.   On: 05/07/2021 16:50   DG Foot Complete Right  Result  Date: 05/07/2021 CLINICAL DATA:  Foot pain and swelling about the fifth metatarsal. EXAM: RIGHT FOOT COMPLETE - 3+ VIEW COMPARISON:  Ankle evaluation of the same date. FINDINGS: Displaced fracture with transverse orientation at the base of the fifth metatarsal. Approximately 5-6 mm lateral displacement of the proximal fracture fragment. There is fragmentation of proximal fracture fragment. This appears fragmented into 2 discrete fragments. No additional fractures.  Extensive soft tissue swelling. IMPRESSION: Moderately displaced and mildly comminuted fracture at the base of the fifth metatarsal. Associated soft tissue swelling. Electronically Signed   By: Zetta Bills M.D.   On: 05/07/2021 16:52    Procedures Procedures   SPLINT APPLICATION Date/Time: 0:26 PM Authorized by: Rodney Booze Consent: Verbal consent obtained. Risks and benefits: risks, benefits and alternatives were discussed Consent given by: patient Splint applied by: technician Location details: RLE Splint type: posterior ankle splint Supplies used: orthoglass and ace wrap Post-procedure: The splinted body part was neurovascularly unchanged following the procedure. Patient tolerance: Patient tolerated the procedure well with no immediate complications.   Medications Ordered in ED Medications  fentaNYL (SUBLIMAZE) injection 100 mcg (has no administration in time range)  HYDROcodone-acetaminophen (NORCO/VICODIN) 5-325 MG per tablet 1 tablet (has no administration in time range)    ED Course  I have reviewed the triage vital signs and the nursing notes.  Pertinent labs &  imaging results that were available during my care of the patient were reviewed by me and considered in my medical decision making (see chart for details).    MDM Rules/Calculators/A&P                          50 year old female here with mechanical fall complaining of right foot pain.  Imaging reviewed/interpreted X-ray right foot -  Moderately displaced and mildly comminuted fracture at the base of the fifth metatarsal. Associated soft tissue swelling. X-ray right ankle -  Displaced fracture of the base of the fifth metatarsal. Better evaluated on foot evaluation of the same date. No fractures about the ankle joint.   Patient placed in splint and will have them follow-up with orthopedics.  Gave crutches and advised to be nonweightbearing.  We will give pain medications and advised on plan for follow-up and strict return precautions.  She voiced understanding the plan and reasons to return.  All questions answered.  Patient stable for discharge.   Final Clinical Impression(s) / ED Diagnoses Final diagnoses:  Displaced fracture of fifth metatarsal bone, right foot, initial encounter for closed fracture    Rx / DC Orders ED Discharge Orders          Ordered    HYDROcodone-acetaminophen (NORCO/VICODIN) 5-325 MG tablet  Every 8 hours PRN        05/07/21 1729             Rodney Booze, PA-C 05/07/21 1731    Davonna Belling, MD 05/07/21 2332

## 2021-05-07 NOTE — ED Notes (Signed)
Xray at Bedside

## 2021-05-10 ENCOUNTER — Telehealth: Payer: Self-pay

## 2021-05-10 NOTE — Telephone Encounter (Signed)
Pt called and was seen at the hospital over the weekend for a broken foot they wanted her to FU with Dr. Marlou Sa by the 16th.  Do you want to open a slot or to see a different provider?

## 2021-05-11 ENCOUNTER — Ambulatory Visit (INDEPENDENT_AMBULATORY_CARE_PROVIDER_SITE_OTHER): Payer: Self-pay | Admitting: Orthopaedic Surgery

## 2021-05-11 ENCOUNTER — Other Ambulatory Visit: Payer: Self-pay

## 2021-05-11 DIAGNOSIS — S92354A Nondisplaced fracture of fifth metatarsal bone, right foot, initial encounter for closed fracture: Secondary | ICD-10-CM

## 2021-05-11 NOTE — Progress Notes (Signed)
Chief Complaint: right foot pain     History of Present Illness:    Abigail Beasley is a 50 y.o. female with right foot pain after taking a statin.  She immediately felt a pop in the right base of the fifth metatarsal.  Since that time she has difficulty putting weight on the foot.  She presented to the emergency room.  She was placed in a splint and made nonweightbearing.  She was told to follow-up here.  She denies any previous history of fractures or bony injuries.  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    Surgical History:   None  PMH/PSH/Family History/Social History/Meds/Allergies:    Past Medical History:  Diagnosis Date   Allergy    Elevated serum creatinine 01/2019   History of proteinuria syndrome 01/2019   Hypertension    Left flank pain 01/2019   PONV (postoperative nausea and vomiting)    Past Surgical History:  Procedure Laterality Date   ABDOMINAL HYSTERECTOMY     CYSTOSCOPY N/A 11/08/2016   Procedure: CYSTOSCOPY;  Surgeon: Salvadore Dom, MD;  Location: [facility];  Service: Gynecology;  Laterality: N/A;   DILATION AND EVACUATION     miscarriage   TOTAL LAPAROSCOPIC HYSTERECTOMY WITH SALPINGECTOMY Bilateral 11/08/2016   Procedure: HYSTERECTOMY TOTAL LAPAROSCOPIC WITH BILATERAL SALPINGECTOMY;  Surgeon: Salvadore Dom, MD;  Location: [facility];  Service: Gynecology;  Laterality: Bilateral;   TUBAL LIGATION     Social History   Socioeconomic History   Marital status: Single    Spouse name: Not on file   Number of children: Not on file   Years of education: Not on file   Highest education level: Not on file  Occupational History   Not on file  Tobacco Use   Smoking status: Never   Smokeless tobacco: Never  Vaping Use   Vaping Use: Never used  Substance and Sexual Activity   Alcohol use: Yes    Alcohol/week: 3.0 standard drinks    Types: 3 Standard  drinks or equivalent per week    Comment: occ   Drug use: Not Currently    Types: Marijuana    Comment: occ   Sexual activity: Not Currently    Partners: Male    Birth control/protection: Surgical  Other Topics Concern   Not on file  Social History Narrative   Marital status: single; not dating.      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      Tobacco: none      Alcohol:  Socially; rarely.      Drugs: none; marijuana in the past.      Exercise: none      History of incarceration for six years (guilt by association; friend robbed bank).      Sexual activity:  Males; last STD Gonorrhea at age 52.  Syphilis in twenties.    Social Determinants of Health   Financial Resource Strain: Not on file  Food Insecurity: Not on file  Transportation Needs: Not on file  Physical Activity: Not on file  Stress: Not on file  Social Connections: Not on file  Family History  Problem Relation Age of Onset   Diabetes Mother    Hypertension Mother    Cancer Mother        liver    Hyperlipidemia Mother    Stroke Mother    Diabetes Maternal Grandmother    Hypertension Maternal Grandmother    Hyperlipidemia Maternal Grandmother    Heart disease Maternal Grandmother    Hypertension Sister    Cancer Maternal Grandfather        prostate   Diabetes Maternal Grandfather    Hyperlipidemia Maternal Grandfather    Hypertension Maternal Grandfather    Colon cancer Maternal Grandfather    Diabetes Sister    Diabetes Maternal Uncle    Allergies  Allergen Reactions   Lisinopril Cough   Current Outpatient Medications  Medication Sig Dispense Refill   ergocalciferol (VITAMIN D2) 1.25 MG (50000 UT) capsule Take 1 capsule (50,000 Units total) by mouth once a week. 12 capsule 1   gabapentin (NEURONTIN) 300 MG capsule Take 1 capsule (300 mg total) by mouth every 8 (eight) hours. 90 capsule 3    HYDROcodone-acetaminophen (NORCO/VICODIN) 5-325 MG tablet Take 1 tablet by mouth every 8 (eight) hours as needed. 12 tablet 0   loratadine (CLARITIN) 10 MG tablet Take 1 tablet (10 mg total) by mouth daily. 30 tablet 11   Multiple Vitamin (MULTIVITAMIN WITH MINERALS) TABS tablet Take 1 tablet by mouth 4 (four) times a week.     triamterene-hydrochlorothiazide (MAXZIDE-25) 37.5-25 MG tablet Take 1 tablet by mouth daily. 90 tablet 3   No current facility-administered medications for this visit.   No results found.  Review of Systems:   A ROS was performed including pertinent positives and negatives as documented in the HPI.  Physical Exam :   Constitutional: NAD and appears stated age Neurological: Alert and oriented Psych: Appropriate affect and cooperative Last menstrual period 10/17/2016.   Comprehensive Musculoskeletal Exam:    Tenderness to palpation about the base of the fifth metatarsal.  There is swelling about the midfoot.  She is able to extend and flex all of her toes as well as her ankle.  2+ dorsalis pedis pulse.  Imaging:   Xray (3 views right foot): Zone 1, 5 metatarsal base fracture with 2 to 3 mm of displacement   I personally reviewed and interpreted the radiographs.   Assessment:   50 year old female with right fifth metatarsal base fracture with 2-3 millimeters of displacement.  Overall I do believe that this will heal well without surgery.  At this time she can progress her weightbearing as tolerated.  She is planning to order a cam boot online and can be weightbearing as tolerated of this.  I will see her back in 2 weeks.  She will be out of work until that point.  Plan :    -Return to clinic in 2 weeks     I personally saw and evaluated the patient, and participated in the management and treatment plan.  Vanetta Mulders, MD Attending Physician, Orthopedic Surgery  This document was dictated using Dragon voice recognition software. A reasonable attempt  at proof reading has been made to minimize errors.

## 2021-05-19 ENCOUNTER — Encounter (HOSPITAL_COMMUNITY): Payer: Self-pay

## 2021-05-19 ENCOUNTER — Emergency Department (HOSPITAL_COMMUNITY)
Admission: EM | Admit: 2021-05-19 | Discharge: 2021-05-19 | Disposition: A | Discharge status: Home / Self Care | Payer: Self-pay | Attending: Emergency Medicine | Admitting: Emergency Medicine

## 2021-05-19 ENCOUNTER — Other Ambulatory Visit: Payer: Self-pay

## 2021-05-19 DIAGNOSIS — W25XXXA Contact with sharp glass, initial encounter: Secondary | ICD-10-CM | POA: Insufficient documentation

## 2021-05-19 DIAGNOSIS — Z5321 Procedure and treatment not carried out due to patient leaving prior to being seen by health care provider: Secondary | ICD-10-CM | POA: Insufficient documentation

## 2021-05-19 DIAGNOSIS — S61210A Laceration without foreign body of right index finger without damage to nail, initial encounter: Secondary | ICD-10-CM | POA: Insufficient documentation

## 2021-05-19 NOTE — ED Triage Notes (Signed)
Pt reports accidentally cutting her right index finger on glass while washing dishes. Bleeding controlled at this time.

## 2021-05-25 ENCOUNTER — Ambulatory Visit (HOSPITAL_BASED_OUTPATIENT_CLINIC_OR_DEPARTMENT_OTHER)
Admission: RE | Admit: 2021-05-25 | Discharge: 2021-05-25 | Disposition: A | Discharge status: Home / Self Care | Payer: Self-pay | Source: Ambulatory Visit | Attending: Orthopaedic Surgery | Admitting: Orthopaedic Surgery

## 2021-05-25 ENCOUNTER — Other Ambulatory Visit: Payer: Self-pay

## 2021-05-25 ENCOUNTER — Ambulatory Visit (INDEPENDENT_AMBULATORY_CARE_PROVIDER_SITE_OTHER): Payer: Self-pay | Admitting: Orthopaedic Surgery

## 2021-05-25 ENCOUNTER — Other Ambulatory Visit (HOSPITAL_BASED_OUTPATIENT_CLINIC_OR_DEPARTMENT_OTHER): Payer: Self-pay | Admitting: Orthopaedic Surgery

## 2021-05-25 DIAGNOSIS — S92354A Nondisplaced fracture of fifth metatarsal bone, right foot, initial encounter for closed fracture: Secondary | ICD-10-CM

## 2021-05-25 MED ORDER — MELOXICAM 15 MG PO TABS: 15.0000 mg | ORAL_TABLET | Freq: Every day | ORAL | 3 refills | Status: DC

## 2021-05-25 NOTE — Progress Notes (Signed)
Chief Complaint: right foot pain     History of Present Illness:   05/25/2021: Presents today doing well. She has been in a cam boot all the time.  She is slowly progressing her weightbearing.  She is now able to put essentially full weightbearing in the boot.  Pain is well controlled.  Abigail Beasley is a 50 y.o. female with right foot pain after taking a statin.  She immediately felt a pop in the right base of the fifth metatarsal.  Since that time she has difficulty putting weight on the foot.  She presented to the emergency room.  She was placed in a splint and made nonweightbearing.  She was told to follow-up here.  She denies any previous history of fractures or bony injuries.  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    Surgical History:   None  PMH/PSH/Family History/Social History/Meds/Allergies:    Past Medical History:  Diagnosis Date   Allergy    Elevated serum creatinine 01/2019   History of proteinuria syndrome 01/2019   Hypertension    Left flank pain 01/2019   PONV (postoperative nausea and vomiting)    Past Surgical History:  Procedure Laterality Date   ABDOMINAL HYSTERECTOMY     CYSTOSCOPY N/A 11/08/2016   Procedure: CYSTOSCOPY;  Surgeon: Salvadore Dom, MD;  Location: [facility];  Service: Gynecology;  Laterality: N/A;   DILATION AND EVACUATION     miscarriage   TOTAL LAPAROSCOPIC HYSTERECTOMY WITH SALPINGECTOMY Bilateral 11/08/2016   Procedure: HYSTERECTOMY TOTAL LAPAROSCOPIC WITH BILATERAL SALPINGECTOMY;  Surgeon: Salvadore Dom, MD;  Location: [facility];  Service: Gynecology;  Laterality: Bilateral;   TUBAL LIGATION     Social History   Socioeconomic History   Marital status: Single    Spouse name: Not on file   Number of children: Not on file   Years of education: Not on file   Highest education level: Not on file  Occupational History   Not on file   Tobacco Use   Smoking status: Never   Smokeless tobacco: Never  Vaping Use   Vaping Use: Never used  Substance and Sexual Activity   Alcohol use: Yes    Alcohol/week: 3.0 standard drinks    Types: 3 Standard drinks or equivalent per week    Comment: occ   Drug use: Not Currently    Types: Marijuana    Comment: occ   Sexual activity: Not Currently    Partners: Male    Birth control/protection: Surgical  Other Topics Concern   Not on file  Social History Narrative   Marital status: single; not dating.      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      Tobacco: none      Alcohol:  Socially; rarely.      Drugs: none; marijuana in the past.      Exercise: none      History of incarceration for six years (guilt by association; friend robbed bank).      Sexual activity:  Males; last STD Gonorrhea at age 61.  Syphilis in twenties.    Social  Determinants of Health   Financial Resource Strain: Not on file  Food Insecurity: Not on file  Transportation Needs: Not on file  Physical Activity: Not on file  Stress: Not on file  Social Connections: Not on file   Family History  Problem Relation Age of Onset   Diabetes Mother    Hypertension Mother    Cancer Mother        liver    Hyperlipidemia Mother    Stroke Mother    Diabetes Maternal Grandmother    Hypertension Maternal Grandmother    Hyperlipidemia Maternal Grandmother    Heart disease Maternal Grandmother    Hypertension Sister    Cancer Maternal Grandfather        prostate   Diabetes Maternal Grandfather    Hyperlipidemia Maternal Grandfather    Hypertension Maternal Grandfather    Colon cancer Maternal Grandfather    Diabetes Sister    Diabetes Maternal Uncle    Allergies  Allergen Reactions   Lisinopril Cough   Current Outpatient Medications  Medication Sig Dispense Refill   ergocalciferol (VITAMIN D2) 1.25 MG (50000  UT) capsule Take 1 capsule (50,000 Units total) by mouth once a week. 12 capsule 1   gabapentin (NEURONTIN) 300 MG capsule Take 1 capsule (300 mg total) by mouth every 8 (eight) hours. 90 capsule 3   HYDROcodone-acetaminophen (NORCO/VICODIN) 5-325 MG tablet Take 1 tablet by mouth every 8 (eight) hours as needed. 12 tablet 0   loratadine (CLARITIN) 10 MG tablet Take 1 tablet (10 mg total) by mouth daily. 30 tablet 11   Multiple Vitamin (MULTIVITAMIN WITH MINERALS) TABS tablet Take 1 tablet by mouth 4 (four) times a week.     triamterene-hydrochlorothiazide (MAXZIDE-25) 37.5-25 MG tablet Take 1 tablet by mouth daily. 90 tablet 3   No current facility-administered medications for this visit.   No results found.  Review of Systems:   A ROS was performed including pertinent positives and negatives as documented in the HPI.  Physical Exam :   Constitutional: NAD and appears stated age Neurological: Alert and oriented Psych: Appropriate affect and cooperative Last menstrual period 10/17/2016.   Comprehensive Musculoskeletal Exam:    Tenderness to palpation about the base of the fifth metatarsal.  There is swelling about the midfoot.  She is able to extend and flex all of her toes as well as her ankle.  2+ dorsalis pedis pulse.  Imaging:   Xray (3 views right foot): Zone 1, 5 metatarsal base fracture with 2 to 3 mm of displacement   I personally reviewed and interpreted the radiographs.   Assessment:   50 year old female with right fifth metatarsal base fracture with 2-3 millimeters of displacement.  X-rays today show stable alignment.  I would like her to be able to return to work on light duty.  I will see her back in 8 weeks for repeat x-rays at that time we will plan to return to full duty  Plan :    -Return to clinic in 8 weeks     I personally saw and evaluated the patient, and participated in the management and treatment plan.  Vanetta Mulders, MD Attending Physician,  Orthopedic Surgery  This document was dictated using Dragon voice recognition software. A reasonable attempt at proof reading has been made to minimize errors.

## 2021-05-25 NOTE — Addendum Note (Signed)
Addended by: Yevonne Pax on: 05/25/2021 05:03 PM   Modules accepted: Orders

## 2021-07-26 ENCOUNTER — Telehealth (HOSPITAL_BASED_OUTPATIENT_CLINIC_OR_DEPARTMENT_OTHER): Payer: Self-pay | Admitting: Orthopaedic Surgery

## 2021-07-26 ENCOUNTER — Ambulatory Visit (HOSPITAL_BASED_OUTPATIENT_CLINIC_OR_DEPARTMENT_OTHER)
Admission: RE | Admit: 2021-07-26 | Discharge: 2021-07-26 | Disposition: A | Discharge status: Home / Self Care | Payer: Self-pay | Source: Ambulatory Visit | Attending: Orthopaedic Surgery | Admitting: Orthopaedic Surgery

## 2021-07-26 ENCOUNTER — Other Ambulatory Visit (HOSPITAL_BASED_OUTPATIENT_CLINIC_OR_DEPARTMENT_OTHER): Payer: Self-pay | Admitting: Orthopaedic Surgery

## 2021-07-26 ENCOUNTER — Encounter (HOSPITAL_COMMUNITY): Payer: Self-pay

## 2021-07-26 ENCOUNTER — Ambulatory Visit (HOSPITAL_COMMUNITY): Admission: RE | Admit: 2021-07-26 | Payer: Self-pay | Source: Ambulatory Visit

## 2021-07-26 ENCOUNTER — Ambulatory Visit (HOSPITAL_BASED_OUTPATIENT_CLINIC_OR_DEPARTMENT_OTHER): Payer: Self-pay | Admitting: Orthopaedic Surgery

## 2021-07-26 ENCOUNTER — Ambulatory Visit (HOSPITAL_COMMUNITY)
Admission: RE | Admit: 2021-07-26 | Discharge: 2021-07-26 | Disposition: A | Discharge status: Home / Self Care | Payer: Self-pay | Source: Ambulatory Visit | Attending: Orthopaedic Surgery | Admitting: Orthopaedic Surgery

## 2021-07-26 ENCOUNTER — Other Ambulatory Visit: Payer: Self-pay

## 2021-07-26 DIAGNOSIS — S92354A Nondisplaced fracture of fifth metatarsal bone, right foot, initial encounter for closed fracture: Secondary | ICD-10-CM

## 2021-07-26 NOTE — Telephone Encounter (Signed)
Rescheduled appt

## 2021-07-27 ENCOUNTER — Ambulatory Visit (INDEPENDENT_AMBULATORY_CARE_PROVIDER_SITE_OTHER): Payer: Self-pay | Admitting: Orthopaedic Surgery

## 2021-07-27 ENCOUNTER — Ambulatory Visit (HOSPITAL_BASED_OUTPATIENT_CLINIC_OR_DEPARTMENT_OTHER): Payer: Self-pay | Admitting: Orthopaedic Surgery

## 2021-07-27 DIAGNOSIS — S92354A Nondisplaced fracture of fifth metatarsal bone, right foot, initial encounter for closed fracture: Secondary | ICD-10-CM

## 2021-07-27 NOTE — Progress Notes (Signed)
Chief Complaint: right foot pain     History of Present Illness:   07/27/2021: Presents today for follow-up of the right foot.  She has been in the boot back at work.  She is able to walk easily on carpet but is having a harder time weightbearing on hardwood floors.  She is able to work but there is some soreness in the lateral aspect of the foot  Abigail Beasley is a 51 y.o. female with right foot pain after taking a statin.  She immediately felt a pop in the right base of the fifth metatarsal.  Since that time she has difficulty putting weight on the foot.  She presented to the emergency room.  She was placed in a splint and made nonweightbearing.  She was told to follow-up here.  She denies any previous history of fractures or bony injuries.  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    Surgical History:   None  PMH/PSH/Family History/Social History/Meds/Allergies:    Past Medical History:  Diagnosis Date   Allergy    Elevated serum creatinine 01/2019   History of proteinuria syndrome 01/2019   Hypertension    Left flank pain 01/2019   PONV (postoperative nausea and vomiting)    Past Surgical History:  Procedure Laterality Date   ABDOMINAL HYSTERECTOMY     CYSTOSCOPY N/A 11/08/2016   Procedure: CYSTOSCOPY;  Surgeon: Salvadore Dom, MD;  Location: [facility];  Service: Gynecology;  Laterality: N/A;   DILATION AND EVACUATION     miscarriage   TOTAL LAPAROSCOPIC HYSTERECTOMY WITH SALPINGECTOMY Bilateral 11/08/2016   Procedure: HYSTERECTOMY TOTAL LAPAROSCOPIC WITH BILATERAL SALPINGECTOMY;  Surgeon: Salvadore Dom, MD;  Location: [facility];  Service: Gynecology;  Laterality: Bilateral;   TUBAL LIGATION     Social History   Socioeconomic History   Marital status: Single    Spouse name: Not on file   Number of children: Not on file   Years of education: Not on file   Highest  education level: Not on file  Occupational History   Not on file  Tobacco Use   Smoking status: Never   Smokeless tobacco: Never  Vaping Use   Vaping Use: Never used  Substance and Sexual Activity   Alcohol use: Yes    Alcohol/week: 3.0 standard drinks    Types: 3 Standard drinks or equivalent per week    Comment: occ   Drug use: Not Currently    Types: Marijuana    Comment: occ   Sexual activity: Not Currently    Partners: Male    Birth control/protection: Surgical  Other Topics Concern   Not on file  Social History Narrative   Marital status: single; not dating.      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      Tobacco: none      Alcohol:  Socially; rarely.      Drugs: none; marijuana in the past.      Exercise: none      History of incarceration for six years (guilt by association; friend robbed bank).      Sexual activity:  Males; last STD Gonorrhea at age 62.  Syphilis in twenties.    Social Determinants of Health   Financial Resource Strain: Not on file  Food Insecurity: Not on file  Transportation Needs: Not on file  Physical Activity: Not on file  Stress: Not on file  Social Connections: Not on file   Family History  Problem Relation Age of Onset   Diabetes Mother    Hypertension Mother    Cancer Mother        liver    Hyperlipidemia Mother    Stroke Mother    Diabetes Maternal Grandmother    Hypertension Maternal Grandmother    Hyperlipidemia Maternal Grandmother    Heart disease Maternal Grandmother    Hypertension Sister    Cancer Maternal Grandfather        prostate   Diabetes Maternal Grandfather    Hyperlipidemia Maternal Grandfather    Hypertension Maternal Grandfather    Colon cancer Maternal Grandfather    Diabetes Sister    Diabetes Maternal Uncle    Allergies  Allergen Reactions   Lisinopril Cough   Current Outpatient Medications   Medication Sig Dispense Refill   ergocalciferol (VITAMIN D2) 1.25 MG (50000 UT) capsule Take 1 capsule (50,000 Units total) by mouth once a week. 12 capsule 1   gabapentin (NEURONTIN) 300 MG capsule Take 1 capsule (300 mg total) by mouth every 8 (eight) hours. 90 capsule 3   HYDROcodone-acetaminophen (NORCO/VICODIN) 5-325 MG tablet Take 1 tablet by mouth every 8 (eight) hours as needed. 12 tablet 0   loratadine (CLARITIN) 10 MG tablet Take 1 tablet (10 mg total) by mouth daily. 30 tablet 11   meloxicam (MOBIC) 15 MG tablet Take 1 tablet (15 mg total) by mouth daily. 30 tablet 3   Multiple Vitamin (MULTIVITAMIN WITH MINERALS) TABS tablet Take 1 tablet by mouth 4 (four) times a week.     triamterene-hydrochlorothiazide (MAXZIDE-25) 37.5-25 MG tablet Take 1 tablet by mouth daily. 90 tablet 3   No current facility-administered medications for this visit.   No results found.  Review of Systems:   A ROS was performed including pertinent positives and negatives as documented in the HPI.  Physical Exam :   Constitutional: NAD and appears stated age Neurological: Alert and oriented Psych: Appropriate affect and cooperative Last menstrual period 10/17/2016.   Comprehensive Musculoskeletal Exam:    She is having tenderness today that is predominantly about the distal aspect of the fifth metatarsal not necessarily at the fracture site.  Swelling is much improved.  She is able to extend and flex all of her toes as well as her ankle.  2+ dorsalis pedis pulse.  Imaging:   Xray (3 views right foot): Zone 1, 5 metatarsal base fracture with 2 to 3 mm of displacement, there is interval healing and callus formation   I personally reviewed and interpreted the radiographs.   Assessment:   51 year old female with right fifth metatarsal base fracture with 2-3 millimeters of displacement.  X-rays today show interval healing.  At today's visit I have counseled her on a postop shoe to try believe that she  can purchase off of [employer].  We will transition her into this.  I will see her back in 6 weeks and at that time I will plan to release her without restrictions  Plan :    -Return to clinic in      I personally saw and evaluated the patient, and participated in the management and treatment plan.  Vanetta Mulders, MD Attending Physician, Orthopedic Surgery  This document was dictated using Dragon voice recognition software. A reasonable attempt at proof reading has been made to minimize errors.

## 2021-09-15 ENCOUNTER — Other Ambulatory Visit (HOSPITAL_BASED_OUTPATIENT_CLINIC_OR_DEPARTMENT_OTHER): Payer: Self-pay | Admitting: Orthopaedic Surgery

## 2021-09-15 ENCOUNTER — Ambulatory Visit (HOSPITAL_BASED_OUTPATIENT_CLINIC_OR_DEPARTMENT_OTHER)
Admission: RE | Admit: 2021-09-15 | Discharge: 2021-09-15 | Disposition: A | Discharge status: Home / Self Care | Payer: Self-pay | Source: Ambulatory Visit | Attending: Orthopaedic Surgery | Admitting: Orthopaedic Surgery

## 2021-09-15 ENCOUNTER — Ambulatory Visit (INDEPENDENT_AMBULATORY_CARE_PROVIDER_SITE_OTHER): Payer: Self-pay | Admitting: Orthopaedic Surgery

## 2021-09-15 DIAGNOSIS — S92354A Nondisplaced fracture of fifth metatarsal bone, right foot, initial encounter for closed fracture: Secondary | ICD-10-CM

## 2021-09-15 NOTE — Progress Notes (Signed)
? ?                            ? ? ?Chief Complaint: right foot pain ?  ? ? ?History of Present Illness:  ? ?09/15/2021: Presents today for follow-up of her right foot.  She recently went on a trip to [place] and experience more swelling in the foot.  She is having soreness at the lateral aspect of the foot.  Overall she continues to make slow improvement ? ? ?Abigail Beasley is a 51 y.o. female with right foot pain after taking a statin.  She immediately felt a pop in the right base of the fifth metatarsal.  Since that time she has difficulty putting weight on the foot.  She presented to the emergency room.  She was placed in a splint and made nonweightbearing.  She was told to follow-up here.  She denies any previous history of fractures or bony injuries.  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 ? ? ? ?Surgical History:   ?None ? ?PMH/PSH/Family History/Social History/Meds/Allergies:   ? ?Past Medical History:  ?Diagnosis Date  ? Allergy   ? Elevated serum creatinine 01/2019  ? History of proteinuria syndrome 01/2019  ? Hypertension   ? Left flank pain 01/2019  ? PONV (postoperative nausea and vomiting)   ? ?Past Surgical History:  ?Procedure Laterality Date  ? ABDOMINAL HYSTERECTOMY    ? CYSTOSCOPY N/A 11/08/2016  ? Procedure: CYSTOSCOPY;  Surgeon: Salvadore Dom, MD;  Location: [facility];  Service: Gynecology;  Laterality: N/A;  ? DILATION AND EVACUATION    ? miscarriage  ? TOTAL LAPAROSCOPIC HYSTERECTOMY WITH SALPINGECTOMY Bilateral 11/08/2016  ? Procedure: HYSTERECTOMY TOTAL LAPAROSCOPIC WITH BILATERAL SALPINGECTOMY;  Surgeon: Salvadore Dom, MD;  Location: [facility];  Service: Gynecology;  Laterality: Bilateral;  ? TUBAL LIGATION    ? ?Social History  ? ?Socioeconomic History  ? Marital status: Single  ?  Spouse name: Not on file  ? Number of children: Not on file  ? Years of education: Not on file  ? Highest education level: Not on file   ?Occupational History  ? Not on file  ?Tobacco Use  ? Smoking status: Never  ? Smokeless tobacco: Never  ?Vaping Use  ? Vaping Use: Never used  ?Substance and Sexual Activity  ? Alcohol use: Yes  ?  Alcohol/week: 3.0 standard drinks  ?  Types: 3 Standard drinks or equivalent per week  ?  Comment: occ  ? Drug use: Not Currently  ?  Types: Marijuana  ?  Comment: occ  ? Sexual activity: Not Currently  ?  Partners: Male  ?  Birth control/protection: Surgical  ?Other Topics Concern  ? Not on file  ?Social History Narrative  ? Marital status: single; not dating.  ?    Children: 2 children (26, 24); 3 grandchildren (7, 4, 2)  ?    Lives: alone  ?    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  ?    Tobacco: none  ?    Alcohol:  Socially; rarely.  ?    Drugs: none; marijuana in the past.  ?    Exercise: none  ?    History of incarceration for six years (guilt by association; friend robbed bank).  ?    Sexual activity:  Males; last STD Gonorrhea at age 39.  Syphilis  in twenties.   ? ?Social Determinants of Health  ? ?Financial Resource Strain: Not on file  ?Food Insecurity: Not on file  ?Transportation Needs: Not on file  ?Physical Activity: Not on file  ?Stress: Not on file  ?Social Connections: Not on file  ? ?Family History  ?Problem Relation Age of Onset  ? Diabetes Mother   ? Hypertension Mother   ? Cancer Mother   ?     liver   ? Hyperlipidemia Mother   ? Stroke Mother   ? Diabetes Maternal Grandmother   ? Hypertension Maternal Grandmother   ? Hyperlipidemia Maternal Grandmother   ? Heart disease Maternal Grandmother   ? Hypertension Sister   ? Cancer Maternal Grandfather   ?     prostate  ? Diabetes Maternal Grandfather   ? Hyperlipidemia Maternal Grandfather   ? Hypertension Maternal Grandfather   ? Colon cancer Maternal Grandfather   ? Diabetes Sister   ? Diabetes Maternal Uncle   ? ?Allergies  ?Allergen Reactions  ? Lisinopril Cough  ? ?Current Outpatient Medications  ?Medication Sig Dispense Refill  ?  ergocalciferol (VITAMIN D2) 1.25 MG (50000 UT) capsule Take 1 capsule (50,000 Units total) by mouth once a week. 12 capsule 1  ? gabapentin (NEURONTIN) 300 MG capsule Take 1 capsule (300 mg total) by mouth every 8 (eight) hours. 90 capsule 3  ? HYDROcodone-acetaminophen (NORCO/VICODIN) 5-325 MG tablet Take 1 tablet by mouth every 8 (eight) hours as needed. 12 tablet 0  ? loratadine (CLARITIN) 10 MG tablet Take 1 tablet (10 mg total) by mouth daily. 30 tablet 11  ? meloxicam (MOBIC) 15 MG tablet Take 1 tablet (15 mg total) by mouth daily. 30 tablet 3  ? Multiple Vitamin (MULTIVITAMIN WITH MINERALS) TABS tablet Take 1 tablet by mouth 4 (four) times a week.    ? triamterene-hydrochlorothiazide (MAXZIDE-25) 37.5-25 MG tablet Take 1 tablet by mouth daily. 90 tablet 3  ? ?No current facility-administered medications for this visit.  ? ?No results found. ? ?Review of Systems:   ?A ROS was performed including pertinent positives and negatives as documented in the HPI. ? ?Physical Exam :   ?Constitutional: NAD and appears stated age ?Neurological: Alert and oriented ?Psych: Appropriate affect and cooperative ?Last menstrual period 10/17/2016.  ? ?Comprehensive Musculoskeletal Exam:   ? ?She is having tenderness today that is predominantly about the distal aspect of the fifth metatarsal not necessarily at the fracture site.  Swelling is much improved.  She is able to extend and flex all of her toes as well as her ankle.  2+ dorsalis pedis pulse. ? ?Imaging:   ?Xray (3 views right foot): ?Zone 1, 5 metatarsal base fracture with 2 to 3 mm of displacement, there is interval healing and callus formation ? ? ?I personally reviewed and interpreted the radiographs. ? ? ?Assessment:   ?51 year old female with right fifth metatarsal base fracture with evidence of healing on x-ray.  At this time I have advised that she follow-up in 2 months as needed and. ? ?Plan :   ? ?Return to clinic as needed ? ? ? ? ?I personally saw and evaluated  the patient, and participated in the management and treatment plan. ? ?Vanetta Mulders, MD ?Attending Physician, Orthopedic Surgery ? ?This document was dictated using Systems analyst. A reasonable attempt at proof reading has been made to minimize errors. ? ?

## 2021-09-16 ENCOUNTER — Telehealth: Payer: Self-pay

## 2021-10-08 NOTE — Telephone Encounter (Signed)
No additional notes needed  

## 2021-11-02 ENCOUNTER — Ambulatory Visit (INDEPENDENT_AMBULATORY_CARE_PROVIDER_SITE_OTHER): Payer: Self-pay | Admitting: Family Medicine

## 2021-11-02 ENCOUNTER — Encounter: Payer: Self-pay | Admitting: Family Medicine

## 2021-11-02 VITALS — BP 153/92 | HR 72 | Temp 98.5°F | Ht 67.0 in | Wt 284.6 lb

## 2021-11-02 DIAGNOSIS — G629 Polyneuropathy, unspecified: Secondary | ICD-10-CM

## 2021-11-02 DIAGNOSIS — I10 Essential (primary) hypertension: Secondary | ICD-10-CM

## 2021-11-02 DIAGNOSIS — Z131 Encounter for screening for diabetes mellitus: Secondary | ICD-10-CM

## 2021-11-02 DIAGNOSIS — Z23 Encounter for immunization: Secondary | ICD-10-CM

## 2021-11-02 MED ORDER — MELOXICAM 15 MG PO TABS: 15.0000 mg | ORAL_TABLET | Freq: Every day | ORAL | 3 refills | Status: DC

## 2021-11-02 MED ORDER — AMLODIPINE BESYLATE 5 MG PO TABS
5.0000 mg | ORAL_TABLET | Freq: Every day | ORAL | 1 refills | Status: DC
Start: 2021-11-02 — End: 2022-05-11

## 2021-11-02 MED ORDER — GABAPENTIN 300 MG PO CAPS: 300.0000 mg | ORAL_CAPSULE | Freq: Three times a day (TID) | ORAL | 3 refills | Status: DC

## 2021-11-02 MED ORDER — HYDROCHLOROTHIAZIDE 25 MG PO TABS: 25.0000 mg | ORAL_TABLET | Freq: Every day | ORAL | 1 refills | Status: DC

## 2021-11-02 NOTE — Progress Notes (Signed)
Patient [facility]   Established Patient Office Visit  Subjective   Patient ID: Abigail Beasley, female    DOB: 02-24-1971  Age: 51 y.o. MRN: [id number]  Chief Complaint  Patient presents with   Follow-up    6 month follow up;Hypertension Patient states that she has been having a lot of headaches associated with stress. Patient states her blood pressure has been elevated.    Abigail Beasley is a 51 year old female with a medical history significant for hypertension and morbid obesity presents for follow-up of chronic conditions.  Patient states that she has been having elevated blood pressures over the past several months.  Patient did not take blood pressure medication prior to arrival.  Also, she is currently out of her blood pressure medications.  Patient endorses bilateral lower extremity swelling, primarily to her ankles.  Swelling is typically worse in the mornings.  She denies any chest pain, heart palpitations, or shortness of breath.  Hypertension This is a chronic problem. The problem is uncontrolled. Associated symptoms include peripheral edema. Pertinent negatives include no anxiety, blurred vision, chest pain, headaches, malaise/fatigue, neck pain, orthopnea, palpitations, PND, shortness of breath or sweats. Risk factors for coronary artery disease include obesity and sedentary lifestyle. There are no compliance problems.     Patient Active Problem List   Diagnosis Date Noted   Trichomonas vaginitis 08/15/2020   Status post laparoscopic hysterectomy 11/08/2016   Severe obesity (BMI >= 40) (HCC) 07/28/2014   Dizziness and giddiness 03/18/2013   Essential hypertension, benign 03/18/2013   Breast pain, left 03/18/2013   Past Medical History:  Diagnosis Date   Allergy    Elevated serum creatinine 01/2019   History of proteinuria syndrome 01/2019   Hypertension    Left flank pain 01/2019   PONV (postoperative nausea and vomiting)    Past  Surgical History:  Procedure Laterality Date   ABDOMINAL HYSTERECTOMY     CYSTOSCOPY N/A 11/08/2016   Procedure: CYSTOSCOPY;  Surgeon: Salvadore Dom, MD;  Location: [facility];  Service: Gynecology;  Laterality: N/A;   DILATION AND EVACUATION     miscarriage   TOTAL LAPAROSCOPIC HYSTERECTOMY WITH SALPINGECTOMY Bilateral 11/08/2016   Procedure: HYSTERECTOMY TOTAL LAPAROSCOPIC WITH BILATERAL SALPINGECTOMY;  Surgeon: Salvadore Dom, MD;  Location: [facility];  Service: Gynecology;  Laterality: Bilateral;   TUBAL LIGATION     Social History   Tobacco Use   Smoking status: Never   Smokeless tobacco: Never  Vaping Use   Vaping Use: Never used  Substance Use Topics   Alcohol use: Yes    Alcohol/week: 3.0 standard drinks of alcohol    Types: 3 Standard drinks or equivalent per week    Comment: occ   Drug use: Not Currently    Types: Marijuana    Comment: occ   Social History   Socioeconomic History   Marital status: Single    Spouse name: Not on file   Number of children: Not on file   Years of education: Not on file   Highest education level: Not on file  Occupational History   Not on file  Tobacco Use   Smoking status: Never   Smokeless tobacco: Never  Vaping Use   Vaping Use: Never used  Substance and Sexual Activity   Alcohol use: Yes    Alcohol/week: 3.0 standard drinks of alcohol    Types: 3 Standard drinks or equivalent per week    Comment: occ   Drug  use: Not Currently    Types: Marijuana    Comment: occ   Sexual activity: Not Currently    Partners: Male    Birth control/protection: Surgical  Other Topics Concern   Not on file  Social History Narrative   Marital status: single; not dating.      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      Tobacco: none      Alcohol:  Socially; rarely.      Drugs: none; marijuana in the past.      Exercise: none      History of  incarceration for six years (guilt by association; friend robbed bank).      Sexual activity:  Males; last STD Gonorrhea at age 74.  Syphilis in twenties.    Social Determinants of Health   Financial Resource Strain: Not on file  Food Insecurity: Not on file  Transportation Needs: Not on file  Physical Activity: Not on file  Stress: Not on file  Social Connections: Not on file  Intimate Partner Violence: Not on file   Family Status  Relation Name Status   Mother  Deceased at age 9       cancer liver   MGM  Alive   Sister  Alive   MGF  Deceased   Sister  Alive   Father  Alive       50   [place]  Deceased   Administrator  (Not Specified)   Family History  Problem Relation Age of Onset   Diabetes Mother    Hypertension Mother    Cancer Mother        liver    Hyperlipidemia Mother    Stroke Mother    Diabetes Maternal Grandmother    Hypertension Maternal Grandmother    Hyperlipidemia Maternal Grandmother    Heart disease Maternal Grandmother    Hypertension Sister    Cancer Maternal Grandfather        prostate   Diabetes Maternal Grandfather    Hyperlipidemia Maternal Grandfather    Hypertension Maternal Grandfather    Colon cancer Maternal Grandfather    Diabetes Sister    Diabetes Maternal Uncle    Allergies  Allergen Reactions   Lisinopril Cough      Review of Systems  Constitutional:  Negative for malaise/fatigue.  Eyes:  Negative for blurred vision.  Respiratory:  Negative for shortness of breath.   Cardiovascular:  Negative for chest pain, palpitations, orthopnea and PND.  Gastrointestinal: Negative.   Genitourinary: Negative.   Musculoskeletal: Negative.  Negative for neck pain.  Neurological: Negative.  Negative for headaches.  Psychiatric/Behavioral: Negative.        Objective:     BP (!) 153/92   Pulse 72   Temp 98.5 F (36.9 C)   Ht $R'5\' 7"'pF$  (1.702 m)   Wt 284 lb 9.6 oz (129.1 kg)   LMP 10/17/2016 (Exact Date)   SpO2 98%   BMI 44.57 kg/m   BP Readings from Last 3 Encounters:  11/02/21 (!) 153/92  05/19/21 (!) 177/99  05/07/21 (!) 157/94   Wt Readings from Last 3 Encounters:  11/02/21 284 lb 9.6 oz (129.1 kg)  05/07/21 277 lb (125.6 kg)  05/04/21 277 lb 9.6 oz (125.9 kg)      Physical Exam Constitutional:      Appearance: Normal appearance. She is obese.  Eyes:  Pupils: Pupils are equal, round, and reactive to light.  Cardiovascular:     Rate and Rhythm: Normal rate and regular rhythm.     Pulses: Normal pulses.  Pulmonary:     Effort: Pulmonary effort is normal.  Abdominal:     General: Bowel sounds are normal.  Musculoskeletal:        General: Normal range of motion.  Skin:    General: Skin is warm.  Neurological:     General: No focal deficit present.     Mental Status: She is alert and oriented to person, place, and time. Mental status is at baseline.  Psychiatric:        Mood and Affect: Mood normal.        Behavior: Behavior normal.        Thought Content: Thought content normal.        Judgment: Judgment normal.      No results found for any visits on 11/02/21.  Last CBC Lab Results  Component Value Date   WBC 5.5 01/26/2021   HGB 14.5 01/26/2021   HCT 42.1 01/26/2021   MCV 88 01/26/2021   MCH 30.3 01/26/2021   RDW 12.1 01/26/2021   PLT 271 82/50/5397   Last metabolic panel Lab Results  Component Value Date   GLUCOSE 91 11/02/2021   NA 142 11/02/2021   K 4.1 11/02/2021   CL 104 11/02/2021   CO2 22 11/02/2021   BUN 16 11/02/2021   CREATININE 1.42 (H) 11/02/2021   EGFR 45 (L) 11/02/2021   CALCIUM 9.7 11/02/2021   PHOS 2.9 11/15/2016   PROT 7.1 11/02/2021   ALBUMIN 4.3 11/02/2021   LABGLOB 2.8 02/18/2019   AGRATIO 1.4 02/18/2019   BILITOT 0.3 11/02/2021   ALKPHOS 85 11/02/2021   AST 16 11/02/2021   ALT 15 11/02/2021   ANIONGAP 9 05/06/2020   Last lipids Lab Results  Component Value Date   CHOL 170 08/11/2020   HDL 61 08/11/2020   LDLCALC 94 08/11/2020   TRIG 83  08/11/2020   CHOLHDL 2.8 08/11/2020   Last hemoglobin A1c Lab Results  Component Value Date   HGBA1C 5.7 (H) 11/02/2021   Last thyroid functions Lab Results  Component Value Date   TSH 1.910 01/26/2021   Last vitamin D Lab Results  Component Value Date   VD25OH 38.3 05/04/2021   Last vitamin B12 and Folate Lab Results  Component Value Date   QBHALPFX90 240 01/26/2021      The 10-year ASCVD risk score (Arnett DK, et al., 2019) is: 6%    Assessment & Plan:   Problem List Items Addressed This Visit       Cardiovascular and Mediastinum   Essential hypertension, benign - Primary   Relevant Medications   hydrochlorothiazide (HYDRODIURIL) 25 MG tablet   amLODipine (NORVASC) 5 MG tablet   Other Relevant Orders   CMP and Liver (Completed)     Other   Severe obesity (BMI >= 40) ([facility])   Other Visit Diagnoses     Diabetes mellitus screening       Relevant Orders   Hemoglobin A1c (Completed)   Need for Tdap vaccination       Relevant Orders   Tdap vaccine greater than or equal to 7yo IM (Completed)   Neuropathy       Relevant Medications   gabapentin (NEURONTIN) 300 MG capsule      1. Essential hypertension, benign Blood pressure has been poorly controlled on current blood pressure medication.  Discontinue triamterene hydrochlorothiazide.  Will initiate amlodipine 5 mg daily and hydrochlorothiazide 25 mg daily.  Patient will return to clinic for blood pressure check in 2 weeks.  Discussed the importance of taking medications consistently in order to achieve positive outcomes. Also, recommend a low-fat, low carbohydrate diet divided over small meals throughout the day.  Also increase physical intake to around 3 days/week / 30 minutes/day.  Low impact cardiovascular exercise. - CMP and Liver - hydrochlorothiazide (HYDRODIURIL) 25 MG tablet; Take 1 tablet (25 mg total) by mouth daily.  Dispense: 90 tablet; Refill: 1 - amLODipine (NORVASC) 5 MG tablet; Take 1 tablet (5  mg total) by mouth daily.  Dispense: 90 tablet; Refill: 1  2. Diabetes mellitus screening  - Hemoglobin A1c  3. Need for Tdap vaccination  - Tdap vaccine greater than or equal to 7yo IM  4. Neuropathy  - gabapentin (NEURONTIN) 300 MG capsule; Take 1 capsule (300 mg total) by mouth every 8 (eight) hours.  Dispense: 90 capsule; Refill: 3  5. Severe obesity (BMI >= 40) (HCC) The patient is asked to make an attempt to improve diet and exercise patterns to aid in medical management of this problem.   Return in about 3 months (around 02/02/2022) for hypertension.    [name]  APRN, MSN, FNP-C [facility] [address] [place], [place] [address] [phone]

## 2021-11-03 LAB — CMP AND LIVER
ALT: 15 IU/L (ref 0–32)
AST: 16 IU/L (ref 0–40)
Albumin: 4.3 g/dL (ref 3.8–4.9)
Alkaline Phosphatase: 85 IU/L (ref 44–121)
BUN: 16 mg/dL (ref 6–24)
Bilirubin Total: 0.3 mg/dL (ref 0.0–1.2)
Bilirubin, Direct: <0.1 mg/dL (ref 0.00–0.40)
CO2: 22 mmol/L (ref 20–29)
Calcium: 9.7 mg/dL (ref 8.7–10.2)
Chloride: 104 mmol/L (ref 96–106)
Creatinine, Ser: 1.42 mg/dL — ABNORMAL HIGH (ref 0.57–1.00)
Glucose: 91 mg/dL (ref 70–99)
Potassium: 4.1 mmol/L (ref 3.5–5.2)
Sodium: 142 mmol/L (ref 134–144)
Total Protein: 7.1 g/dL (ref 6.0–8.5)
eGFR: 45 mL/min/{1.73_m2} — ABNORMAL LOW (ref >59)

## 2021-11-03 LAB — HEMOGLOBIN A1C
Est. average glucose Bld gHb Est-mCnc: 117 mg/dL
Hgb A1c MFr Bld: 5.7 % — ABNORMAL HIGH (ref 4.8–5.6)

## 2021-11-11 NOTE — Progress Notes (Signed)
Please notify patient and let her know that her hemoglobin A1c is consistent with a diagnosis of prediabetes.  For 6 months, recommend a low-fat, low carbohydrate diet divided over small meals throughout the day.  Also, increase physical activity.  If A1c continues to be elevated, will consider adding medication in 6 months.  Creatinine, an indicator of kidney function and continues to be elevated and consistent with stage II chronic kidney disease.  No further work-up or specialist is warranted at this time.  We will continue to follow every 6 months.  Recommend keeping blood pressure within normal limits.  Follow-up in clinic as scheduled.  [name]  APRN, MSN, FNP-C [facility] [address] [place], [place] [address] [phone]

## 2022-02-08 ENCOUNTER — Encounter: Payer: Self-pay | Admitting: Family Medicine

## 2022-02-08 ENCOUNTER — Ambulatory Visit (INDEPENDENT_AMBULATORY_CARE_PROVIDER_SITE_OTHER): Payer: Self-pay | Admitting: Family Medicine

## 2022-02-08 VITALS — BP 136/74 | HR 75 | Temp 97.5°F | Ht 67.0 in | Wt 288.8 lb

## 2022-02-08 DIAGNOSIS — R7303 Prediabetes: Secondary | ICD-10-CM

## 2022-02-08 DIAGNOSIS — I10 Essential (primary) hypertension: Secondary | ICD-10-CM

## 2022-02-08 DIAGNOSIS — E559 Vitamin D deficiency, unspecified: Secondary | ICD-10-CM

## 2022-02-08 DIAGNOSIS — F418 Other specified anxiety disorders: Secondary | ICD-10-CM

## 2022-02-08 DIAGNOSIS — N182 Chronic kidney disease, stage 2 (mild): Secondary | ICD-10-CM

## 2022-02-08 DIAGNOSIS — G4709 Other insomnia: Secondary | ICD-10-CM

## 2022-02-08 MED ORDER — TRAZODONE HCL 50 MG PO TABS: 25.0000 mg | ORAL_TABLET | Freq: Every evening | ORAL | 3 refills | Status: DC | PRN

## 2022-02-08 NOTE — Progress Notes (Signed)
Patient [facility]   Established Patient Office Visit  Subjective   Patient ID: Abigail Beasley, female    DOB: 1970/12/30  Age: 51 y.o. MRN: [id number]  Chief Complaint  Patient presents with   Follow-up    Pt is here for follow up visit. Pt states she has chest pains it comes and goes for past 2 weeks both hands get numb and cold pt states gabapentin does not help pt would like a referral to a hand specialist    Hypertension This is a chronic problem. The problem is controlled. Associated symptoms include anxiety, chest pain and peripheral edema. Pertinent negatives include no malaise/fatigue, neck pain, orthopnea, PND or shortness of breath. Risk factors for coronary artery disease include dyslipidemia, obesity and sedentary lifestyle. Hypertensive end-organ damage includes kidney disease.  Anxiety Presents for follow-up visit. Symptoms include chest pain, insomnia, irritability and nervous/anxious behavior. Patient reports no panic, restlessness or shortness of breath.      Patient Active Problem List   Diagnosis Date Noted   Trichomonas vaginitis 08/15/2020   Status post laparoscopic hysterectomy 11/08/2016   Severe obesity (BMI >= 40) (HCC) 07/28/2014   Dizziness and giddiness 03/18/2013   Essential hypertension, benign 03/18/2013   Breast pain, left 03/18/2013   Past Medical History:  Diagnosis Date   Allergy    Elevated serum creatinine 01/2019   History of proteinuria syndrome 01/2019   Hypertension    Left flank pain 01/2019   PONV (postoperative nausea and vomiting)    Past Surgical History:  Procedure Laterality Date   ABDOMINAL HYSTERECTOMY     CYSTOSCOPY N/A 11/08/2016   Procedure: CYSTOSCOPY;  Surgeon: Salvadore Dom, MD;  Location: [facility];  Service: Gynecology;  Laterality: N/A;   DILATION AND EVACUATION     miscarriage   TOTAL LAPAROSCOPIC HYSTERECTOMY WITH SALPINGECTOMY Bilateral 11/08/2016   Procedure:  HYSTERECTOMY TOTAL LAPAROSCOPIC WITH BILATERAL SALPINGECTOMY;  Surgeon: Salvadore Dom, MD;  Location: [facility];  Service: Gynecology;  Laterality: Bilateral;   TUBAL LIGATION     Social History   Tobacco Use   Smoking status: Never   Smokeless tobacco: Never  Vaping Use   Vaping Use: Never used  Substance Use Topics   Alcohol use: Yes    Alcohol/week: 3.0 standard drinks of alcohol    Types: 3 Standard drinks or equivalent per week    Comment: occ   Drug use: Not Currently    Types: Marijuana    Comment: occ   Social History   Socioeconomic History   Marital status: Single    Spouse name: Not on file   Number of children: Not on file   Years of education: Not on file   Highest education level: Not on file  Occupational History   Not on file  Tobacco Use   Smoking status: Never   Smokeless tobacco: Never  Vaping Use   Vaping Use: Never used  Substance and Sexual Activity   Alcohol use: Yes    Alcohol/week: 3.0 standard drinks of alcohol    Types: 3 Standard drinks or equivalent per week    Comment: occ   Drug use: Not Currently    Types: Marijuana    Comment: occ   Sexual activity: Not Currently    Partners: Male    Birth control/protection: Surgical  Other Topics Concern   Not on file  Social History Narrative   Marital status: single; not dating.  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      Tobacco: none      Alcohol:  Socially; rarely.      Drugs: none; marijuana in the past.      Exercise: none      History of incarceration for six years (guilt by association; friend robbed bank).      Sexual activity:  Males; last STD Gonorrhea at age 23.  Syphilis in twenties.    Social Determinants of Health   Financial Resource Strain: Not on file  Food Insecurity: Not on file  Transportation Needs: Not on file  Physical Activity: Not on file  Stress: Not on file   Social Connections: Not on file  Intimate Partner Violence: Not on file   Family Status  Relation Name Status   Mother  Deceased at age 2       cancer liver   MGM  Alive   Sister  Alive   MGF  Deceased   Sister  Alive   Father  Alive       49   [place]  Deceased   Administrator  (Not Specified)   Family History  Problem Relation Age of Onset   Diabetes Mother    Hypertension Mother    Cancer Mother        liver    Hyperlipidemia Mother    Stroke Mother    Diabetes Maternal Grandmother    Hypertension Maternal Grandmother    Hyperlipidemia Maternal Grandmother    Heart disease Maternal Grandmother    Hypertension Sister    Cancer Maternal Grandfather        prostate   Diabetes Maternal Grandfather    Hyperlipidemia Maternal Grandfather    Hypertension Maternal Grandfather    Colon cancer Maternal Grandfather    Diabetes Sister    Diabetes Maternal Uncle    Allergies  Allergen Reactions   Lisinopril Cough      Review of Systems  Constitutional:  Positive for irritability. Negative for malaise/fatigue.  HENT: Negative.    Respiratory:  Negative for cough, sputum production and shortness of breath.   Cardiovascular:  Positive for chest pain. Negative for orthopnea and PND.  Gastrointestinal: Negative.   Genitourinary: Negative.   Musculoskeletal: Negative.  Negative for neck pain.  Skin: Negative.   Psychiatric/Behavioral:  The patient is nervous/anxious and has insomnia.       Objective:     BP 136/74 (BP Location: Left Arm, Patient Position: Sitting, Cuff Size: Large)   Pulse 75   Temp (!) 97.5 F (36.4 C)   Ht $R'5\' 7"'kw$  (1.702 m)   Wt 288 lb 12.8 oz (131 kg)   LMP 10/17/2016 (Exact Date)   SpO2 100%   BMI 45.23 kg/m  BP Readings from Last 3 Encounters:  02/08/22 136/74  11/02/21 (!) 153/92  05/19/21 (!) 177/99   Wt Readings from Last 3 Encounters:  02/08/22 288 lb 12.8 oz (131 kg)  11/02/21 284 lb 9.6 oz (129.1 kg)  05/07/21 277 lb (125.6 kg)       Physical Exam Constitutional:      Appearance: She is obese.  Eyes:     Pupils: Pupils are equal, round, and reactive to light.  Cardiovascular:     Rate and Rhythm: Normal rate and regular rhythm.     Pulses: Normal pulses.  Pulmonary:  Effort: Pulmonary effort is normal.  Abdominal:     General: Bowel sounds are normal.  Skin:    General: Skin is warm.  Neurological:     General: No focal deficit present.     Mental Status: Mental status is at baseline.  Psychiatric:        Mood and Affect: Mood normal.        Behavior: Behavior normal.        Thought Content: Thought content normal.        Judgment: Judgment normal.      No results found for any visits on 02/08/22.  Last CBC Lab Results  Component Value Date   WBC 5.5 01/26/2021   HGB 14.5 01/26/2021   HCT 42.1 01/26/2021   MCV 88 01/26/2021   MCH 30.3 01/26/2021   RDW 12.1 01/26/2021   PLT 271 55/73/2202   Last metabolic panel Lab Results  Component Value Date   GLUCOSE 91 11/02/2021   NA 142 11/02/2021   K 4.1 11/02/2021   CL 104 11/02/2021   CO2 22 11/02/2021   BUN 16 11/02/2021   CREATININE 1.42 (H) 11/02/2021   EGFR 45 (L) 11/02/2021   CALCIUM 9.7 11/02/2021   PHOS 2.9 11/15/2016   PROT 7.1 11/02/2021   ALBUMIN 4.3 11/02/2021   LABGLOB 2.8 02/18/2019   AGRATIO 1.4 02/18/2019   BILITOT 0.3 11/02/2021   ALKPHOS 85 11/02/2021   AST 16 11/02/2021   ALT 15 11/02/2021   ANIONGAP 9 05/06/2020   Last lipids Lab Results  Component Value Date   CHOL 170 08/11/2020   HDL 61 08/11/2020   LDLCALC 94 08/11/2020   TRIG 83 08/11/2020   CHOLHDL 2.8 08/11/2020   Last hemoglobin A1c Lab Results  Component Value Date   HGBA1C 5.7 (H) 11/02/2021   Last thyroid functions Lab Results  Component Value Date   TSH 1.910 01/26/2021   Last vitamin D Lab Results  Component Value Date   VD25OH 38.3 05/04/2021   Last vitamin B12 and Folate Lab Results  Component Value Date   [id number] 283  01/26/2021      The 10-year ASCVD risk score (Arnett DK, et al., 2019) is: 4.1%    Assessment & Plan:   Problem List Items Addressed This Visit       Cardiovascular and Mediastinum   Essential hypertension, benign - Primary   Relevant Orders   Comprehensive metabolic panel     Other   Severe obesity (BMI >= 40) ([place])   Other Visit Diagnoses     Stage 2 chronic kidney disease       Vitamin D deficiency       Prediabetes       Relevant Orders   Hemoglobin A1c   Other insomnia       Relevant Medications   traZODone (DESYREL) 50 MG tablet     1. Essential hypertension, benign BP 136/74 (BP Location: Left Arm, Patient Position: Sitting, Cuff Size: Large)   Pulse 75   Temp (!) 97.5 F (36.4 C)   Ht $R'5\' 7"'wo$  (1.702 m)   Wt 288 lb 12.8 oz (131 kg)   LMP 10/17/2016 (Exact Date)   SpO2 100%   BMI 45.23 kg/m  - Continue medication, monitor blood pressure at home. Continue DASH diet.  Reminder to go to the ER if any CP, SOB, nausea, dizziness, severe HA, changes vision/speech, left arm numbness and tingling and jaw pain. . - Comprehensive metabolic panel  2. Severe obesity (  BMI >= 40) ([place]) The patient is asked to make an attempt to improve diet and exercise patterns to aid in medical management of this problem.   3. Stage 2 chronic kidney disease Review CMP as results become available  4. Vitamin D deficiency Continue OTC vitamin D supplements and vitamin D fortified foods.   5. Prediabetes  - Hemoglobin A1c  6. Other insomnia  - traZODone (DESYREL) 50 MG tablet; Take 0.5 tablets (25 mg total) by mouth at bedtime as needed for sleep.  Dispense: 20 tablet; Refill: 3  7. Situational anxiety  - traZODone (DESYREL) 50 MG tablet; Take 0.5 tablets (25 mg total) by mouth at bedtime as needed for sleep.  Dispense: 20 tablet; Refill: 3   Return in about 3 months (around 05/10/2022).   [name]  APRN, MSN, FNP-C Patient [facility] [address] [place], [place] [address] [phone]

## 2022-02-08 NOTE — Patient Instructions (Signed)
Hypertension, Adult ?Hypertension is another name for high blood pressure. High blood pressure forces your heart to work harder to pump blood. This can cause problems over time. ?There are two numbers in a blood pressure reading. There is a top number (systolic) over a bottom number (diastolic). It is best to have a blood pressure that is below 120/80. ?What are the causes? ?The cause of this condition is not known. Some other conditions can lead to high blood pressure. ?What increases the risk? ?Some lifestyle factors can make you more likely to develop high blood pressure: ?Smoking. ?Not getting enough exercise or physical activity. ?Being overweight. ?Having too much fat, sugar, calories, or salt (sodium) in your diet. ?Drinking too much alcohol. ?Other risk factors include: ?Having any of these conditions: ?Heart disease. ?Diabetes. ?High cholesterol. ?Kidney disease. ?Obstructive sleep apnea. ?Having a family history of high blood pressure and high cholesterol. ?Age. The risk increases with age. ?Stress. ?What are the signs or symptoms? ?High blood pressure may not cause symptoms. Very high blood pressure (hypertensive crisis) may cause: ?Headache. ?Fast or uneven heartbeats (palpitations). ?Shortness of breath. ?Nosebleed. ?Vomiting or feeling like you may vomit (nauseous). ?Changes in how you see. ?Very bad chest pain. ?Feeling dizzy. ?Seizures. ?How is this treated? ?This condition is treated by making healthy lifestyle changes, such as: ?Eating healthy foods. ?Exercising more. ?Drinking less alcohol. ?Your doctor may prescribe medicine if lifestyle changes do not help enough and if: ?Your top number is above 130. ?Your bottom number is above 80. ?Your personal target blood pressure may vary. ?Follow these instructions at home: ?Eating and drinking ? ?If told, follow the DASH eating plan. To follow this plan: ?Fill one half of your plate at each meal with fruits and vegetables. ?Fill one fourth of your plate  at each meal with whole grains. Whole grains include whole-wheat pasta, brown rice, and whole-grain bread. ?Eat or drink low-fat dairy products, such as skim milk or low-fat yogurt. ?Fill one fourth of your plate at each meal with low-fat (lean) proteins. Low-fat proteins include fish, chicken without skin, eggs, beans, and tofu. ?Avoid fatty meat, cured and processed meat, or chicken with skin. ?Avoid pre-made or processed food. ?Limit the amount of salt in your diet to less than 1,500 mg each day. ?Do not drink alcohol if: ?Your doctor tells you not to drink. ?You are pregnant, may be pregnant, or are planning to become pregnant. ?If you drink alcohol: ?Limit how much you have to: ?0-1 drink a day for women. ?0-2 drinks a day for men. ?Know how much alcohol is in your drink. In the [place] one drink equals one 12 oz bottle of beer (355 mL), one 5 oz glass of wine (148 mL), or one 1? oz glass of hard liquor (44 mL). ?Lifestyle ? ?Work with your doctor to stay at a healthy weight or to lose weight. Ask your doctor what the best weight is for you. ?Get at least 30 minutes of exercise that causes your heart to beat faster (aerobic exercise) most days of the week. This may include walking, swimming, or biking. ?Get at least 30 minutes of exercise that strengthens your muscles (resistance exercise) at least 3 days a week. This may include lifting weights or doing Pilates. ?Do not smoke or use any products that contain nicotine or tobacco. If you need help quitting, ask your doctor. ?Check your blood pressure at home as told by your doctor. ?Keep all follow-up visits. ?Medicines ?Take over-the-counter and prescription medicines   only as told by your doctor. Follow directions carefully. ?Do not skip doses of blood pressure medicine. The medicine does not work as well if you skip doses. Skipping doses also puts you at risk for problems. ?Ask your doctor about side effects or reactions to medicines that you should watch  for. ?Contact a doctor if: ?You think you are having a reaction to the medicine you are taking. ?You have headaches that keep coming back. ?You feel dizzy. ?You have swelling in your ankles. ?You have trouble with your vision. ?Get help right away if: ?You get a very bad headache. ?You start to feel mixed up (confused). ?You feel weak or numb. ?You feel faint. ?You have very bad pain in your: ?Chest. ?Belly (abdomen). ?You vomit more than once. ?You have trouble breathing. ?These symptoms may be an emergency. Get help right away. Call 911. ?Do not wait to see if the symptoms will go away. ?Do not drive yourself to the hospital. ?Summary ?Hypertension is another name for high blood pressure. ?High blood pressure forces your heart to work harder to pump blood. ?For most people, a normal blood pressure is less than 120/80. ?Making healthy choices can help lower blood pressure. If your blood pressure does not get lower with healthy choices, you may need to take medicine. ?This information is not intended to replace advice given to you by your health care provider. Make sure you discuss any questions you have with your health care provider. ?Document Revised: 03/04/2021 Document Reviewed: 03/04/2021 ?Elsevier Patient Education ? 2023 Elsevier Inc. ? ?

## 2022-02-09 LAB — HEMOGLOBIN A1C
Est. average glucose Bld gHb Est-mCnc: 126 mg/dL
Hgb A1c MFr Bld: 6 % — ABNORMAL HIGH (ref 4.8–5.6)

## 2022-02-09 LAB — COMPREHENSIVE METABOLIC PANEL
ALT: 17 IU/L (ref 0–32)
AST: 17 IU/L (ref 0–40)
Albumin/Globulin Ratio: 1.3 (ref 1.2–2.2)
Albumin: 3.9 g/dL (ref 3.8–4.9)
Alkaline Phosphatase: 82 IU/L (ref 44–121)
BUN/Creatinine Ratio: 15 (ref 9–23)
BUN: 17 mg/dL (ref 6–24)
Bilirubin Total: 0.4 mg/dL (ref 0.0–1.2)
CO2: 24 mmol/L (ref 20–29)
Calcium: 9.4 mg/dL (ref 8.7–10.2)
Chloride: 106 mmol/L (ref 96–106)
Creatinine, Ser: 1.14 mg/dL — ABNORMAL HIGH (ref 0.57–1.00)
Globulin, Total: 2.9 g/dL (ref 1.5–4.5)
Glucose: 96 mg/dL (ref 70–99)
Potassium: 4 mmol/L (ref 3.5–5.2)
Sodium: 142 mmol/L (ref 134–144)
Total Protein: 6.8 g/dL (ref 6.0–8.5)
eGFR: 58 mL/min/{1.73_m2} — ABNORMAL LOW (ref >59)

## 2022-02-11 ENCOUNTER — Other Ambulatory Visit: Payer: Self-pay | Admitting: Family Medicine

## 2022-02-11 DIAGNOSIS — R7303 Prediabetes: Secondary | ICD-10-CM

## 2022-02-11 MED ORDER — METFORMIN HCL 500 MG PO TABS: 500.0000 mg | ORAL_TABLET | Freq: Every day | ORAL | 3 refills | Status: DC

## 2022-02-11 NOTE — Progress Notes (Signed)
Cameran Ahmed is a 51 year old female with a medical history significant for hypertension and prediabetes was evaluated in clinic on 02/08/2022.  At that time, patient was found to have a hemoglobin A1c of 6.0, which is consistent with prediabetes.  This number has increased from previous.  At this juncture, we will start metformin 500 mg daily with breakfast.  We will recheck hemoglobin A1c in 3 months.  Please ensure that patient has a 10-monthfollow-up scheduled.  Patient has a history of chronic kidney disease and is serum creatinine is much improved at 1.14.  Recommend a low-fat, low carbohydrate diet divided over small meals throughout the day.  Also, increase physical activity, recommend low impact cardiovascular exercise, 30 minutes, 3 times per week.   LDonia Pounds APRN, MSN, FNP-C Patient [facility] [place] [phone]

## 2022-03-25 ENCOUNTER — Ambulatory Visit (INDEPENDENT_AMBULATORY_CARE_PROVIDER_SITE_OTHER): Payer: Self-pay | Admitting: Orthopaedic Surgery

## 2022-03-25 ENCOUNTER — Ambulatory Visit (INDEPENDENT_AMBULATORY_CARE_PROVIDER_SITE_OTHER): Payer: Self-pay

## 2022-03-25 DIAGNOSIS — S92354A Nondisplaced fracture of fifth metatarsal bone, right foot, initial encounter for closed fracture: Secondary | ICD-10-CM

## 2022-03-25 NOTE — Progress Notes (Signed)
Chief Complaint: right foot pain     History of Present Illness:   03/25/2022: Today for continued follow-up of her fifth metatarsal fracture.  Unfortunately at this time she continues to have pain with walking and is experiencing swelling at the end of a long day of work.  This continues to be symptomatic on uneven ground.  He is here today for further assessment.  She states that she is very limited in certain shoe usage.   Abigail Beasley is a 51 y.o. female with right foot pain after taking a statin.  She immediately felt a pop in the right base of the fifth metatarsal.  Since that time she has difficulty putting weight on the foot.  She presented to the emergency room.  She was placed in a splint and made nonweightbearing.  She was told to follow-up here.  She denies any previous history of fractures or bony injuries.  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    Surgical History:   None  PMH/PSH/Family History/Social History/Meds/Allergies:    Past Medical History:  Diagnosis Date   Allergy    Elevated serum creatinine 01/2019   History of proteinuria syndrome 01/2019   Hypertension    Left flank pain 01/2019   PONV (postoperative nausea and vomiting)    Past Surgical History:  Procedure Laterality Date   ABDOMINAL HYSTERECTOMY     CYSTOSCOPY N/A 11/08/2016   Procedure: CYSTOSCOPY;  Surgeon: Salvadore Dom, MD;  Location: [facility];  Service: Gynecology;  Laterality: N/A;   DILATION AND EVACUATION     miscarriage   TOTAL LAPAROSCOPIC HYSTERECTOMY WITH SALPINGECTOMY Bilateral 11/08/2016   Procedure: HYSTERECTOMY TOTAL LAPAROSCOPIC WITH BILATERAL SALPINGECTOMY;  Surgeon: Salvadore Dom, MD;  Location: [facility];  Service: Gynecology;  Laterality: Bilateral;   TUBAL LIGATION     Social History   Socioeconomic History   Marital status: Single    Spouse name: Not on file    Number of children: Not on file   Years of education: Not on file   Highest education level: Not on file  Occupational History   Not on file  Tobacco Use   Smoking status: Never   Smokeless tobacco: Never  Vaping Use   Vaping Use: Never used  Substance and Sexual Activity   Alcohol use: Yes    Alcohol/week: 3.0 standard drinks of alcohol    Types: 3 Standard drinks or equivalent per week    Comment: occ   Drug use: Not Currently    Types: Marijuana    Comment: occ   Sexual activity: Not Currently    Partners: Male    Birth control/protection: Surgical  Other Topics Concern   Not on file  Social History Narrative   Marital status: single; not dating.      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      Tobacco: none      Alcohol:  Socially; rarely.      Drugs: none; marijuana in the past.      Exercise: none      History of incarceration for six years (guilt by  association; friend robbed bank).      Sexual activity:  Males; last STD Gonorrhea at age 27.  Syphilis in twenties.    Social Determinants of Health   Financial Resource Strain: Not on file  Food Insecurity: Not on file  Transportation Needs: Not on file  Physical Activity: Not on file  Stress: Not on file  Social Connections: Not on file   Family History  Problem Relation Age of Onset   Diabetes Mother    Hypertension Mother    Cancer Mother        liver    Hyperlipidemia Mother    Stroke Mother    Diabetes Maternal Grandmother    Hypertension Maternal Grandmother    Hyperlipidemia Maternal Grandmother    Heart disease Maternal Grandmother    Hypertension Sister    Cancer Maternal Grandfather        prostate   Diabetes Maternal Grandfather    Hyperlipidemia Maternal Grandfather    Hypertension Maternal Grandfather    Colon cancer Maternal Grandfather    Diabetes Sister    Diabetes Maternal Uncle     Allergies  Allergen Reactions   Lisinopril Cough   Current Outpatient Medications  Medication Sig Dispense Refill   amLODipine (NORVASC) 5 MG tablet Take 1 tablet (5 mg total) by mouth daily. 90 tablet 1   ergocalciferol (VITAMIN D2) 1.25 MG (50000 UT) capsule Take 1 capsule (50,000 Units total) by mouth once a week. 12 capsule 1   gabapentin (NEURONTIN) 300 MG capsule Take 1 capsule (300 mg total) by mouth every 8 (eight) hours. 90 capsule 3   hydrochlorothiazide (HYDRODIURIL) 25 MG tablet Take 1 tablet (25 mg total) by mouth daily. 90 tablet 1   HYDROcodone-acetaminophen (NORCO/VICODIN) 5-325 MG tablet Take 1 tablet by mouth every 8 (eight) hours as needed. (Patient not taking: Reported on 11/02/2021) 12 tablet 0   loratadine (CLARITIN) 10 MG tablet Take 1 tablet (10 mg total) by mouth daily. (Patient not taking: Reported on 02/08/2022) 30 tablet 11   meloxicam (MOBIC) 15 MG tablet Take 1 tablet (15 mg total) by mouth daily. 30 tablet 3   metFORMIN (GLUCOPHAGE) 500 MG tablet Take 1 tablet (500 mg total) by mouth daily with breakfast. 180 tablet 3   Multiple Vitamin (MULTIVITAMIN WITH MINERALS) TABS tablet Take 1 tablet by mouth 4 (four) times a week.     traZODone (DESYREL) 50 MG tablet Take 0.5 tablets (25 mg total) by mouth at bedtime as needed for sleep. 20 tablet 3   No current facility-administered medications for this visit.   No results found.  Review of Systems:   A ROS was performed including pertinent positives and negatives as documented in the HPI.  Physical Exam :   Constitutional: NAD and appears stated age Neurological: Alert and oriented Psych: Appropriate affect and cooperative Last menstrual period 10/17/2016.   Comprehensive Musculoskeletal Exam:    She is having tenderness today that is predominantly about the distal aspect of the fifth metatarsal not necessarily at the fracture site.  Swelling is much improved.  She is able to extend and flex all of her toes as  well as her ankle.  2+ dorsalis pedis pulse.  Imaging:   Xray (3 views right foot): Zone 1, 5 metatarsal base fracture with 2 to 3 mm of displacement, unfortunately this appears to have gone on to nonunion   I personally reviewed and interpreted the radiographs.   Assessment:   51 year old female with right fifth metatarsal base  fracture in zone 1 which unfortunately appears to have gone on to nonunion.  At this time this remains quite symptomatic for her.  To that effect I do believe that she potentially could benefit from discussion of excision and tendon advancement versus repair.  I will plan to refer her for a foot and ankle evaluation to see if she would be a candidate for this. Plan :    Return to clinic as needed     I personally saw and evaluated the patient, and participated in the management and treatment plan.  Vanetta Mulders, MD Attending Physician, Orthopedic Surgery  This document was dictated using Dragon voice recognition software. A reasonable attempt at proof reading has been made to minimize errors.

## 2022-04-12 ENCOUNTER — Other Ambulatory Visit: Payer: Self-pay

## 2022-04-12 ENCOUNTER — Other Ambulatory Visit (HOSPITAL_BASED_OUTPATIENT_CLINIC_OR_DEPARTMENT_OTHER): Payer: Self-pay | Admitting: Orthopaedic Surgery

## 2022-04-12 DIAGNOSIS — S92354A Nondisplaced fracture of fifth metatarsal bone, right foot, initial encounter for closed fracture: Secondary | ICD-10-CM

## 2022-05-07 ENCOUNTER — Other Ambulatory Visit: Payer: Self-pay | Admitting: Family Medicine

## 2022-05-07 DIAGNOSIS — G629 Polyneuropathy, unspecified: Secondary | ICD-10-CM

## 2022-05-10 NOTE — Telephone Encounter (Signed)
[facility] is requesting gabapentin. Please advise. [place]

## 2022-05-11 ENCOUNTER — Telehealth: Payer: Self-pay | Admitting: Family Medicine

## 2022-05-11 ENCOUNTER — Other Ambulatory Visit: Payer: Self-pay | Admitting: Family Medicine

## 2022-05-11 DIAGNOSIS — I10 Essential (primary) hypertension: Secondary | ICD-10-CM

## 2022-05-11 MED ORDER — HYDROCHLOROTHIAZIDE 25 MG PO TABS: 25.0000 mg | ORAL_TABLET | Freq: Every day | ORAL | 1 refills | Status: DC

## 2022-05-11 MED ORDER — AMLODIPINE BESYLATE 5 MG PO TABS: 5.0000 mg | ORAL_TABLET | Freq: Every day | ORAL | 1 refills | Status: DC

## 2022-05-11 MED ORDER — MELOXICAM 15 MG PO TABS: 15.0000 mg | ORAL_TABLET | Freq: Every day | ORAL | 3 refills | Status: DC

## 2022-05-11 NOTE — Progress Notes (Signed)
Meds ordered this encounter  Medications   hydrochlorothiazide (HYDRODIURIL) 25 MG tablet    Sig: Take 1 tablet (25 mg total) by mouth daily.    Dispense:  90 tablet    Refill:  1    Order Specific Question:   Supervising Provider    Answer:   Tresa Garter [[id number]]   amLODipine (NORVASC) 5 MG tablet    Sig: Take 1 tablet (5 mg total) by mouth daily.    Dispense:  90 tablet    Refill:  1    Order Specific Question:   Supervising Provider    Answer:   Tresa Garter [[id number]]   meloxicam (MOBIC) 15 MG tablet    Sig: Take 1 tablet (15 mg total) by mouth daily.    Dispense:  30 tablet    Refill:  3    Order Specific Question:   Supervising Provider    Answer:   Tresa Garter [id number]

## 2022-05-11 NOTE — Telephone Encounter (Signed)
Caller & Relationship to patient: pt  MRN #  [id number]   Call Back Number: [phone]  Date of Last Office Visit: 05/07/2022     Date of Next Office Visit: 06/21/2022    Medication(s) to be Refilled: amlodipine, hydrochlorothiazide, meloxicam       Preferred Pharmacy: [facility] on [address]   ** Please notify patient to allow 48-72 hours to process** **Let patient know to contact pharmacy at the end of the day to make sure medication is ready. ** **If patient has not been seen in a year or longer, book an appointment **Advise to use MyChart for refill requests OR to contact their pharmacy

## 2022-05-12 ENCOUNTER — Other Ambulatory Visit: Payer: Self-pay | Admitting: Family Medicine

## 2022-05-12 ENCOUNTER — Other Ambulatory Visit: Payer: Self-pay

## 2022-05-12 DIAGNOSIS — I10 Essential (primary) hypertension: Secondary | ICD-10-CM

## 2022-05-12 MED ORDER — AMLODIPINE BESYLATE 5 MG PO TABS
5.0000 mg | ORAL_TABLET | Freq: Every day | ORAL | 0 refills | Status: DC
Start: 2022-05-12 — End: 2023-01-27

## 2022-05-12 MED ORDER — HYDROCHLOROTHIAZIDE 25 MG PO TABS: 25.0000 mg | ORAL_TABLET | Freq: Every day | ORAL | 0 refills | Status: DC

## 2022-05-12 MED ORDER — MELOXICAM 15 MG PO TABS: 15.0000 mg | ORAL_TABLET | Freq: Every day | ORAL | 3 refills | Status: DC

## 2022-05-12 NOTE — Telephone Encounter (Signed)
The amlodipine and hctz was sent in. Please advise if you will fill the meloxicam.

## 2022-05-12 NOTE — Progress Notes (Signed)
Meds ordered this encounter  Medications   meloxicam (MOBIC) 15 MG tablet    Sig: Take 1 tablet (15 mg total) by mouth daily.    Dispense:  30 tablet    Refill:  3    Order Specific Question:   Supervising Provider    Answer:   Tresa Garter [[id number]]   [name]  APRN, MSN, FNP-C Patient [facility] [place] [facility] [address] [place], [place] [address] [phone]

## 2022-05-17 ENCOUNTER — Ambulatory Visit: Payer: Self-pay | Admitting: Family Medicine

## 2022-06-02 ENCOUNTER — Ambulatory Visit: Payer: Self-pay | Admitting: Orthopedic Surgery

## 2022-06-13 ENCOUNTER — Other Ambulatory Visit: Payer: Self-pay | Admitting: Family Medicine

## 2022-06-13 DIAGNOSIS — G629 Polyneuropathy, unspecified: Secondary | ICD-10-CM

## 2022-06-16 DIAGNOSIS — Z1231 Encounter for screening mammogram for malignant neoplasm of breast: Secondary | ICD-10-CM

## 2022-06-21 ENCOUNTER — Ambulatory Visit (INDEPENDENT_AMBULATORY_CARE_PROVIDER_SITE_OTHER): Payer: Self-pay | Admitting: Family Medicine

## 2022-06-21 ENCOUNTER — Encounter: Payer: Self-pay | Admitting: Family Medicine

## 2022-06-21 VITALS — BP 139/80 | HR 68 | Temp 97.5°F | Ht 67.0 in | Wt 282.6 lb

## 2022-06-21 DIAGNOSIS — I10 Essential (primary) hypertension: Secondary | ICD-10-CM

## 2022-06-24 ENCOUNTER — Encounter: Payer: Self-pay | Admitting: Family Medicine

## 2022-06-24 ENCOUNTER — Ambulatory Visit (INDEPENDENT_AMBULATORY_CARE_PROVIDER_SITE_OTHER): Payer: Self-pay | Admitting: Family Medicine

## 2022-06-24 VITALS — BP 146/76 | HR 81 | Temp 97.0°F | Wt 287.2 lb

## 2022-06-24 DIAGNOSIS — N182 Chronic kidney disease, stage 2 (mild): Secondary | ICD-10-CM

## 2022-06-24 DIAGNOSIS — Z1211 Encounter for screening for malignant neoplasm of colon: Secondary | ICD-10-CM

## 2022-06-24 DIAGNOSIS — E559 Vitamin D deficiency, unspecified: Secondary | ICD-10-CM

## 2022-06-24 DIAGNOSIS — I10 Essential (primary) hypertension: Secondary | ICD-10-CM

## 2022-06-24 DIAGNOSIS — R7303 Prediabetes: Secondary | ICD-10-CM

## 2022-06-24 DIAGNOSIS — M79605 Pain in left leg: Secondary | ICD-10-CM

## 2022-06-24 LAB — POCT URINALYSIS DIPSTICK
Bilirubin, UA: NEGATIVE
Blood, UA: NEGATIVE
Glucose, UA: NEGATIVE
Ketones, UA: NEGATIVE
Leukocytes, UA: NEGATIVE
Nitrite, UA: NEGATIVE
Protein, UA: POSITIVE — AB
Spec Grav, UA: >=1.03 — AB (ref 1.010–1.025)
Urobilinogen, UA: 1 E.U./dL
pH, UA: 5.5 (ref 5.0–8.0)

## 2022-06-24 LAB — POCT GLYCOSYLATED HEMOGLOBIN (HGB A1C): Hemoglobin A1C: 5.7 % — AB (ref 4.0–5.6)

## 2022-06-24 MED ORDER — LIDOCAINE 5 % EX PTCH: 1.0000 | MEDICATED_PATCH | CUTANEOUS | 0 refills | Status: DC

## 2022-06-24 NOTE — Patient Instructions (Signed)
Your BMI is 44.98 and current weight is 287, which is increased by 10 pounds since 2022.  You have a diagnosis of prediabetes, your hemoglobin A1c is 5.7.  The range of prediabetes is 5.7-6.4. Recommend a carbohydrate modified diet divided over small meals throughout the day as discussed.  For prediabetes, recommend that you continue metformin daily with breakfast.  We discussed side effects.   Ordered lidocaine patches for right leg.  Recommend over-the-counter extra strength Tylenol as directed for pain.  Due to your chronic kidney disease, refrain from naproxen, ibuprofen, or meloxicam.   Will complete your labs today and follow-up by phone with any abnormal lab results.   Your blood pressure is a little bit above goal at 146/76.  Recommend that you follow this carbohydrate modified diet.  Your blood pressure may improve with some weight loss.  We will continue the same medication regimen.

## 2022-06-24 NOTE — Progress Notes (Signed)
Established Patient Office Visit  Subjective   Patient ID: Abigail Beasley, female    DOB: Oct 04, 1970  Age: 52 y.o. MRN: [id number]  Chief Complaint  Patient presents with   Follow-up    For b/p    Abigail Beasley is a very pleasant 52 year old female with a medical history significant for essential hypertension, obesity, prediabetes and vitamin D deficiency presents for a 3 month follow up of chronic conditions.   Hypertension This is a chronic problem. The problem is controlled. Associated symptoms include peripheral edema. Pertinent negatives include no anxiety, blurred vision, chest pain, headaches, neck pain, orthopnea, palpitations, PND, shortness of breath or sweats. Risk factors for coronary artery disease include obesity and sedentary lifestyle. Compliance problems include diet.  Hypertensive end-organ damage includes kidney disease. There is no history of CVA, heart failure or left ventricular hypertrophy.  Leg Pain  There was no injury mechanism. The pain is present in the right leg. The quality of the pain is described as aching. The pain is at a severity of 4/10. The pain is mild. The pain has been Intermittent since onset. Associated symptoms include an inability to bear weight. Pertinent negatives include no loss of motion, loss of sensation, muscle weakness, numbness or tingling. The symptoms are aggravated by weight bearing. She has tried NSAIDs and immobilization for the symptoms. The treatment provided mild relief.    Patient Active Problem List   Diagnosis Date Noted   Trichomonas vaginitis 08/15/2020   Status post laparoscopic hysterectomy 11/08/2016   Severe obesity (BMI >= 40) (HCC) 07/28/2014   Dizziness and giddiness 03/18/2013   Essential hypertension, benign 03/18/2013   Breast pain, left 03/18/2013   Past Medical History:  Diagnosis Date   Allergy    Elevated serum creatinine 01/2019   History of proteinuria syndrome 01/2019   Hypertension    Left flank pain  01/2019   PONV (postoperative nausea and vomiting)    Past Surgical History:  Procedure Laterality Date   ABDOMINAL HYSTERECTOMY     CYSTOSCOPY N/A 11/08/2016   Procedure: CYSTOSCOPY;  Surgeon: Salvadore Dom, MD;  Location: [facility];  Service: Gynecology;  Laterality: N/A;   DILATION AND EVACUATION     miscarriage   TOTAL LAPAROSCOPIC HYSTERECTOMY WITH SALPINGECTOMY Bilateral 11/08/2016   Procedure: HYSTERECTOMY TOTAL LAPAROSCOPIC WITH BILATERAL SALPINGECTOMY;  Surgeon: Salvadore Dom, MD;  Location: [facility];  Service: Gynecology;  Laterality: Bilateral;   TUBAL LIGATION     Social History   Tobacco Use   Smoking status: Never   Smokeless tobacco: Never  Vaping Use   Vaping Use: Never used  Substance Use Topics   Alcohol use: Yes    Alcohol/week: 3.0 standard drinks of alcohol    Types: 3 Standard drinks or equivalent per week    Comment: occ   Drug use: Not Currently    Types: Marijuana    Comment: occ   Social History   Socioeconomic History   Marital status: Single    Spouse name: Not on file   Number of children: Not on file   Years of education: Not on file   Highest education level: Not on file  Occupational History   Not on file  Tobacco Use   Smoking status: Never   Smokeless tobacco: Never  Vaping Use   Vaping Use: Never used  Substance and Sexual Activity   Alcohol use: Yes    Alcohol/week: 3.0 standard drinks of alcohol    Types: 3 Standard  drinks or equivalent per week    Comment: occ   Drug use: Not Currently    Types: Marijuana    Comment: occ   Sexual activity: Not Currently    Partners: Male    Birth control/protection: Surgical  Other Topics Concern   Not on file  Social History Narrative   Marital status: single; not dating.      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      Tobacco: none      Alcohol:  Socially; rarely.      Drugs: none;  marijuana in the past.      Exercise: none      History of incarceration for six years (guilt by association; friend robbed bank).      Sexual activity:  Males; last STD Gonorrhea at age 14.  Syphilis in twenties.    Social Determinants of Health   Financial Resource Strain: Not on file  Food Insecurity: Not on file  Transportation Needs: Not on file  Physical Activity: Not on file  Stress: Not on file  Social Connections: Not on file  Intimate Partner Violence: Not on file   Family Status  Relation Name Status   Mother  Deceased at age 16       cancer liver   MGM  Alive   Sister  Alive   MGF  Deceased   Sister  Alive   Father  Alive       30   [place]  Deceased   Administrator  (Not Specified)   Family History  Problem Relation Age of Onset   Diabetes Mother    Hypertension Mother    Cancer Mother        liver    Hyperlipidemia Mother    Stroke Mother    Diabetes Maternal Grandmother    Hypertension Maternal Grandmother    Hyperlipidemia Maternal Grandmother    Heart disease Maternal Grandmother    Hypertension Sister    Cancer Maternal Grandfather        prostate   Diabetes Maternal Grandfather    Hyperlipidemia Maternal Grandfather    Hypertension Maternal Grandfather    Colon cancer Maternal Grandfather    Diabetes Sister    Diabetes Maternal Uncle    Allergies  Allergen Reactions   Lisinopril Cough      Review of Systems  Constitutional: Negative.   HENT: Negative.    Eyes: Negative.  Negative for blurred vision.  Respiratory: Negative.  Negative for shortness of breath.   Cardiovascular: Negative.  Negative for chest pain, palpitations, orthopnea and PND.  Gastrointestinal: Negative.   Genitourinary: Negative.   Musculoskeletal:  Positive for back pain and joint pain. Negative for neck pain.  Skin: Negative.   Neurological: Negative.  Negative for tingling, numbness and headaches.  Psychiatric/Behavioral: Negative.        Objective:     BP (!)  146/76   Pulse 81   Temp (!) 97 F (36.1 C)   Wt 287 lb 3.2 oz (130.3 kg)   LMP 10/17/2016 (Exact Date)   SpO2 98%   BMI 44.98 kg/m  BP Readings from Last 3 Encounters:  06/24/22 (!) 146/76  06/21/22 139/80  02/08/22 136/74   Wt Readings from Last 3 Encounters:  06/24/22 287 lb 3.2 oz (130.3 kg)  06/21/22 282 lb 9.6 oz (128.2 kg)  02/08/22 288  lb 12.8 oz (131 kg)      Physical Exam Constitutional:      Appearance: Normal appearance.  Eyes:     Pupils: Pupils are equal, round, and reactive to light.  Cardiovascular:     Rate and Rhythm: Normal rate and regular rhythm.     Pulses: Normal pulses.  Pulmonary:     Effort: Pulmonary effort is normal.  Skin:    General: Skin is warm.  Neurological:     General: No focal deficit present.     Mental Status: She is alert. Mental status is at baseline.  Psychiatric:        Mood and Affect: Mood normal.        Behavior: Behavior normal.        Thought Content: Thought content normal.        Judgment: Judgment normal.      No results found for any visits on 06/24/22.  Last CBC Lab Results  Component Value Date   WBC 5.5 01/26/2021   HGB 14.5 01/26/2021   HCT 42.1 01/26/2021   MCV 88 01/26/2021   MCH 30.3 01/26/2021   RDW 12.1 01/26/2021   PLT 271 11/20/7626   Last metabolic panel Lab Results  Component Value Date   GLUCOSE 96 02/08/2022   NA 142 02/08/2022   K 4.0 02/08/2022   CL 106 02/08/2022   CO2 24 02/08/2022   BUN 17 02/08/2022   CREATININE 1.14 (H) 02/08/2022   EGFR 58 (L) 02/08/2022   CALCIUM 9.4 02/08/2022   PHOS 2.9 11/15/2016   PROT 6.8 02/08/2022   ALBUMIN 3.9 02/08/2022   LABGLOB 2.9 02/08/2022   AGRATIO 1.3 02/08/2022   BILITOT 0.4 02/08/2022   ALKPHOS 82 02/08/2022   AST 17 02/08/2022   ALT 17 02/08/2022   ANIONGAP 9 05/06/2020   Last lipids Lab Results  Component Value Date   CHOL 170 08/11/2020   HDL 61 08/11/2020   LDLCALC 94 08/11/2020   TRIG 83 08/11/2020   CHOLHDL 2.8  08/11/2020   Last hemoglobin A1c Lab Results  Component Value Date   HGBA1C 6.0 (H) 02/08/2022   Last thyroid functions Lab Results  Component Value Date   TSH 1.910 01/26/2021   Last vitamin D Lab Results  Component Value Date   VD25OH 38.3 05/04/2021   Last vitamin B12 and Folate Lab Results  Component Value Date   BTDVVOHY07 371 01/26/2021      The 10-year ASCVD risk score (Arnett DK, et al., 2019) is: 5.3%    Assessment & Plan:   Problem List Items Addressed This Visit       Cardiovascular and Mediastinum   Essential hypertension, benign - Primary   Relevant Orders   Basic Metabolic Panel   Urinalysis Dipstick     Other   Severe obesity (BMI >= 40) ([place])   Other Visit Diagnoses     Prediabetes       Relevant Orders   Basic Metabolic Panel   POCT glycosylated hemoglobin (Hb A1C)   Vitamin D deficiency       Relevant Orders   Vitamin D, 25-hydroxy   Stage 2 chronic kidney disease       Colon cancer screening       Relevant Orders   Cologuard   Left leg pain       Relevant Medications   lidocaine (LIDODERM) 5 %     1. Essential hypertension, benign  Continue medication, monitor blood pressure at home. Continue  DASH diet.  Reminder to go to the ER if any CP, SOB, nausea, dizziness, severe HA, changes vision/speech, left arm numbness and tingling and jaw pain.   - Basic Metabolic Panel - Urinalysis Dipstick  2. Prediabetes Hemoglobin A1c is 5.7.  Discussed prediabetes at length.  Patient will restart metformin 500 mg daily. - Basic Metabolic Panel - POCT glycosylated hemoglobin (Hb A1C)  3. Vitamin D deficiency  - Vitamin D, 25-hydroxy  4. Stage 2 chronic kidney disease Will repeat BMP and follow-up by phone with lab results.  5. Severe obesity (BMI >= 40) (HCC) Filed Weights   06/24/22 0957  Weight: 287 lb 3.2 oz (130.3 kg)     6. Colon cancer screening  - Cologuard  7. Left leg pain Recommend over-the-counter extra strength  Tylenol 1000 mg every 6 hours as needed - lidocaine (LIDODERM) 5 %; Place 1 patch onto the skin daily. Remove & Discard patch within 12 hours or as directed by MD  Dispense: 30 patch; Refill: 0   Return in about 3 months (around 09/23/2022) for hypertension, obesity.  [name]  APRN, MSN, FNP-C Patient [facility] [address] [place], [place] [address] [phone]

## 2022-06-26 NOTE — Progress Notes (Signed)
Patient left prior to evaluation.   [name]  APRN, MSN, FNP-C Patient [facility] [address] [place], [place] [address] [phone]

## 2022-06-28 LAB — BASIC METABOLIC PANEL
BUN/Creatinine Ratio: 13 (ref 9–23)
BUN: 18 mg/dL (ref 6–24)
CO2: 17 mmol/L — ABNORMAL LOW (ref 20–29)
Calcium: 9.6 mg/dL (ref 8.7–10.2)
Chloride: 104 mmol/L (ref 96–106)
Creatinine, Ser: 1.39 mg/dL — ABNORMAL HIGH (ref 0.57–1.00)
Glucose: 98 mg/dL (ref 70–99)
Potassium: 4.4 mmol/L (ref 3.5–5.2)
Sodium: 144 mmol/L (ref 134–144)
eGFR: 45 mL/min/{1.73_m2} — ABNORMAL LOW (ref >59)

## 2022-06-28 LAB — VITAMIN D 25 HYDROXY (VIT D DEFICIENCY, FRACTURES)

## 2022-06-30 ENCOUNTER — Inpatient Hospital Stay: Admission: RE | Admit: 2022-06-30 | Payer: Self-pay | Source: Ambulatory Visit

## 2022-07-01 NOTE — Progress Notes (Signed)
Left message for patient to return call to office at [phone].

## 2022-07-07 ENCOUNTER — Ambulatory Visit
Admission: RE | Admit: 2022-07-07 | Discharge: 2022-07-07 | Disposition: A | Discharge status: Home / Self Care | Payer: No Typology Code available for payment source | Source: Ambulatory Visit | Attending: Family Medicine | Admitting: Family Medicine

## 2022-07-07 DIAGNOSIS — Z1231 Encounter for screening mammogram for malignant neoplasm of breast: Secondary | ICD-10-CM

## 2022-07-26 ENCOUNTER — Telehealth: Payer: Self-pay | Admitting: Family Medicine

## 2022-07-26 NOTE — Telephone Encounter (Signed)
Caller & Relationship to patient:  MRN #  [id number]   Call Back Number:   Date of Last Office Visit: 06/24/2022     Date of Next Office Visit: 09/27/2022    Medication(s) to be Refilled: Prednisone   Preferred Pharmacy:   ** Please notify patient to allow 48-72 hours to process** **Let patient know to contact pharmacy at the end of the day to make sure medication is ready. ** **If patient has not been seen in a year or longer, book an appointment **Advise to use MyChart for refill requests OR to contact their pharmacy

## 2022-07-27 ENCOUNTER — Other Ambulatory Visit: Payer: Self-pay | Admitting: Family Medicine

## 2022-07-27 DIAGNOSIS — G629 Polyneuropathy, unspecified: Secondary | ICD-10-CM

## 2022-07-28 NOTE — Telephone Encounter (Signed)
Caller & Relationship to patient:  MRN #  [id number]   Call Back Number:   Date of Last Office Visit: 07/26/2022     Date of Next Office Visit: 09/27/2022    Medication(s) to be Refilled: gabpentin  Preferred Pharmacy:   ** Please notify patient to allow 48-72 hours to process** **Let patient know to contact pharmacy at the end of the day to make sure medication is ready. ** **If patient has not been seen in a year or longer, book an appointment **Advise to use MyChart for refill requests OR to contact their pharmacy

## 2022-07-29 NOTE — Telephone Encounter (Signed)
Lvm for pt to call back. kh

## 2022-08-02 ENCOUNTER — Telehealth: Payer: Self-pay

## 2022-08-02 NOTE — Telephone Encounter (Signed)
Pt called and advised she has been taking prednisone for her legs. She advised that it work and allows her to move around. Pt also advised she only has 6 days left and would like for you to send in a script for some more.

## 2022-08-03 ENCOUNTER — Telehealth: Payer: Self-pay

## 2022-08-03 ENCOUNTER — Other Ambulatory Visit: Payer: Self-pay | Admitting: Family Medicine

## 2022-08-03 NOTE — Telephone Encounter (Signed)
Done KH 

## 2022-08-03 NOTE — Telephone Encounter (Signed)
Pt is requesting to have  a script for 800 mg ibuprofen. Please advise if you will fill. Thank you . Mackey

## 2022-08-03 NOTE — Telephone Encounter (Signed)
Pt was advised Abigail Beasley 

## 2022-08-09 ENCOUNTER — Ambulatory Visit: Payer: Self-pay | Admitting: Family Medicine

## 2022-09-19 ENCOUNTER — Other Ambulatory Visit: Payer: Self-pay | Admitting: Family Medicine

## 2022-09-19 DIAGNOSIS — M79605 Pain in left leg: Secondary | ICD-10-CM

## 2022-09-19 NOTE — Telephone Encounter (Signed)
Please advise KH 

## 2022-09-27 ENCOUNTER — Ambulatory Visit: Payer: Self-pay | Admitting: Family Medicine

## 2022-09-29 ENCOUNTER — Ambulatory Visit: Payer: Self-pay | Admitting: Family Medicine

## 2022-10-31 ENCOUNTER — Other Ambulatory Visit: Payer: Self-pay

## 2022-10-31 ENCOUNTER — Other Ambulatory Visit (HOSPITAL_COMMUNITY): Payer: Self-pay

## 2022-10-31 NOTE — Progress Notes (Signed)
   Abigail Beasley [birth year] [id number]  Patient outreached by Mack Guise , PharmD Candidate on 10/31/2022.  Blood Pressure Readings: Last documented ambulatory systolic blood pressure: 146 Last documented ambulatory diastolic blood pressure: 76 Does the patient have a validated home blood pressure machine?: Yes They report home readings N/A  Medication review was performed. Meds being taking as directed from their prescribed list include: Amlodipine 5 mg, HCTZ 25 mg  The following barriers to adherence were noted: Does the patient have cost concerns?: No Does the patient have transportation concerns?: No Does the patient need assistance obtaining refills?: No Does the patient occassionally forget to take some of their prescribed medications?: No Does the patient feel like one/some of their medications make them feel poorly?: No Does the patient have questions or concerns about their medications?: No Does the patient have a follow up scheduled with their primary care provider/cardiologist?: Yes   Interventions: Interventions Completed: Medications were reviewed, Patient was educated on medications, including indication and administration Discussed uncontrolled neuropathic pain. Gabapentin not providing relief will discuss with PCP at visit on 6/4 The patient has follow up scheduled:  PCP: Massie Maroon, FNP   Mack Guise, Student-PharmD

## 2022-11-01 ENCOUNTER — Ambulatory Visit (INDEPENDENT_AMBULATORY_CARE_PROVIDER_SITE_OTHER): Payer: Self-pay | Admitting: Family Medicine

## 2022-11-01 ENCOUNTER — Encounter: Payer: Self-pay | Admitting: Family Medicine

## 2022-11-01 VITALS — BP 138/82 | HR 64 | Temp 97.2°F | Wt 280.6 lb

## 2022-11-01 DIAGNOSIS — I10 Essential (primary) hypertension: Secondary | ICD-10-CM

## 2022-11-01 DIAGNOSIS — Z1211 Encounter for screening for malignant neoplasm of colon: Secondary | ICD-10-CM

## 2022-11-01 DIAGNOSIS — N182 Chronic kidney disease, stage 2 (mild): Secondary | ICD-10-CM

## 2022-11-01 DIAGNOSIS — R7303 Prediabetes: Secondary | ICD-10-CM

## 2022-11-01 DIAGNOSIS — R110 Nausea: Secondary | ICD-10-CM

## 2022-11-01 DIAGNOSIS — G629 Polyneuropathy, unspecified: Secondary | ICD-10-CM

## 2022-11-01 LAB — POCT GLYCOSYLATED HEMOGLOBIN (HGB A1C): Hemoglobin A1C: 5.7 % — AB (ref 4.0–5.6)

## 2022-11-01 LAB — POCT URINALYSIS DIPSTICK
Blood, UA: NEGATIVE
Glucose, UA: NEGATIVE
Ketones, UA: NEGATIVE
Leukocytes, UA: NEGATIVE
Nitrite, UA: NEGATIVE
Protein, UA: POSITIVE — AB
Spec Grav, UA: >=1.03 — AB (ref 1.010–1.025)
Urobilinogen, UA: 1 E.U./dL
pH, UA: 5.5 (ref 5.0–8.0)

## 2022-11-01 MED ORDER — ONDANSETRON HCL 4 MG PO TABS: 4.0000 mg | ORAL_TABLET | Freq: Three times a day (TID) | ORAL | 0 refills | Status: DC | PRN

## 2022-11-01 MED ORDER — GABAPENTIN 600 MG PO TABS: 600.0000 mg | ORAL_TABLET | Freq: Two times a day (BID) | ORAL | 5 refills | Status: DC

## 2022-11-01 NOTE — Progress Notes (Signed)
Established Patient Office Visit  Subjective   Patient ID: Abigail Beasley, female    DOB: 1970/11/30  Age: 52 y.o. MRN: [id number]  Chief Complaint  Patient presents with   Hypertension    Follow up    Abigail Beasley is a 52 year old female with a medical history significant for hypertension, chronic kidney disease stage II, prediabetes and obesity presents for a follow up of chronic conditions.   Hypertension This is a chronic problem. The problem is controlled. Pertinent negatives include no anxiety, blurred vision, chest pain, headaches, malaise/fatigue, neck pain, orthopnea, palpitations, peripheral edema, PND, shortness of breath or sweats. There are no known risk factors for coronary artery disease. Hypertensive end-organ damage includes kidney disease.    Patient Active Problem List   Diagnosis Date Noted   Trichomonas vaginitis 08/15/2020   Status post laparoscopic hysterectomy 11/08/2016   Severe obesity (BMI >= 40) (HCC) 07/28/2014   Dizziness and giddiness 03/18/2013   Essential hypertension, benign 03/18/2013   Breast pain, left 03/18/2013   Past Medical History:  Diagnosis Date   Allergy    Elevated serum creatinine 01/2019   History of proteinuria syndrome 01/2019   Hypertension    Left flank pain 01/2019   PONV (postoperative nausea and vomiting)    Past Surgical History:  Procedure Laterality Date   ABDOMINAL HYSTERECTOMY     CYSTOSCOPY N/A 11/08/2016   Procedure: CYSTOSCOPY;  Surgeon: Romualdo Bolk, MD;  Location: [facility];  Service: Gynecology;  Laterality: N/A;   DILATION AND EVACUATION     miscarriage   TOTAL LAPAROSCOPIC HYSTERECTOMY WITH SALPINGECTOMY Bilateral 11/08/2016   Procedure: HYSTERECTOMY TOTAL LAPAROSCOPIC WITH BILATERAL SALPINGECTOMY;  Surgeon: Romualdo Bolk, MD;  Location: [facility];  Service: Gynecology;  Laterality: Bilateral;   TUBAL LIGATION     Social History   Tobacco Use   Smoking status: Never   Smokeless tobacco: Never   Vaping Use   Vaping Use: Never used  Substance Use Topics   Alcohol use: Yes    Alcohol/week: 3.0 standard drinks of alcohol    Types: 3 Standard drinks or equivalent per week    Comment: occ   Drug use: Not Currently    Types: Marijuana    Comment: occ   Social History   Socioeconomic History   Marital status: Single    Spouse name: Not on file   Number of children: Not on file   Years of education: Not on file   Highest education level: 9th grade  Occupational History   Not on file  Tobacco Use   Smoking status: Never   Smokeless tobacco: Never  Vaping Use   Vaping Use: Never used  Substance and Sexual Activity   Alcohol use: Yes    Alcohol/week: 3.0 standard drinks of alcohol    Types: 3 Standard drinks or equivalent per week    Comment: occ   Drug use: Not Currently    Types: Marijuana    Comment: occ   Sexual activity: Not Currently    Partners: Male    Birth control/protection: Surgical  Other Topics Concern   Not on file  Social History Narrative   Marital status: single; not dating.      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      Tobacco: none      Alcohol:  Socially; rarely.  Drugs: none; marijuana in the past.      Exercise: none      History of incarceration for six years (guilt by association; friend robbed bank).      Sexual activity:  Males; last STD Gonorrhea at age 43.  Syphilis in twenties.    Social Determinants of Health   Financial Resource Strain: Low Risk  (09/23/2022)   Overall Financial Resource Strain (CARDIA)    Difficulty of Paying Living Expenses: Not hard at all  Food Insecurity: Food Insecurity Present (09/23/2022)   Hunger Vital Sign    Worried About Running Out of Food in the Last Year: Sometimes true    Ran Out of Food in the Last Year: Sometimes true  Transportation Needs: No Transportation Needs (09/23/2022)   PRAPARE -  Administrator, Civil Service (Medical): No    Lack of Transportation (Non-Medical): No  Physical Activity: Inactive (09/23/2022)   Exercise Vital Sign    Days of Exercise per Week: 1 day    Minutes of Exercise per Session: 0 min  Stress: No Stress Concern Present (09/23/2022)   Abigail Beasley of Occupational Health - Occupational Stress Questionnaire    Feeling of Stress : Only a little  Social Connections: Moderately Isolated (09/23/2022)   Social Connection and Isolation Panel [NHANES]    Frequency of Communication with Friends and Family: More than three times a week    Frequency of Social Gatherings with Friends and Family: Once a week    Attends Religious Services: Never    Database administrator or Organizations: No    Attends Engineer, structural: Not on file    Marital Status: Living with partner  Intimate Partner Violence: Not on file   Family Status  Relation Name Status   Mother  Deceased at age 58       cancer liver   Father  Alive       58   Sister  Alive   Sister  Manufacturing engineer  (Not Specified)   MGM  Alive   MGF  Deceased   PGM  Deceased   Neg Hx  (Not Specified)   Family History  Problem Relation Age of Onset   Diabetes Mother    Hypertension Mother    Cancer Mother        liver    Hyperlipidemia Mother    Stroke Mother    Hypertension Sister    Diabetes Sister    Diabetes Maternal Uncle    Diabetes Maternal Grandmother    Hypertension Maternal Grandmother    Hyperlipidemia Maternal Grandmother    Heart disease Maternal Grandmother    Cancer Maternal Grandfather        prostate   Diabetes Maternal Grandfather    Hyperlipidemia Maternal Grandfather    Hypertension Maternal Grandfather    Colon cancer Maternal Grandfather    Breast cancer Neg Hx    Allergies  Allergen Reactions   Lisinopril Cough      Review of Systems  Constitutional: Negative.  Negative for malaise/fatigue.  HENT: Negative.    Eyes: Negative.   Negative for blurred vision.  Respiratory:  Negative for shortness of breath.   Cardiovascular: Negative.  Negative for chest pain, palpitations, orthopnea and PND.  Gastrointestinal:  Positive for nausea.  Genitourinary: Negative.   Musculoskeletal: Negative.  Negative for neck pain.  Skin: Negative.   Neurological: Negative.  Negative for focal weakness and headaches.  Endo/Heme/Allergies: Negative.   Psychiatric/Behavioral:  Negative.        Objective:     BP 138/82   Pulse 64   Temp (!) 97.2 F (36.2 C)   Wt 280 lb 9.6 oz (127.3 kg)   LMP 10/17/2016 (Exact Date)   SpO2 98%   BMI 43.95 kg/m  BP Readings from Last 3 Encounters:  11/01/22 138/82  06/24/22 (!) 146/76  06/21/22 139/80   Wt Readings from Last 3 Encounters:  11/01/22 280 lb 9.6 oz (127.3 kg)  06/24/22 287 lb 3.2 oz (130.3 kg)  06/21/22 282 lb 9.6 oz (128.2 kg)      Physical Exam Constitutional:      Appearance: Normal appearance. She is obese.  Eyes:     Pupils: Pupils are equal, round, and reactive to light.  Cardiovascular:     Rate and Rhythm: Normal rate and regular rhythm.     Pulses: Normal pulses.  Pulmonary:     Effort: Pulmonary effort is normal.  Abdominal:     General: Bowel sounds are normal.  Musculoskeletal:        General: Normal range of motion.  Skin:    General: Skin is warm.  Neurological:     General: No focal deficit present.     Mental Status: She is alert. Mental status is at baseline.  Psychiatric:        Mood and Affect: Mood normal.        Behavior: Behavior normal.        Thought Content: Thought content normal.        Judgment: Judgment normal.      Results for orders placed or performed in visit on 11/01/22  Urinalysis Dipstick  Result Value Ref Range   Color, UA straw    Clarity, UA clear    Glucose, UA Negative Negative   Bilirubin, UA small    Ketones, UA n    Spec Grav, UA >=1.030 (A) 1.010 - 1.025   Blood, UA n    pH, UA 5.5 5.0 - 8.0   Protein,  UA Positive (A) Negative   Urobilinogen, UA 1.0 0.2 or 1.0 E.U./dL   Nitrite, UA n    Leukocytes, UA Negative Negative   Appearance     Odor    HgB A1c  Result Value Ref Range   Hemoglobin A1C 5.7 (A) 4.0 - 5.6 %   HbA1c POC (<> result, manual entry)     HbA1c, POC (prediabetic range)     HbA1c, POC (controlled diabetic range)      Last CBC Lab Results  Component Value Date   WBC 5.5 01/26/2021   HGB 14.5 01/26/2021   HCT 42.1 01/26/2021   MCV 88 01/26/2021   MCH 30.3 01/26/2021   RDW 12.1 01/26/2021   PLT 271 01/26/2021   Last metabolic panel Lab Results  Component Value Date   GLUCOSE 98 06/24/2022   NA 144 06/24/2022   K 4.4 06/24/2022   CL 104 06/24/2022   CO2 17 (L) 06/24/2022   BUN 18 06/24/2022   CREATININE 1.39 (H) 06/24/2022   EGFR 45 (L) 06/24/2022   CALCIUM 9.6 06/24/2022   PHOS 2.9 11/15/2016   PROT 6.8 02/08/2022   ALBUMIN 3.9 02/08/2022   LABGLOB 2.9 02/08/2022   AGRATIO 1.3 02/08/2022   BILITOT 0.4 02/08/2022   ALKPHOS 82 02/08/2022   AST 17 02/08/2022   ALT 17 02/08/2022   ANIONGAP 9 05/06/2020   Last lipids Lab Results  Component Value Date   CHOL  170 08/11/2020   HDL 61 08/11/2020   LDLCALC 94 08/11/2020   TRIG 83 08/11/2020   CHOLHDL 2.8 08/11/2020   Last hemoglobin A1c Lab Results  Component Value Date   HGBA1C 5.7 (A) 11/01/2022   Last thyroid functions Lab Results  Component Value Date   TSH 1.910 01/26/2021   Last vitamin D Lab Results  Component Value Date   VD25OH CANCELED 06/24/2022   Last vitamin B12 and Folate Lab Results  Component Value Date   VITAMINB12 579 01/26/2021      The 10-year ASCVD risk score (Arnett DK, et al., 2019) is: 4.3%    Assessment & Plan:   Problem List Items Addressed This Visit       Cardiovascular and Mediastinum   Essential hypertension, benign - Primary   Relevant Orders   Urinalysis Dipstick (Completed)   Other Visit Diagnoses     Stage 2 chronic kidney disease        Neuropathy       Relevant Medications   gabapentin (NEURONTIN) 600 MG tablet   Nausea       Relevant Medications   ondansetron (ZOFRAN) 4 MG tablet   Prediabetes       Relevant Orders   HgB A1c (Completed)   Colon cancer screening       Relevant Orders   Ambulatory referral to Gastroenterology      1. Essential hypertension, benign BP 138/82   Pulse 64   Temp (!) 97.2 F (36.2 C)   Wt 280 lb 9.6 oz (127.3 kg)   LMP 10/17/2016 (Exact Date)   SpO2 98%   BMI 43.95 kg/m  - Continue medication, monitor blood pressure at home. Continue DASH diet.  Reminder to go to the ER if any CP, SOB, nausea, dizziness, severe HA, changes vision/speech, left arm numbness and tingling and jaw pain.   - Urinalysis Dipstick  2. Stage 2 chronic kidney disease Reminded patient to avoid all nephrotoxins  3. Neuropathy Increased dose for greater pain control.  - gabapentin (NEURONTIN) 600 MG tablet; Take 1 tablet (600 mg total) by mouth 2 (two) times daily.  Dispense: 60 tablet; Refill: 5  4. Nausea  - ondansetron (ZOFRAN) 4 MG tablet; Take 1 tablet (4 mg total) by mouth every 8 (eight) hours as needed for nausea or vomiting.  Dispense: 20 tablet; Refill: 0  5. Prediabetes  - HgB A1c  6. Colon cancer screening - Ambulatory referral to Gastroenterology    Return in about 3 months (around 02/01/2023) for hypertension.    Nolon Nations  APRN, MSN, FNP-C Patient Care [facility] [address] [place], [place] [address] [phone]

## 2022-11-07 ENCOUNTER — Encounter: Payer: Self-pay | Admitting: Internal Medicine

## 2022-11-18 ENCOUNTER — Ambulatory Visit (AMBULATORY_SURGERY_CENTER): Payer: 59

## 2022-11-18 ENCOUNTER — Encounter: Payer: Self-pay | Admitting: Internal Medicine

## 2022-11-18 VITALS — Ht 67.0 in | Wt 269.0 lb

## 2022-11-18 DIAGNOSIS — Z1211 Encounter for screening for malignant neoplasm of colon: Secondary | ICD-10-CM

## 2022-11-18 MED ORDER — NA SULFATE-K SULFATE-MG SULF 17.5-3.13-1.6 GM/177ML PO SOLN
1.0000 | Freq: Once | ORAL | 0 refills | Status: AC
Start: 2022-11-18 — End: 2022-11-18

## 2022-11-18 NOTE — Progress Notes (Signed)

## 2022-12-15 ENCOUNTER — Encounter: Payer: Self-pay | Admitting: Internal Medicine

## 2022-12-15 ENCOUNTER — Ambulatory Visit: Payer: 59 | Admitting: Internal Medicine

## 2022-12-15 VITALS — BP 190/100 | HR 66 | Temp 97.3°F | Ht 67.0 in | Wt 269.0 lb

## 2022-12-15 DIAGNOSIS — Z1211 Encounter for screening for malignant neoplasm of colon: Secondary | ICD-10-CM

## 2022-12-15 MED ORDER — SODIUM CHLORIDE 0.9 % IV SOLN
500.0000 mL | INTRAVENOUS | Status: DC
Start: 2022-12-15 — End: 2022-12-15

## 2022-12-15 NOTE — Progress Notes (Signed)
IV was not able to be obtained after several attempts. Patient will be rescheduled in the hospital for colonoscopy.

## 2022-12-15 NOTE — Progress Notes (Signed)
Pt's states no medical or surgical changes since previsit or office visit. 

## 2022-12-22 ENCOUNTER — Other Ambulatory Visit: Payer: Self-pay | Admitting: Family Medicine

## 2022-12-22 DIAGNOSIS — Z1231 Encounter for screening mammogram for malignant neoplasm of breast: Secondary | ICD-10-CM

## 2023-01-03 ENCOUNTER — Inpatient Hospital Stay: Admission: RE | Admit: 2023-01-03 | Payer: 59 | Source: Ambulatory Visit

## 2023-01-24 ENCOUNTER — Other Ambulatory Visit: Payer: Self-pay | Admitting: Family Medicine

## 2023-01-26 ENCOUNTER — Other Ambulatory Visit: Payer: Self-pay | Admitting: Family Medicine

## 2023-01-26 DIAGNOSIS — I10 Essential (primary) hypertension: Secondary | ICD-10-CM

## 2023-01-28 ENCOUNTER — Other Ambulatory Visit: Payer: Self-pay | Admitting: Family Medicine

## 2023-01-28 DIAGNOSIS — I10 Essential (primary) hypertension: Secondary | ICD-10-CM

## 2023-01-31 ENCOUNTER — Ambulatory Visit: Payer: 59 | Admitting: Family Medicine

## 2023-02-01 NOTE — Telephone Encounter (Signed)
Caller & Relationship to patient:  MRN #  [id number]   Call Back Number:   Date of Last Office Visit: 01/31/2023     Date of Next Office Visit: 04/04/2023    Medication(s) to be Refilled: Blood pressure med's to be refilled  Preferred Pharmacy:   ** Please notify patient to allow 48-72 hours to process** **Let patient know to contact pharmacy at the end of the day to make sure medication is ready. ** **If patient has not been seen in a year or longer, book an appointment **Advise to use MyChart for refill requests OR to contact their pharmacy

## 2023-02-02 NOTE — Telephone Encounter (Signed)
Done 01/27/23

## 2023-02-03 ENCOUNTER — Other Ambulatory Visit: Payer: Self-pay | Admitting: Pharmacist

## 2023-02-03 ENCOUNTER — Encounter: Payer: Self-pay | Admitting: Pharmacist

## 2023-02-03 NOTE — Progress Notes (Signed)
Patient appearing on report for [employer] - Hypertension Control report due to last documented ambulatory blood pressure of 190/100 on 12/15/22. Next appointment with PCP is 12/15/22.   Outreached patient to discuss hypertension control and medication management. Patient requested call back today at 3 PM, but no answer.  Adam Phenix, PharmD PGY-1 Pharmacy Resident

## 2023-02-08 ENCOUNTER — Telehealth: Payer: Self-pay | Admitting: Pharmacist

## 2023-02-08 ENCOUNTER — Telehealth: Payer: Self-pay

## 2023-02-08 NOTE — Telephone Encounter (Signed)
Called patient to schedule colonoscopy at [facility] no answer left message on voice mail to call office back.

## 2023-02-09 NOTE — Progress Notes (Signed)
Error

## 2023-02-09 NOTE — Telephone Encounter (Signed)
Inbound call from patient returning phone call to schedule colonoscopy. Patient requesting a call back. Please advise, thank you,

## 2023-02-10 ENCOUNTER — Telehealth: Payer: Self-pay

## 2023-02-10 ENCOUNTER — Other Ambulatory Visit: Payer: Self-pay

## 2023-02-10 ENCOUNTER — Other Ambulatory Visit: Payer: Self-pay | Admitting: Family Medicine

## 2023-02-10 DIAGNOSIS — Z1211 Encounter for screening for malignant neoplasm of colon: Secondary | ICD-10-CM

## 2023-02-10 MED ORDER — NA SULFATE-K SULFATE-MG SULF 17.5-3.13-1.6 GM/177ML PO SOLN: ORAL | 0 refills | Status: DC

## 2023-02-10 NOTE — Telephone Encounter (Signed)
Spoke to patient scheduled colonoscopy 02/28/23 at [facility] with Dr Leonides Schanz.

## 2023-02-10 NOTE — Telephone Encounter (Signed)
Please advise Kh

## 2023-02-15 ENCOUNTER — Other Ambulatory Visit: Payer: Self-pay

## 2023-02-15 MED ORDER — NA SULFATE-K SULFATE-MG SULF 17.5-3.13-1.6 GM/177ML PO SOLN: ORAL | 0 refills | Status: DC

## 2023-02-15 NOTE — Telephone Encounter (Signed)
Inbound call from patient, would like prep medication sent to [facility] on [address] due to pricing.

## 2023-02-21 ENCOUNTER — Encounter (HOSPITAL_COMMUNITY): Payer: Self-pay | Admitting: Internal Medicine

## 2023-02-21 NOTE — Progress Notes (Signed)
Attempted to obtain medical history via telephone, unable to reach at this time. HIPAA compliant voicemail message left requesting return call to pre surgical testing department.

## 2023-02-28 ENCOUNTER — Encounter (HOSPITAL_COMMUNITY): Payer: Self-pay | Admitting: Internal Medicine

## 2023-02-28 ENCOUNTER — Ambulatory Visit (HOSPITAL_COMMUNITY)
Admission: RE | Admit: 2023-02-28 | Discharge: 2023-02-28 | Disposition: A | Discharge status: Home / Self Care | Payer: 59 | Attending: Internal Medicine | Admitting: Internal Medicine

## 2023-02-28 ENCOUNTER — Other Ambulatory Visit: Payer: Self-pay

## 2023-02-28 ENCOUNTER — Ambulatory Visit (HOSPITAL_COMMUNITY): Payer: 59 | Admitting: Certified Registered Nurse Anesthetist

## 2023-02-28 ENCOUNTER — Encounter (HOSPITAL_COMMUNITY)
Admission: RE | Disposition: A | Discharge status: Home / Self Care | Payer: Self-pay | Source: Home / Self Care | Attending: Internal Medicine

## 2023-02-28 ENCOUNTER — Ambulatory Visit (HOSPITAL_BASED_OUTPATIENT_CLINIC_OR_DEPARTMENT_OTHER): Payer: 59 | Admitting: Certified Registered Nurse Anesthetist

## 2023-02-28 DIAGNOSIS — I10 Essential (primary) hypertension: Secondary | ICD-10-CM | POA: Diagnosis not present

## 2023-02-28 DIAGNOSIS — K648 Other hemorrhoids: Secondary | ICD-10-CM | POA: Diagnosis not present

## 2023-02-28 DIAGNOSIS — Z1211 Encounter for screening for malignant neoplasm of colon: Secondary | ICD-10-CM

## 2023-02-28 DIAGNOSIS — F419 Anxiety disorder, unspecified: Secondary | ICD-10-CM | POA: Diagnosis not present

## 2023-02-28 DIAGNOSIS — Z7984 Long term (current) use of oral hypoglycemic drugs: Secondary | ICD-10-CM | POA: Diagnosis not present

## 2023-02-28 DIAGNOSIS — K5730 Diverticulosis of large intestine without perforation or abscess without bleeding: Secondary | ICD-10-CM

## 2023-02-28 DIAGNOSIS — E119 Type 2 diabetes mellitus without complications: Secondary | ICD-10-CM | POA: Diagnosis not present

## 2023-02-28 DIAGNOSIS — Z6841 Body Mass Index (BMI) 40.0 and over, adult: Secondary | ICD-10-CM | POA: Insufficient documentation

## 2023-02-28 HISTORY — PX: COLONOSCOPY WITH PROPOFOL: SHX5780

## 2023-02-28 LAB — HM COLONOSCOPY

## 2023-02-28 SURGERY — COLONOSCOPY WITH PROPOFOL
Anesthesia: Monitor Anesthesia Care

## 2023-02-28 MED ORDER — PROPOFOL 500 MG/50ML IV EMUL
INTRAVENOUS | Status: DC | PRN
  Administered 2023-02-28: 140 ug/kg/min via INTRAVENOUS

## 2023-02-28 MED ORDER — PROPOFOL 10 MG/ML IV BOLUS
INTRAVENOUS | Status: DC | PRN
  Administered 2023-02-28: 70 mg via INTRAVENOUS

## 2023-02-28 MED ORDER — SODIUM CHLORIDE 0.9 % IV SOLN: INTRAVENOUS | Status: DC

## 2023-02-28 MED ORDER — LACTATED RINGERS IV SOLN
INTRAVENOUS | Status: AC | PRN
Start: 2023-02-28 — End: 2023-02-28
  Administered 2023-02-28: 1000 mL via INTRAVENOUS

## 2023-02-28 SURGICAL SUPPLY — 22 items
ELECT REM PT RETURN 9FT ADLT (ELECTROSURGICAL)
ELECTRODE REM PT RTRN 9FT ADLT (ELECTROSURGICAL) IMPLANT
FCP BXJMBJMB 240X2.8X (CUTTING FORCEPS)
FLOOR PAD 36X40 (MISCELLANEOUS) ×1
FORCEPS BIOP RAD 4 LRG CAP 4 (CUTTING FORCEPS) IMPLANT
FORCEPS BIOP RJ4 240 W/NDL (CUTTING FORCEPS)
FORCEPS BXJMBJMB 240X2.8X (CUTTING FORCEPS) IMPLANT
INJECTOR/SNARE I SNARE (MISCELLANEOUS) IMPLANT
LUBRICANT JELLY 4.5OZ STERILE (MISCELLANEOUS) IMPLANT
MANIFOLD NEPTUNE II (INSTRUMENTS) IMPLANT
NDL SCLEROTHERAPY 25GX240 (NEEDLE) IMPLANT
NEEDLE SCLEROTHERAPY 25GX240 (NEEDLE)
PAD FLOOR 36X40 (MISCELLANEOUS) ×1 IMPLANT
PROBE APC STR FIRE (PROBE) IMPLANT
PROBE INJECTION GOLD (MISCELLANEOUS)
PROBE INJECTION GOLD 7FR (MISCELLANEOUS) IMPLANT
SNARE ROTATE MED OVAL 20MM (MISCELLANEOUS) IMPLANT
SYR 50ML LL SCALE MARK (SYRINGE) IMPLANT
TRAP SPECIMEN MUCOUS 40CC (MISCELLANEOUS) IMPLANT
TUBING ENDO SMARTCAP PENTAX (MISCELLANEOUS) IMPLANT
TUBING IRRIGATION ENDOGATOR (MISCELLANEOUS) ×1 IMPLANT
WATER STERILE IRR 1000ML POUR (IV SOLUTION) IMPLANT

## 2023-02-28 NOTE — Progress Notes (Signed)
Dr Nance Pew notified of pts high bp post procedure.  He is fine with pt being discharged home as she arrived with high bp, as long as she takes bp meds as soon as she gets home.

## 2023-02-28 NOTE — H&P (Signed)
GASTROENTEROLOGY PROCEDURE H&P NOTE   Primary Care Physician: Massie Maroon, FNP    Reason for Procedure:   Colon cancer screening  Plan:    Colonoscopy  Patient is appropriate for endoscopic procedure(s) in the hospital setting.  The nature of the procedure, as well as the risks, benefits, and alternatives were carefully and thoroughly reviewed with the patient. Ample time for discussion and questions allowed. The patient understood, was satisfied, and agreed to proceed.     HPI: Abigail Beasley is a 52 y.o. female who presents for colonoscopy for colon cancer screening. Her procedure was originally scheduled in the [facility] but then had to be rescheduled for the hospital because IV was unable to be obtained in the [facility]. Grandfather and uncle had colon cancer. Denies blood in the stools, changes in the bowel habits, unintentional weight loss.   Past Medical History:  Diagnosis Date   Allergy    Anxiety    Elevated serum creatinine 01/2019   History of proteinuria syndrome 01/2019   Hypertension    Left flank pain 01/2019   PONV (postoperative nausea and vomiting)     Past Surgical History:  Procedure Laterality Date   ABDOMINAL HYSTERECTOMY     CYSTOSCOPY N/A 11/08/2016   Procedure: CYSTOSCOPY;  Surgeon: Romualdo Bolk, MD;  Location: [facility];  Service: Gynecology;  Laterality: N/A;   DILATION AND EVACUATION     miscarriage   TOTAL LAPAROSCOPIC HYSTERECTOMY WITH SALPINGECTOMY Bilateral 11/08/2016   Procedure: HYSTERECTOMY TOTAL LAPAROSCOPIC WITH BILATERAL SALPINGECTOMY;  Surgeon: Romualdo Bolk, MD;  Location: [facility];  Service: Gynecology;  Laterality: Bilateral;   TUBAL LIGATION      Prior to Admission medications   Medication Sig Start Date End Date Taking? Authorizing Provider  amLODipine (NORVASC) 5 MG tablet Take 1 tablet by mouth once daily 01/27/23  Yes Massie Maroon, FNP  dorzolamide-timolol (COSOPT) 2-0.5 % ophthalmic solution 1 drop 2 (two) times  daily. 11/04/22  Yes [provider]  gabapentin (NEURONTIN) 600 MG tablet Take 1 tablet (600 mg total) by mouth 2 (two) times daily. 11/01/22  Yes Massie Maroon, FNP  hydrochlorothiazide (HYDRODIURIL) 25 MG tablet Take 1 tablet by mouth once daily 01/27/23  Yes Massie Maroon, FNP  meloxicam [facility]) 15 MG tablet Take 1 tablet by mouth once daily 02/10/23  Yes Massie Maroon, FNP  Multiple Vitamin (MULTIVITAMIN WITH MINERALS) TABS tablet Take 1 tablet by mouth 4 (four) times a week.   Yes [provider]  Na Sulfate-K Sulfate-Mg Sulf 17.5-3.13-1.6 GM/177ML SOLN Use as directed; may use generic; goodrx card if insurance will not cover generic 02/15/23  Yes Imogene Burn, MD  Ferrous Sulfate (IRON PO) Take 1 tablet by mouth daily. Patient not taking: Reported on 12/15/2022    [provider]  gabapentin (NEURONTIN) 300 MG capsule TAKE 1 CAPSULE BY MOUTH EVERY 8 HOURS Patient not taking: Reported on 12/15/2022 07/28/22   Massie Maroon, FNP  HYDROcodone-acetaminophen (NORCO/VICODIN) 5-325 MG tablet Take 1 tablet by mouth every 8 (eight) hours as needed. Patient not taking: Reported on 06/24/2022 05/07/21   Couture, Cortni S, PA-C  latanoprost (XALATAN) 0.005 % ophthalmic solution 1 drop at bedtime. 11/04/22   [provider]  lidocaine (LIDODERM) 5 % USE 1 PATCH EXTERNALLY ONCE DAILY (REMOVE  &  DISCARD  PATCH  WITHIN  12  HOURS  OR  AS  DIRECTED  BY  MD) Patient not taking: Reported on 11/01/2022 09/19/22  Massie Maroon, FNP  loratadine (CLARITIN) 10 MG tablet Take 1 tablet (10 mg total) by mouth daily. Patient not taking: Reported on 06/24/2022 05/04/21   Massie Maroon, FNP  metFORMIN (GLUCOPHAGE) 500 MG tablet Take 1 tablet (500 mg total) by mouth daily with breakfast. Patient not taking: Reported on 06/21/2022 02/11/22   Massie Maroon, FNP  ondansetron (ZOFRAN) 4 MG tablet Take 1 tablet (4 mg total) by mouth every 8 (eight) hours as needed for nausea or  vomiting. Patient not taking: Reported on 11/18/2022 11/01/22   Massie Maroon, FNP  traZODone (DESYREL) 50 MG tablet Take 0.5 tablets (25 mg total) by mouth at bedtime as needed for sleep. Patient not taking: Reported on 11/18/2022 02/08/22   Massie Maroon, FNP  omeprazole (PRILOSEC) 40 MG capsule Take 1 capsule (40 mg total) by mouth daily. Patient not taking: Reported on 12/17/2019 09/17/19 05/06/20  Massie Maroon, FNP    Current Facility-Administered Medications  Medication Dose Route Frequency Provider Last Rate Last Admin   0.9 %  sodium chloride infusion   Intravenous Continuous Imogene Burn, MD        Allergies as of 02/10/2023 - Review Complete 12/15/2022  Allergen Reaction Noted   Lisinopril Cough 01/17/2019    Family History  Problem Relation Age of Onset   Diabetes Mother    Hypertension Mother    Cancer Mother        liver    Hyperlipidemia Mother    Stroke Mother    Hypertension Sister    Diabetes Sister    Diabetes Maternal Uncle    Colon polyps Paternal Uncle    Colon cancer Paternal Uncle    Diabetes Maternal Grandmother    Hypertension Maternal Grandmother    Hyperlipidemia Maternal Grandmother    Heart disease Maternal Grandmother    Colon polyps Maternal Grandfather    Cancer Maternal Grandfather        prostate   Diabetes Maternal Grandfather    Hyperlipidemia Maternal Grandfather    Hypertension Maternal Grandfather    Colon cancer Maternal Grandfather    Breast cancer Neg Hx    Esophageal cancer Neg Hx    Rectal cancer Neg Hx    Stomach cancer Neg Hx     Social History   Socioeconomic History   Marital status: Single    Spouse name: Not on file   Number of children: Not on file   Years of education: Not on file   Highest education level: 9th grade  Occupational History   Not on file  Tobacco Use   Smoking status: Never   Smokeless tobacco: Never  Vaping Use   Vaping status: Never Used  Substance and Sexual Activity   Alcohol  use: Yes    Alcohol/week: 3.0 standard drinks of alcohol    Types: 3 Standard drinks or equivalent per week    Comment: occ   Drug use: Not Currently    Types: Marijuana    Comment: occ   Sexual activity: Not Currently    Partners: Male    Birth control/protection: Surgical  Other Topics Concern   Not on file  Social History Narrative   Marital status: single; not dating.      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      Tobacco: none      Alcohol:  Socially; rarely.      Drugs: none; marijuana in the past.      Exercise: none      History of incarceration for six years (guilt by association; friend robbed bank).      Sexual activity:  Males; last STD Gonorrhea at age 26.  Syphilis in twenties.    Social Determinants of Health   Financial Resource Strain: Low Risk  (09/23/2022)   Overall Financial Resource Strain (CARDIA)    Difficulty of Paying Living Expenses: Not hard at all  Food Insecurity: Food Insecurity Present (09/23/2022)   Hunger Vital Sign    Worried About Running Out of Food in the Last Year: Sometimes true    Ran Out of Food in the Last Year: Sometimes true  Transportation Needs: No Transportation Needs (09/23/2022)   PRAPARE - Administrator, Civil Service (Medical): No    Lack of Transportation (Non-Medical): No  Physical Activity: Inactive (09/23/2022)   Exercise Vital Sign    Days of Exercise per Week: 1 day    Minutes of Exercise per Session: 0 min  Stress: No Stress Concern Present (09/23/2022)   [employer] of Occupational Health - Occupational Stress Questionnaire    Feeling of Stress : Only a little  Social Connections: Moderately Isolated (09/23/2022)   Social Connection and Isolation Panel [NHANES]    Frequency of Communication with Friends and Family: More than three times a week    Frequency of Social Gatherings with Friends and Family: Once a  week    Attends Religious Services: Never    Database administrator or Organizations: No    Attends Engineer, structural: Not on file    Marital Status: Living with partner  Intimate Partner Violence: Not on file    Physical Exam: Vital signs in last 24 hours: Pulse (P) 65   Temp (!) (P) 97.5 F (36.4 C) (Tympanic)   Resp (P) 14   LMP 10/17/2016 (Exact Date)   SpO2 (P) 96%  GEN: NAD EYE: Sclerae anicteric ENT: MMM CV: Non-tachycardic Pulm: No increased work of breathing GI: Soft, NT/ND NEURO:  Alert & Oriented   Eulah Pont, MD [facility]  02/28/2023 9:38 AM

## 2023-02-28 NOTE — Op Note (Signed)
[facility] Patient Name: Abigail Beasley Procedure Date: 02/28/2023 MRN: [id number] Attending MD: Particia Lather , , [id number] Date of Birth: [birth year] CSN: [id number] Age: 52 Admit Type: Outpatient Procedure:                Colonoscopy Indications:              Screening for colorectal malignant neoplasm Providers:                Madelyn Brunner" Marolyn Hammock, RN, Weston Settle, RN, Rozetta Nunnery, Technician Referring MD:             Loura Back Medicines:                Monitored Anesthesia Care Complications:            No immediate complications. Estimated Blood Loss:     Estimated blood loss: none. Procedure:                Pre-Anesthesia Assessment:                           - Prior to the procedure, a History and Physical                            was performed, and patient medications and                            allergies were reviewed. The patient's tolerance of                            previous anesthesia was also reviewed. The risks                            and benefits of the procedure and the sedation                            options and risks were discussed with the patient.                            All questions were answered, and informed consent                            was obtained. Prior Anticoagulants: The patient has                            taken no anticoagulant or antiplatelet agents. ASA                            Grade Assessment: II - A patient with mild systemic                            disease. After reviewing the risks and benefits,  the patient was deemed in satisfactory condition to                            undergo the procedure.                           After obtaining informed consent, the colonoscope                            was passed under direct vision. Throughout the                            procedure, the patient's blood pressure, pulse, and                             oxygen saturations were monitored continuously. The                            CF-HQ190L ([id number]) Olympus colonoscope was                            introduced through the anus and advanced to the the                            terminal ileum. The colonoscopy was performed                            without difficulty. The patient tolerated the                            procedure well. The quality of the bowel                            preparation was good. The terminal ileum, ileocecal                            valve, appendiceal orifice, and rectum were                            photographed. Scope In: 10:03:16 AM Scope Out: 10:25:46 AM Scope Withdrawal Time: 0 hours 14 minutes 11 seconds  Total Procedure Duration: 0 hours 22 minutes 30 seconds  Findings:      The terminal ileum appeared normal.      Multiple diverticula were found in the sigmoid colon.      Non-bleeding internal hemorrhoids were found during retroflexion. Impression:               - The examined portion of the ileum was normal.                           - Diverticulosis in the sigmoid colon.                           - Non-bleeding internal hemorrhoids.                           -  No specimens collected. Moderate Sedation:      Not Applicable - Patient had care per Anesthesia. Recommendation:           - Discharge patient to home (with escort).                           - Repeat colonoscopy in 10 years for screening                            purposes.                           - The findings and recommendations were discussed                            with the patient. Procedure Code(s):        --- Professional ---                           O9629, Colorectal cancer screening; colonoscopy on                            individual not meeting criteria for high risk Diagnosis Code(s):        --- Professional ---                           Z12.11, Encounter for screening for malignant                             neoplasm of colon                           K64.8, Other hemorrhoids                           K57.30, Diverticulosis of large intestine without                            perforation or abscess without bleeding CPT copyright 2022 [employer]. All rights reserved. The codes documented in this report are preliminary and upon coder review may  be revised to meet current compliance requirements. Dr Particia Lather "[name]" Leonides Schanz,  02/28/2023 10:32:23 AM Number of Addenda: 0

## 2023-02-28 NOTE — Transfer of Care (Signed)
Immediate Anesthesia Transfer of Care Note  Patient: Abigail Beasley  Procedure(s) Performed: COLONOSCOPY WITH PROPOFOL  Patient Location: PACU and Endoscopy Unit  Anesthesia Type:MAC  Level of Consciousness: awake, alert , and oriented  Airway & Oxygen Therapy: Patient Spontanous Breathing  Post-op Assessment: Report given to RN and Post -op Vital signs reviewed and stable  Post vital signs: Reviewed and stable  Last Vitals:  Vitals Value Taken Time  BP    Temp    Pulse 75 02/28/23 1031  Resp 16 02/28/23 1031  SpO2 96 % 02/28/23 1031    Last Pain:  Vitals:   02/28/23 0939  TempSrc: Tympanic  PainSc: 0-No pain         Complications: No notable events documented.

## 2023-02-28 NOTE — Anesthesia Preprocedure Evaluation (Signed)
Anesthesia Evaluation  Patient identified by MRN, date of birth, ID band Patient awake    Reviewed: Allergy & Precautions, NPO status , Patient's Chart, lab work & pertinent test results  History of Anesthesia Complications (+) PONV and history of anesthetic complications  Airway Mallampati: III       Dental no notable dental hx.    Pulmonary neg pulmonary ROS   Pulmonary exam normal        Cardiovascular hypertension, Pt. on medications  Rhythm:Regular Rate:Normal     Neuro/Psych   Anxiety     negative neurological ROS     GI/Hepatic Neg liver ROS,,,  Endo/Other  diabetes, Type 2, Oral Hypoglycemic Agents  Morbid obesity  Renal/GU negative Renal ROS  negative genitourinary   Musculoskeletal negative musculoskeletal ROS (+)    Abdominal  (+) + obese  Peds  Hematology negative hematology ROS (+)   Anesthesia Other Findings   Reproductive/Obstetrics                             Anesthesia Physical Anesthesia Plan  ASA: 3  Anesthesia Plan: MAC   Post-op Pain Management:    Induction: Intravenous  PONV Risk Score and Plan: 3 and Propofol infusion and Treatment may vary due to age or medical condition  Airway Management Planned: Simple Face Mask and Nasal Cannula  Additional Equipment: None  Intra-op Plan:   Post-operative Plan:   Informed Consent: I have reviewed the patients History and Physical, chart, labs and discussed the procedure including the risks, benefits and alternatives for the proposed anesthesia with the patient or authorized representative who has indicated his/her understanding and acceptance.     Dental advisory given  Plan Discussed with: CRNA  Anesthesia Plan Comments:        Anesthesia Quick Evaluation

## 2023-02-28 NOTE — Anesthesia Postprocedure Evaluation (Signed)
Anesthesia Post Note  Patient: Kennieth Rad  Procedure(s) Performed: COLONOSCOPY WITH PROPOFOL     Patient location during evaluation: PACU Anesthesia Type: MAC Level of consciousness: awake and alert Pain management: pain level controlled Vital Signs Assessment: post-procedure vital signs reviewed and stable Respiratory status: spontaneous breathing, nonlabored ventilation, respiratory function stable and patient connected to nasal cannula oxygen Cardiovascular status: stable and blood pressure returned to baseline Postop Assessment: no apparent nausea or vomiting Anesthetic complications: no   No notable events documented.  Last Vitals:  Vitals:   02/28/23 1040 02/28/23 1050  BP: (!) 157/90 (!) 170/98  Pulse: 69 (!) 58  Resp: 15 11  Temp:    SpO2: 95% 100%    Last Pain:  Vitals:   02/28/23 1050  TempSrc:   PainSc: 0-No pain                 Earl Lites P Ambrielle Kington

## 2023-02-28 NOTE — Discharge Instructions (Signed)

## 2023-03-01 ENCOUNTER — Encounter (HOSPITAL_COMMUNITY): Payer: Self-pay | Admitting: Internal Medicine

## 2023-03-14 ENCOUNTER — Emergency Department (HOSPITAL_BASED_OUTPATIENT_CLINIC_OR_DEPARTMENT_OTHER): Payer: 59

## 2023-03-14 ENCOUNTER — Other Ambulatory Visit: Payer: Self-pay

## 2023-03-14 ENCOUNTER — Emergency Department (HOSPITAL_BASED_OUTPATIENT_CLINIC_OR_DEPARTMENT_OTHER)
Admission: EM | Admit: 2023-03-14 | Discharge: 2023-03-14 | Disposition: A | Discharge status: Home / Self Care | Payer: 59 | Attending: Emergency Medicine | Admitting: Emergency Medicine

## 2023-03-14 ENCOUNTER — Encounter (HOSPITAL_BASED_OUTPATIENT_CLINIC_OR_DEPARTMENT_OTHER): Payer: Self-pay | Admitting: Emergency Medicine

## 2023-03-14 DIAGNOSIS — R519 Headache, unspecified: Secondary | ICD-10-CM | POA: Insufficient documentation

## 2023-03-14 DIAGNOSIS — Z79899 Other long term (current) drug therapy: Secondary | ICD-10-CM | POA: Insufficient documentation

## 2023-03-14 DIAGNOSIS — R42 Dizziness and giddiness: Secondary | ICD-10-CM | POA: Insufficient documentation

## 2023-03-14 DIAGNOSIS — I10 Essential (primary) hypertension: Secondary | ICD-10-CM | POA: Insufficient documentation

## 2023-03-14 DIAGNOSIS — Z1152 Encounter for screening for COVID-19: Secondary | ICD-10-CM | POA: Insufficient documentation

## 2023-03-14 LAB — CBC WITH DIFFERENTIAL/PLATELET
Abs Immature Granulocytes: 0.03 10*3/uL (ref 0.00–0.07)
Basophils Absolute: 0.1 10*3/uL (ref 0.0–0.1)
Basophils Relative: 1 %
Eosinophils Absolute: 0.3 10*3/uL (ref 0.0–0.5)
Eosinophils Relative: 5 %
HCT: 42.4 % (ref 36.0–46.0)
Hemoglobin: 14.2 g/dL (ref 12.0–15.0)
Immature Granulocytes: 1 %
Lymphocytes Relative: 22 %
Lymphs Abs: 1.4 10*3/uL (ref 0.7–4.0)
MCH: 30.3 pg (ref 26.0–34.0)
MCHC: 33.5 g/dL (ref 30.0–36.0)
MCV: 90.4 fL (ref 80.0–100.0)
Monocytes Absolute: 0.6 10*3/uL (ref 0.1–1.0)
Monocytes Relative: 9 %
Neutro Abs: 4.2 10*3/uL (ref 1.7–7.7)
Neutrophils Relative %: 62 %
Platelets: 279 10*3/uL (ref 150–400)
RBC: 4.69 MIL/uL (ref 3.87–5.11)
RDW: 12.6 % (ref 11.5–15.5)
WBC: 6.6 10*3/uL (ref 4.0–10.5)
nRBC: 0 % (ref 0.0–0.2)

## 2023-03-14 LAB — CBG MONITORING, ED: Glucose-Capillary: 91 mg/dL (ref 70–99)

## 2023-03-14 LAB — BASIC METABOLIC PANEL
Anion gap: 9 (ref 5–15)
BUN: 21 mg/dL — ABNORMAL HIGH (ref 6–20)
CO2: 26 mmol/L (ref 22–32)
Calcium: 9.5 mg/dL (ref 8.9–10.3)
Chloride: 101 mmol/L (ref 98–111)
Creatinine, Ser: 1.13 mg/dL — ABNORMAL HIGH (ref 0.44–1.00)
GFR, Estimated: 58 mL/min — ABNORMAL LOW (ref >60)
Glucose, Bld: 94 mg/dL (ref 70–99)
Potassium: 3.9 mmol/L (ref 3.5–5.1)
Sodium: 136 mmol/L (ref 135–145)

## 2023-03-14 LAB — RESP PANEL BY RT-PCR (RSV, FLU A&B, COVID)  RVPGX2
Influenza A by PCR: NEGATIVE
Influenza B by PCR: NEGATIVE
Resp Syncytial Virus by PCR: NEGATIVE
SARS Coronavirus 2 by RT PCR: NEGATIVE

## 2023-03-14 LAB — URINALYSIS, ROUTINE W REFLEX MICROSCOPIC
Bilirubin Urine: NEGATIVE
Glucose, UA: NEGATIVE mg/dL
Hgb urine dipstick: NEGATIVE
Ketones, ur: NEGATIVE mg/dL
Leukocytes,Ua: NEGATIVE
Nitrite: NEGATIVE
Specific Gravity, Urine: 1.022 (ref 1.005–1.030)
pH: 6 (ref 5.0–8.0)

## 2023-03-14 MED ORDER — IOHEXOL 350 MG/ML SOLN
75.0000 mL | Freq: Once | INTRAVENOUS | Status: AC | PRN
  Administered 2023-03-14: 75 mL via INTRAVENOUS

## 2023-03-14 NOTE — Discharge Instructions (Signed)
You were seen in the emergency department for dizziness and headache.  You had blood work COVID testing and a CAT scan of your head and neck that did not show an obvious explanation for your symptoms.  Please rest and drink plenty of fluids.  Follow-up with your regular doctor.  Return to the emergency department if any worsening or concerning symptoms

## 2023-03-14 NOTE — ED Notes (Signed)
Pt aware of need for urine sample. Unable to provide sample at this time.

## 2023-03-14 NOTE — ED Provider Notes (Signed)
[place] EMERGENCY DEPARTMENT AT [facility] Provider Note   CSN: [id number] Arrival date & time: 03/14/23  1044     History  No chief complaint on file.   Abigail Beasley is a 52 y.o. female.  She has a history of hypertension.  She is here today after feeling dizzy and off balance along with head pain right temporal into her right eye area.  Rates the headache as 8 out of 10.  Symptoms started yesterday.  She said she had similar episodes twice over the past 3 weeks that lasted a few hours.  No fever nausea vomiting no blurry vision double vision cough shortness of breath urinary symptoms.  No numbness or weakness.  The history is provided by the patient.  Dizziness Quality:  Imbalance and room spinning Severity:  Moderate Onset quality:  Gradual Duration:  3 weeks Timing:  Sporadic Progression:  Worsening Chronicity:  New Relieved by:  Nothing Worsened by:  Nothing Associated symptoms: headaches   Associated symptoms: no chest pain, no diarrhea, no hearing loss, no nausea, no palpitations, no shortness of breath, no syncope, no tinnitus, no vision changes, no vomiting and no weakness        Home Medications Prior to Admission medications   Medication Sig Start Date End Date Taking? Authorizing Provider  amLODipine (NORVASC) 5 MG tablet Take 1 tablet by mouth once daily 01/27/23   Massie Maroon, FNP  dorzolamide-timolol (COSOPT) 2-0.5 % ophthalmic solution 1 drop 2 (two) times daily. 11/04/22   [provider]  Ferrous Sulfate (IRON PO) Take 1 tablet by mouth daily. Patient not taking: Reported on 12/15/2022    [provider]  gabapentin (NEURONTIN) 300 MG capsule TAKE 1 CAPSULE BY MOUTH EVERY 8 HOURS Patient not taking: Reported on 12/15/2022 07/28/22   Massie Maroon, FNP  gabapentin (NEURONTIN) 600 MG tablet Take 1 tablet (600 mg total) by mouth 2 (two) times daily. 11/01/22   Massie Maroon, FNP  hydrochlorothiazide (HYDRODIURIL) 25 MG  tablet Take 1 tablet by mouth once daily 01/27/23   Massie Maroon, FNP  HYDROcodone-acetaminophen (NORCO/VICODIN) 5-325 MG tablet Take 1 tablet by mouth every 8 (eight) hours as needed. Patient not taking: Reported on 06/24/2022 05/07/21   Couture, Cortni S, PA-C  latanoprost (XALATAN) 0.005 % ophthalmic solution 1 drop at bedtime. 11/04/22   [provider]  lidocaine (LIDODERM) 5 % USE 1 PATCH EXTERNALLY ONCE DAILY (REMOVE  &  DISCARD  PATCH  WITHIN  12  HOURS  OR  AS  DIRECTED  BY  MD) Patient not taking: Reported on 11/01/2022 09/19/22   Massie Maroon, FNP  loratadine (CLARITIN) 10 MG tablet Take 1 tablet (10 mg total) by mouth daily. Patient not taking: Reported on 06/24/2022 05/04/21   Massie Maroon, FNP  meloxicam [facility]) 15 MG tablet Take 1 tablet by mouth once daily 02/10/23   Massie Maroon, FNP  metFORMIN (GLUCOPHAGE) 500 MG tablet Take 1 tablet (500 mg total) by mouth daily with breakfast. Patient not taking: Reported on 06/21/2022 02/11/22   Massie Maroon, FNP  Multiple Vitamin (MULTIVITAMIN WITH MINERALS) TABS tablet Take 1 tablet by mouth 4 (four) times a week.    [provider]  Na Sulfate-K Sulfate-Mg Sulf 17.5-3.13-1.6 GM/177ML SOLN Use as directed; may use generic; goodrx card if insurance will not cover generic 02/15/23   Imogene Burn, MD  ondansetron (ZOFRAN) 4 MG tablet Take 1 tablet (4 mg total) by mouth every  8 (eight) hours as needed for nausea or vomiting. Patient not taking: Reported on 11/18/2022 11/01/22   Massie Maroon, FNP  traZODone (DESYREL) 50 MG tablet Take 0.5 tablets (25 mg total) by mouth at bedtime as needed for sleep. Patient not taking: Reported on 11/18/2022 02/08/22   Massie Maroon, FNP  omeprazole (PRILOSEC) 40 MG capsule Take 1 capsule (40 mg total) by mouth daily. Patient not taking: Reported on 12/17/2019 09/17/19 05/06/20  Massie Maroon, FNP      Allergies    Lisinopril    Review of Systems   Review of Systems   Constitutional:  Negative for fever.  HENT:  Negative for hearing loss and tinnitus.   Eyes:  Negative for visual disturbance.  Respiratory:  Negative for shortness of breath.   Cardiovascular:  Negative for chest pain, palpitations and syncope.  Gastrointestinal:  Negative for diarrhea, nausea and vomiting.  Neurological:  Positive for dizziness and headaches. Negative for speech difficulty, weakness and numbness.    Physical Exam Updated Vital Signs BP (!) 177/106 (BP Location: Right Arm)   Pulse (!) 59   Temp 97.7 F (36.5 C) (Oral)   Resp 17   Ht 5\' 7"  (1.702 m)   Wt 122.5 kg   LMP 10/17/2016 (Exact Date)   SpO2 97%   BMI 42.29 kg/m  Physical Exam Vitals and nursing note reviewed.  Constitutional:      General: She is not in acute distress.    Appearance: Normal appearance. She is well-developed.  HENT:     Head: Normocephalic and atraumatic.  Eyes:     Extraocular Movements: Extraocular movements intact.     Conjunctiva/sclera: Conjunctivae normal.     Pupils: Pupils are equal, round, and reactive to light.     Comments: She has a few beats of horizontal nystagmus  Cardiovascular:     Rate and Rhythm: Normal rate and regular rhythm.     Heart sounds: No murmur heard. Pulmonary:     Effort: Pulmonary effort is normal. No respiratory distress.     Breath sounds: Normal breath sounds.  Abdominal:     Palpations: Abdomen is soft.     Tenderness: There is no abdominal tenderness. There is no guarding or rebound.  Musculoskeletal:        General: No deformity. Normal range of motion.     Cervical back: Neck supple.  Skin:    General: Skin is warm and dry.     Capillary Refill: Capillary refill takes less than 2 seconds.  Neurological:     General: No focal deficit present.     Mental Status: She is alert and oriented to person, place, and time.     Cranial Nerves: No cranial nerve deficit.     Sensory: No sensory deficit.     Motor: No weakness.     ED  Results / Procedures / Treatments   Labs (all labs ordered are listed, but only abnormal results are displayed) Labs Reviewed  BASIC METABOLIC PANEL - Abnormal; Notable for the following components:      Result Value   BUN 21 (*)    Creatinine, Ser 1.13 (*)    GFR, Estimated 58 (*)    All other components within normal limits  URINALYSIS, ROUTINE W REFLEX MICROSCOPIC - Abnormal; Notable for the following components:   Protein, ur TRACE (*)    All other components within normal limits  RESP PANEL BY RT-PCR (RSV, FLU A&B, COVID)  RVPGX2  CBC  WITH DIFFERENTIAL/PLATELET  CBG MONITORING, ED    EKG EKG Interpretation Date/Time:  Tuesday March 14 2023 10:54:47 EDT Ventricular Rate:  73 PR Interval:  182 QRS Duration:  109 QT Interval:  416 QTC Calculation: 459 R Axis:   84  Text Interpretation: Sinus rhythm ST elev, probable normal early repol pattern No significant change since prior 12/21 Confirmed by Meridee Score [phone]) on 03/14/2023 11:25:53 AM  Radiology CT ANGIO HEAD NECK W WO CM  Result Date: 03/14/2023 CLINICAL DATA:  Neuro deficit, acute, stroke suspected. Headache and dizziness beginning yesterday. Pressure in the right side of the head. EXAM: CT ANGIOGRAPHY HEAD AND NECK WITH AND WITHOUT CONTRAST TECHNIQUE: Multidetector CT imaging of the head and neck was performed using the standard protocol during bolus administration of intravenous contrast. Multiplanar CT image reconstructions and MIPs were obtained to evaluate the vascular anatomy. Carotid stenosis measurements (when applicable) are obtained utilizing NASCET criteria, using the distal internal carotid diameter as the denominator. RADIATION DOSE REDUCTION: This exam was performed according to the departmental dose-optimization program which includes automated exposure control, adjustment of the mA and/or kV according to patient size and/or use of iterative reconstruction technique. CONTRAST:  75mL OMNIPAQUE IOHEXOL  350 MG/ML SOLN COMPARISON:  Abigail Beasley Available. FINDINGS: CT HEAD FINDINGS Brain: The brain shows a normal appearance without evidence of malformation, atrophy, old or acute small or large vessel infarction, mass lesion, hemorrhage, hydrocephalus or extra-axial collection. Vascular: No hyperdense vessel. No evidence of atherosclerotic calcification. Skull: Normal.  No traumatic finding.  No focal bone lesion. Sinuses/Orbits: Sinuses are clear. Orbits appear normal. Mastoids are clear. Other: Abigail Beasley significant CTA NECK FINDINGS Aortic arch: Normal Right carotid system: Normal. Widely patent to the bifurcation. Minimal calcified plaque at the bifurcation but no stenosis. Cervical ICA widely patent. Left carotid system: Common carotid artery widely patent to the bifurcation. Minimal calcified plaque at the carotid bifurcation and ICA bulb but no stenosis. Cervical ICA normal beyond that. Vertebral arteries: Both vertebral artery origins are widely patent. Both vertebral arteries are normal through the cervical region to the foramen magnum. Skeleton: Ordinary cervical spondylosis. Other neck: No mass or lymphadenopathy. Upper chest: Normal Review of the MIP images confirms the above findings CTA HEAD FINDINGS Anterior circulation: Both internal carotid arteries widely patent through the skull base and siphon regions. The anterior and middle cerebral vessels are patent. No large vessel occlusion or proximal stenosis. No aneurysm or vascular malformation. Posterior circulation: Both vertebral arteries are widely patent through the foramen magnum to the basilar artery. Small focus of calcified plaque of the right vertebral artery V4 segment but no stenosis. The basilar artery is normal. Posterior circulation branch vessels are normal. Venous sinuses: Patent and normal. Anatomic variants: Abigail Beasley significant. Review of the MIP images confirms the above findings IMPRESSION: 1. Normal head CT. 2. Minimal calcified plaque at both  carotid bifurcations but no stenosis. 3. Minimal calcified plaque of the right vertebral artery V4 segment but no stenosis. 4. No intracranial large or medium vessel occlusion or correctable proximal stenosis. Electronically Signed   By: Paulina Fusi M.D.   On: 03/14/2023 13:57   DG Chest Port 1 View  Result Date: 03/14/2023 CLINICAL DATA:  Weakness and dizziness. EXAM: PORTABLE CHEST 1 VIEW COMPARISON:  Abigail Beasley Available. FINDINGS: The heart size and mediastinal contours are within normal limits. Both lungs are clear. The visualized skeletal structures are unremarkable. IMPRESSION: No active disease. Electronically Signed   By: Paulina Fusi M.D.   On: 03/14/2023 13:53  Procedures Procedures    Medications Ordered in ED Medications - No data to display  ED Course/ Medical Decision Making/ A&P Clinical Course as of 03/14/23 1711  Tue Mar 14, 2023  1127 Chest x-ray without definite infiltrate although left base obscured.  Awaiting radiology reading. [MB]  1201 Looking back at her prior notes she has a history of longstanding dizziness it has been going on for multiple years. [MB]  1407 CTA does not show any acute findings.  [MB]  1414 Patient's imaging and labs have been fairly unremarkable.  She is feeling better and is comfortable plan for discharge.  Recommended follow-up with PCP. [MB]    Clinical Course User Index [MB] Terrilee Files, MD                                 Medical Decision Making Amount and/or Complexity of Data Reviewed Labs: ordered. Radiology: ordered.  Risk Prescription drug management.   This patient complains of dizziness unsteadiness headache; this involves an extensive number of treatment Options and is a complaint that carries with it a high risk of complications and morbidity. The differential includes vertigo, viral syndrome, stroke, bleed, dehydration  I ordered, reviewed and interpreted labs, which included CBC normal chemistries with mildly  elevated creatinine urinalysis without signs of infection COVID and flu negative I ordered imaging studies which included CT angio head and neck chest x-ray and I independently    visualized and interpreted imaging which showed no acute findings Previous records obtained and reviewed in epic including recent PCP notes Cardiac monitoring reviewed, sinus rhythm Social determinants considered, multiple barriers including food insecurity depression and physically inactive Critical Interventions: Abigail Beasley  After the interventions stated above, I reevaluated the patient and found patient to be symptomatically improved.  She is ambulated in the department without any difficulty Admission and further testing considered, due to the fact that her symptoms are improved here and imaging and workup have not shown any significant findings feel she is appropriate for outpatient follow-up with PCP.  Recommended continued hydration.  Return instructions discussed.         Final Clinical Impression(s) / ED Diagnoses Final diagnoses:  Generalized headache  Dizziness    Rx / DC Orders ED Discharge Orders     Abigail Beasley         Terrilee Files, MD 03/14/23 [phone]

## 2023-03-14 NOTE — ED Notes (Signed)
Discharge instructions and follow up care reviewed and explained, pt verbalized understanding and had no further questions on d/c.

## 2023-03-14 NOTE — ED Triage Notes (Signed)
Pt arrived POV c/o dizziness since approx noon yesterday, and woke up this morning with pressure/pain to the R side of face/head and felt "off balance" when walking. Pt reports having brief episode of this approx 2 wks ago but that it went away after a couple hours. Pt denies any visual changes or extremity weakness. Pt denies any recent trauma to the head.

## 2023-04-04 ENCOUNTER — Encounter: Payer: Self-pay | Admitting: Family Medicine

## 2023-04-04 ENCOUNTER — Ambulatory Visit (INDEPENDENT_AMBULATORY_CARE_PROVIDER_SITE_OTHER): Payer: 59 | Admitting: Family Medicine

## 2023-04-04 VITALS — BP 142/95 | HR 98 | Temp 97.0°F | Wt 289.0 lb

## 2023-04-04 DIAGNOSIS — G8929 Other chronic pain: Secondary | ICD-10-CM

## 2023-04-04 DIAGNOSIS — R42 Dizziness and giddiness: Secondary | ICD-10-CM

## 2023-04-04 DIAGNOSIS — N182 Chronic kidney disease, stage 2 (mild): Secondary | ICD-10-CM | POA: Diagnosis not present

## 2023-04-04 DIAGNOSIS — R7303 Prediabetes: Secondary | ICD-10-CM

## 2023-04-04 DIAGNOSIS — G629 Polyneuropathy, unspecified: Secondary | ICD-10-CM

## 2023-04-04 DIAGNOSIS — Z23 Encounter for immunization: Secondary | ICD-10-CM | POA: Diagnosis not present

## 2023-04-04 DIAGNOSIS — E559 Vitamin D deficiency, unspecified: Secondary | ICD-10-CM | POA: Diagnosis not present

## 2023-04-04 DIAGNOSIS — M79605 Pain in left leg: Secondary | ICD-10-CM

## 2023-04-04 DIAGNOSIS — R600 Localized edema: Secondary | ICD-10-CM

## 2023-04-04 DIAGNOSIS — I10 Essential (primary) hypertension: Secondary | ICD-10-CM

## 2023-04-04 DIAGNOSIS — R635 Abnormal weight gain: Secondary | ICD-10-CM

## 2023-04-04 LAB — POCT GLYCOSYLATED HEMOGLOBIN (HGB A1C): Hemoglobin A1C: 5.9 % — AB (ref 4.0–5.6)

## 2023-04-04 MED ORDER — PREDNISONE 20 MG PO TABS
20.0000 mg | ORAL_TABLET | Freq: Every day | ORAL | 0 refills | Status: DC
Start: 2023-04-04 — End: 2023-07-04

## 2023-04-04 MED ORDER — METFORMIN HCL 500 MG PO TABS: 500.0000 mg | ORAL_TABLET | Freq: Every day | ORAL | 3 refills | Status: DC

## 2023-04-04 MED ORDER — GABAPENTIN 600 MG PO TABS
600.0000 mg | ORAL_TABLET | Freq: Two times a day (BID) | ORAL | 5 refills | Status: DC
Start: 2023-04-04 — End: 2023-07-04

## 2023-04-04 NOTE — Progress Notes (Signed)
Established Patient Office Visit  Subjective   Patient ID: Abigail Beasley, female    DOB: 1970/10/03  Age: 52 y.o. MRN: [id number]  Chief Complaint  Patient presents with   Hypertension    Abigail Beasley is avery pleasant 52 year old female with a medical history significant essential hypertension, morbid obesity, diabetes and chronic kidney disease presents for follow-up of her chronic conditions.  Patient has a primary complaint of left lower extremity pain.  Patient has a remote history of an injury to her left foot.  Since that time patient has had chronic left foot pain that has been unrelieved by NSAIDs, which are not recommended due to her history of chronic kidney disease.  Patient states that left lower extremity pain has been worsening with prolonged standing and applying full weight.  Patient has not been evaluated further by an orthopedic specialist for this problem.  Patient also has a history of essential hypertension.  She has been taking all prescribed medications consistently.  Patient does not exercise nor follow a balanced diet.  Patient does not check her blood pressure at home.  She endorses periodic lower extremity swelling.  She denies heart palpitations, chest pain, or shortness of breath.  Patient also has a history of prediabetes.  She was previously prescribed metformin, but did not start this medication.  She does not follow a carbohydrate modified diet.  She denies any blurry vision, polyuria, polydipsia, or polyphasia.  Hypertension    Patient Active Problem List   Diagnosis Date Noted   Colon cancer screening 02/28/2023   Trichomonas vaginitis 08/15/2020   Status post laparoscopic hysterectomy 11/08/2016   Severe obesity (BMI >= 40) (HCC) 07/28/2014   Dizziness and giddiness 03/18/2013   Essential hypertension, benign 03/18/2013   Breast pain, left 03/18/2013   Past Medical History:  Diagnosis Date   Allergy    Anxiety    Elevated serum creatinine 01/2019    History of proteinuria syndrome 01/2019   Hypertension    Left flank pain 01/2019   PONV (postoperative nausea and vomiting)    Past Surgical History:  Procedure Laterality Date   ABDOMINAL HYSTERECTOMY     COLONOSCOPY WITH PROPOFOL N/A 02/28/2023   Procedure: COLONOSCOPY WITH PROPOFOL;  Surgeon: Imogene Burn, MD;  Location: [facility];  Service: Gastroenterology;  Laterality: N/A;   CYSTOSCOPY N/A 11/08/2016   Procedure: CYSTOSCOPY;  Surgeon: Romualdo Bolk, MD;  Location: [facility];  Service: Gynecology;  Laterality: N/A;   DILATION AND EVACUATION     miscarriage   TOTAL LAPAROSCOPIC HYSTERECTOMY WITH SALPINGECTOMY Bilateral 11/08/2016   Procedure: HYSTERECTOMY TOTAL LAPAROSCOPIC WITH BILATERAL SALPINGECTOMY;  Surgeon: Romualdo Bolk, MD;  Location: [facility];  Service: Gynecology;  Laterality: Bilateral;   TUBAL LIGATION     Social History   Tobacco Use   Smoking status: Never   Smokeless tobacco: Never  Vaping Use   Vaping status: Never Used  Substance Use Topics   Alcohol use: Yes    Alcohol/week: 3.0 standard drinks of alcohol    Types: 3 Standard drinks or equivalent per week    Comment: occ   Drug use: Not Currently    Types: Marijuana    Comment: occ   Social History   Socioeconomic History   Marital status: Single    Spouse name: Not on file   Number of children: Not on file   Years of education: Not on file   Highest education level: GED or equivalent  Occupational History  Not on file  Tobacco Use   Smoking status: Never   Smokeless tobacco: Never  Vaping Use   Vaping status: Never Used  Substance and Sexual Activity   Alcohol use: Yes    Alcohol/week: 3.0 standard drinks of alcohol    Types: 3 Standard drinks or equivalent per week    Comment: occ   Drug use: Not Currently    Types: Marijuana    Comment: occ   Sexual activity: Not Currently    Partners: Male    Birth control/protection: Surgical  Other Topics Concern   Not on file   Social History Narrative   Marital status: single; not dating.      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      Tobacco: none      Alcohol:  Socially; rarely.      Drugs: none; marijuana in the past.      Exercise: none      History of incarceration for six years (guilt by association; friend robbed bank).      Sexual activity:  Males; last STD Gonorrhea at age 77.  Syphilis in twenties.    Social Determinants of Health   Financial Resource Strain: High Risk (04/01/2023)   Overall Financial Resource Strain (CARDIA)    Difficulty of Paying Living Expenses: Very hard  Food Insecurity: Food Insecurity Present (04/01/2023)   Hunger Vital Sign    Worried About Running Out of Food in the Last Year: Often true    Ran Out of Food in the Last Year: Often true  Transportation Needs: No Transportation Needs (04/01/2023)   PRAPARE - Administrator, Civil Service (Medical): No    Lack of Transportation (Non-Medical): No  Physical Activity: Inactive (09/23/2022)   Exercise Vital Sign    Days of Exercise per Week: 1 day    Minutes of Exercise per Session: 0 min  Stress: No Stress Concern Present (09/23/2022)   [employer] of Occupational Health - Occupational Stress Questionnaire    Feeling of Stress : Only a little  Social Connections: Moderately Isolated (04/01/2023)   Social Connection and Isolation Panel [NHANES]    Frequency of Communication with Friends and Family: More than three times a week    Frequency of Social Gatherings with Friends and Family: More than three times a week    Attends Religious Services: 1 to 4 times per year    Active Member of [employer] or Organizations: No    Attends Engineer, structural: Not on file    Marital Status: Never married  Intimate Partner Violence: Not on file   Family Status  Relation Name Status   Mother  Deceased at age 16        cancer liver   Father  Alive       74   Sister  Alive   Sister  Manufacturing engineer  (Not Specified)   Nutritional therapist  (Not Specified)   MGM  Alive   MGF  Deceased   PGM  Deceased   Neg Hx  (Not Specified)  No partnership data on file   Family History  Problem Relation Age of Onset   Diabetes Mother    Hypertension Mother    Cancer Mother        liver    Hyperlipidemia Mother    Stroke Mother  Hypertension Sister    Diabetes Sister    Diabetes Maternal Uncle    Colon polyps Paternal Uncle    Colon cancer Paternal Uncle    Diabetes Maternal Grandmother    Hypertension Maternal Grandmother    Hyperlipidemia Maternal Grandmother    Heart disease Maternal Grandmother    Colon polyps Maternal Grandfather    Cancer Maternal Grandfather        prostate   Diabetes Maternal Grandfather    Hyperlipidemia Maternal Grandfather    Hypertension Maternal Grandfather    Colon cancer Maternal Grandfather    Breast cancer Neg Hx    Esophageal cancer Neg Hx    Rectal cancer Neg Hx    Stomach cancer Neg Hx    Allergies  Allergen Reactions   Lisinopril Cough      Review of Systems  Constitutional: Negative.   HENT: Negative.    Eyes: Negative.   Cardiovascular:  Positive for leg swelling.  Gastrointestinal: Negative.  Negative for nausea and vomiting.  Genitourinary: Negative.   Musculoskeletal:  Positive for joint pain.  Skin: Negative.   Neurological: Negative.   Endo/Heme/Allergies: Negative.   Psychiatric/Behavioral: Negative.        Objective:     BP (!) 142/95   Pulse 98   Temp (!) 97 F (36.1 C)   Wt 289 lb (131.1 kg)   LMP 10/17/2016 (Exact Date)   SpO2 100%   BMI 45.26 kg/m  BP Readings from Last 3 Encounters:  04/04/23 (!) 142/95  03/14/23 (!) 153/92  02/28/23 (!) 170/98   Wt Readings from Last 3 Encounters:  04/04/23 289 lb (131.1 kg)  03/14/23 270 lb (122.5 kg)  02/28/23 268 lb 15.4 oz (122 kg)      Physical Exam Constitutional:       Appearance: She is obese.  Eyes:     Pupils: Pupils are equal, round, and reactive to light.  Cardiovascular:     Rate and Rhythm: Normal rate and regular rhythm.     Pulses: Normal pulses.  Abdominal:     General: Bowel sounds are normal.  Musculoskeletal:     Right lower leg: Edema present.     Left lower leg: Edema present.  Neurological:     General: No focal deficit present.     Mental Status: She is alert. Mental status is at baseline.  Psychiatric:        Mood and Affect: Mood normal.        Thought Content: Thought content normal.        Judgment: Judgment normal.      Results for orders placed or performed in visit on 04/04/23  Thyroid Panel With TSH  Result Value Ref Range   TSH 1.750 0.450 - 4.500 uIU/mL   T4, Total 6.2 4.5 - 12.0 ug/dL   T3 Uptake Ratio 27 24 - 39 %   Free Thyroxine Index 1.7 1.2 - 4.9  Comprehensive metabolic panel  Result Value Ref Range   Glucose 100 (H) 70 - 99 mg/dL   BUN 22 6 - 24 mg/dL   Creatinine, Ser 1.61 (H) 0.57 - 1.00 mg/dL   eGFR 60 >09 UE/AVW/0.98   BUN/Creatinine Ratio 20 9 - 23   Sodium 140 134 - 144 mmol/L   Potassium 4.3 3.5 - 5.2 mmol/L   Chloride 104 96 - 106 mmol/L   CO2 23 20 - 29 mmol/L   Calcium 9.6 8.7 - 10.2 mg/dL   Total Protein 7.0 6.0 - 8.5  g/dL   Albumin 4.2 3.8 - 4.9 g/dL   Globulin, Total 2.8 1.5 - 4.5 g/dL   Bilirubin Total 0.3 0.0 - 1.2 mg/dL   Alkaline Phosphatase 89 44 - 121 IU/L   AST 18 0 - 40 IU/L   ALT 16 0 - 32 IU/L  Brain natriuretic peptide  Result Value Ref Range   BNP 17.5 0.0 - 100.0 pg/mL  HgB A1c  Result Value Ref Range   Hemoglobin A1C 5.9 (A) 4.0 - 5.6 %   HbA1c POC (<> result, manual entry)     HbA1c, POC (prediabetic range)     HbA1c, POC (controlled diabetic range)      Last CBC Lab Results  Component Value Date   WBC 6.6 03/14/2023   HGB 14.2 03/14/2023   HCT 42.4 03/14/2023   MCV 90.4 03/14/2023   MCH 30.3 03/14/2023   RDW 12.6 03/14/2023   PLT 279 03/14/2023    Last metabolic panel Lab Results  Component Value Date   GLUCOSE 100 (H) 04/04/2023   NA 140 04/04/2023   K 4.3 04/04/2023   CL 104 04/04/2023   CO2 23 04/04/2023   BUN 22 04/04/2023   CREATININE 1.10 (H) 04/04/2023   GFRNONAA 58 (L) 03/14/2023   CALCIUM 9.6 04/04/2023   PHOS 2.9 11/15/2016   PROT 7.0 04/04/2023   ALBUMIN 4.2 04/04/2023   LABGLOB 2.8 04/04/2023   AGRATIO 1.3 02/08/2022   BILITOT 0.3 04/04/2023   ALKPHOS 89 04/04/2023   AST 18 04/04/2023   ALT 16 04/04/2023   ANIONGAP 9 03/14/2023   Last lipids Lab Results  Component Value Date   CHOL 170 08/11/2020   HDL 61 08/11/2020   LDLCALC 94 08/11/2020   TRIG 83 08/11/2020   CHOLHDL 2.8 08/11/2020   Last hemoglobin A1c Lab Results  Component Value Date   HGBA1C 5.9 (A) 04/04/2023   Last thyroid functions Lab Results  Component Value Date   TSH 1.750 04/04/2023   T4TOTAL 6.2 04/04/2023   Last vitamin D Lab Results  Component Value Date   VD25OH CANCELED 06/24/2022   Last vitamin B12 and Folate Lab Results  Component Value Date   VITAMINB12 579 01/26/2021      The 10-year ASCVD risk score (Arnett DK, et al., 2019) is: 5.1%    Assessment & Plan:   Problem List Items Addressed This Visit       Cardiovascular and Mediastinum   Essential hypertension, benign - Primary   Relevant Orders   Basic Metabolic Panel   Orthostatic vital signs   EKG 12-Lead   Comprehensive metabolic panel (Completed)     Other   Severe obesity (BMI >= 40) (HCC)   Relevant Medications   metFORMIN (GLUCOPHAGE) 500 MG tablet   Other Visit Diagnoses     Chronic pain of left lower extremity       Relevant Medications   predniSONE (DELTASONE) 20 MG tablet   gabapentin (NEURONTIN) 600 MG tablet   Other Relevant Orders   AMB referral to orthopedics   Vitamin D deficiency       Stage 2 chronic kidney disease       Relevant Orders   Comprehensive metabolic panel (Completed)   Influenza vaccine needed        Relevant Orders   Flu vaccine trivalent PF, 6mos and older(Flulaval,Afluria,Fluarix,Fluzone) (Completed)   Prediabetes       Relevant Medications   metFORMIN (GLUCOPHAGE) 500 MG tablet   Other Relevant Orders   HgB  A1c (Completed)   EKG 12-Lead   Rapid weight gain       Relevant Orders   Thyroid Panel With TSH (Completed)   Dizziness       Relevant Orders   EKG 12-Lead   Neuropathy       Relevant Medications   gabapentin (NEURONTIN) 600 MG tablet   Bilateral lower extremity edema       Relevant Orders   Comprehensive metabolic panel (Completed)   Brain natriuretic peptide (Completed)       1. Essential hypertension, benign BP (!) 142/95   Pulse 98   Temp (!) 97 F (36.1 C)   Wt 289 lb (131.1 kg)   LMP 10/17/2016 (Exact Date)   SpO2 100%   BMI 45.26 kg/m  - Continue medication, monitor blood pressure at home. Continue DASH diet.  Reminder to go to the ER if any CP, SOB, nausea, dizziness, severe HA, changes vision/speech, left arm numbness and tingling and jaw pain.   - Basic Metabolic Panel - Orthostatic vital signs - EKG 12-Lead - Comprehensive metabolic panel  2. Chronic pain of left lower extremity  - predniSONE (DELTASONE) 20 MG tablet; Take 1 tablet (20 mg total) by mouth daily with breakfast.  Dispense: 4 tablet; Refill: 0 - AMB referral to orthopedics  3. Vitamin D deficiency We will recheck vitamin D level at 20-month follow-up.  4. Stage 2 chronic kidney disease Has been stable over the last several visits.  Patient has not followed by nephrology at this time. - Comprehensive metabolic panel  5. Severe obesity (BMI >= 40) (HCC) The patient is asked to make an attempt to improve diet and exercise patterns to aid in medical management of this problem.   6. Influenza vaccine needed  - Flu vaccine trivalent PF, 6mos and older(Flulaval,Afluria,Fluarix,Fluzone)  7. Prediabetes  - HgB A1c - EKG 12-Lead - metFORMIN (GLUCOPHAGE) 500 MG tablet; Take 1  tablet (500 mg total) by mouth daily with breakfast.  Dispense: 180 tablet; Refill: 3  8. Rapid weight gain  - Thyroid Panel With TSH  9. Dizziness  - EKG 12-Lead  10. Neuropathy  - gabapentin (NEURONTIN) 600 MG tablet; Take 1 tablet (600 mg total) by mouth 2 (two) times daily.  Dispense: 60 tablet; Refill: 5  11. Bilateral lower extremity edema  - Comprehensive metabolic panel - Brain natriuretic peptide   Abigail Beasley will follow-up in 3 months for chronic conditions.  Nolon Nations  APRN, MSN, FNP-C [facility] [address] [place], [place] [address] [phone]

## 2023-04-06 ENCOUNTER — Other Ambulatory Visit: Payer: Self-pay | Admitting: Family Medicine

## 2023-04-06 LAB — COMPREHENSIVE METABOLIC PANEL
ALT: 16 [IU]/L (ref 0–32)
AST: 18 [IU]/L (ref 0–40)
Albumin: 4.2 g/dL (ref 3.8–4.9)
Alkaline Phosphatase: 89 [IU]/L (ref 44–121)
BUN/Creatinine Ratio: 20 (ref 9–23)
BUN: 22 mg/dL (ref 6–24)
Bilirubin Total: 0.3 mg/dL (ref 0.0–1.2)
CO2: 23 mmol/L (ref 20–29)
Calcium: 9.6 mg/dL (ref 8.7–10.2)
Chloride: 104 mmol/L (ref 96–106)
Creatinine, Ser: 1.1 mg/dL — ABNORMAL HIGH (ref 0.57–1.00)
Globulin, Total: 2.8 g/dL (ref 1.5–4.5)
Glucose: 100 mg/dL — ABNORMAL HIGH (ref 70–99)
Potassium: 4.3 mmol/L (ref 3.5–5.2)
Sodium: 140 mmol/L (ref 134–144)
Total Protein: 7 g/dL (ref 6.0–8.5)
eGFR: 60 mL/min/{1.73_m2} (ref >59)

## 2023-04-06 LAB — THYROID PANEL WITH TSH
Free Thyroxine Index: 1.7 (ref 1.2–4.9)
T3 Uptake Ratio: 27 % (ref 24–39)
T4, Total: 6.2 ug/dL (ref 4.5–12.0)
TSH: 1.75 u[IU]/mL (ref 0.450–4.500)

## 2023-04-06 LAB — BRAIN NATRIURETIC PEPTIDE: BNP: 17.5 pg/mL (ref 0.0–100.0)

## 2023-04-24 IMAGING — US US ABDOMEN COMPLETE
1 series · 14 of 25 positions shown · non-contrast
Comparison: CT March 16, 2012.

CLINICAL DATA: One month of left-sided abdominal pain.

EXAM:
ABDOMEN ULTRASOUND COMPLETE

[Series 1: us abdomen complete · 14 of 127 slices shown]
[im 1/127]
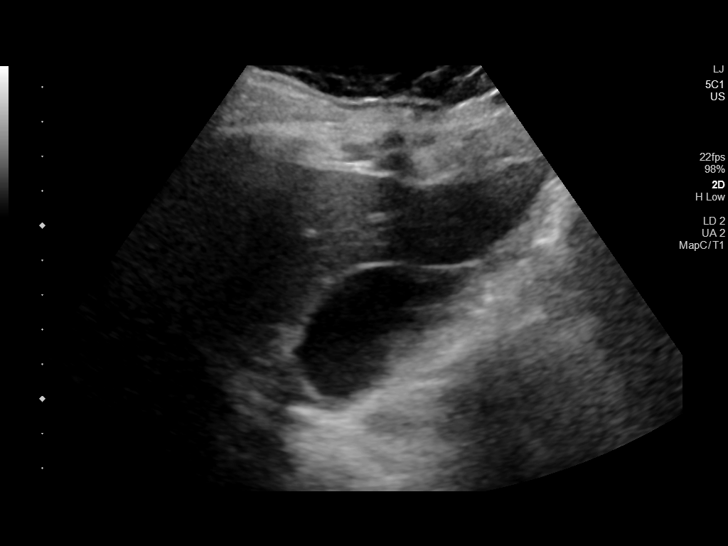
[im 11/127]
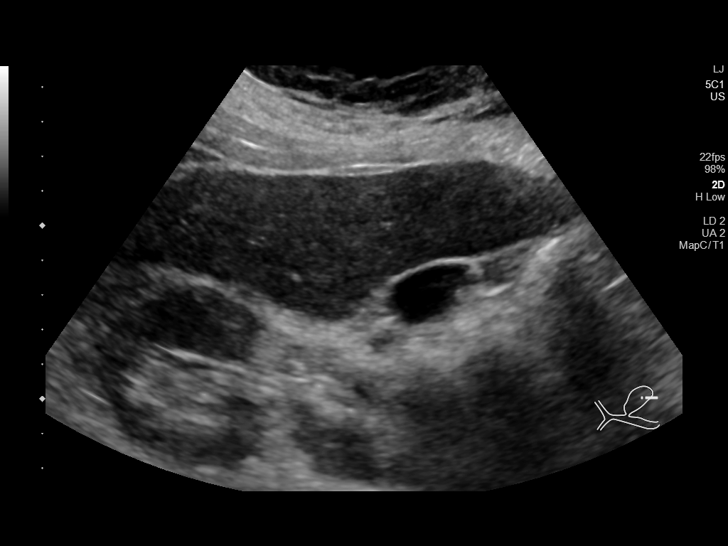
[im 22/127]
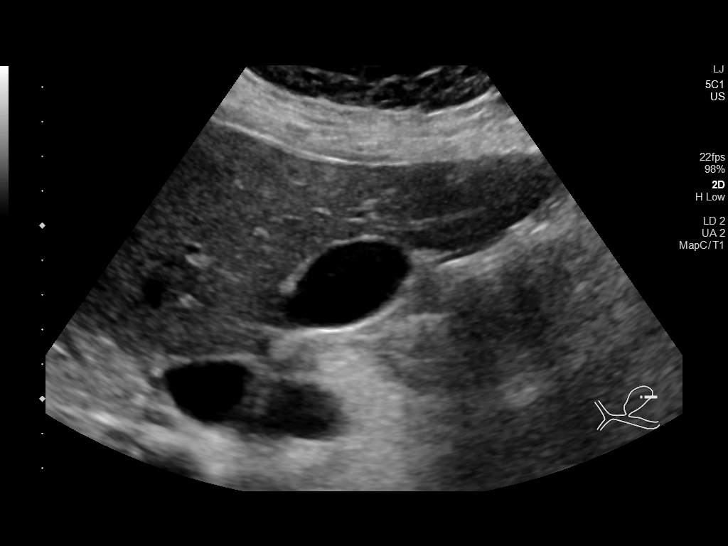
[im 32/127]
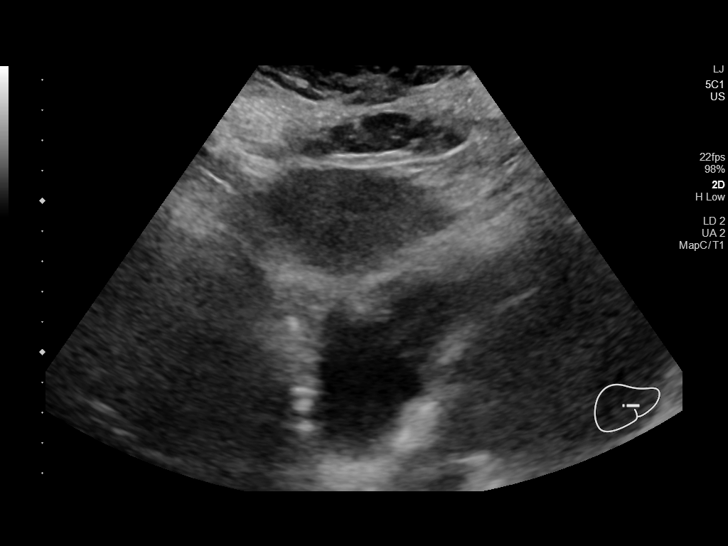
[im 43/127]
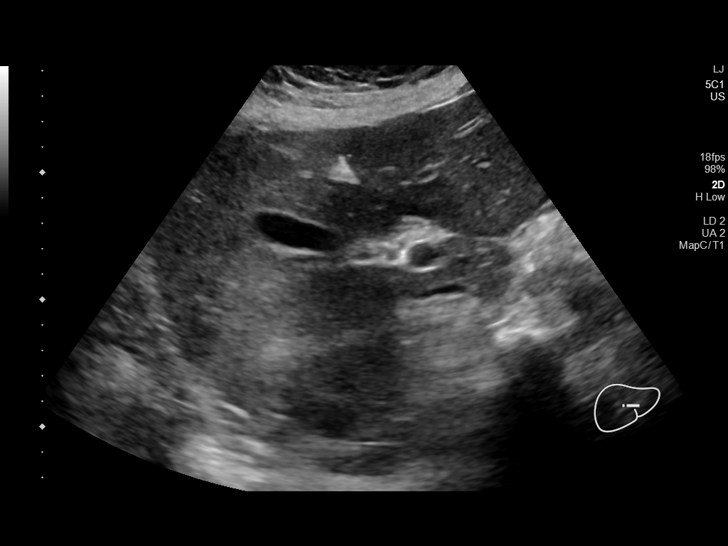
[im 48/127]
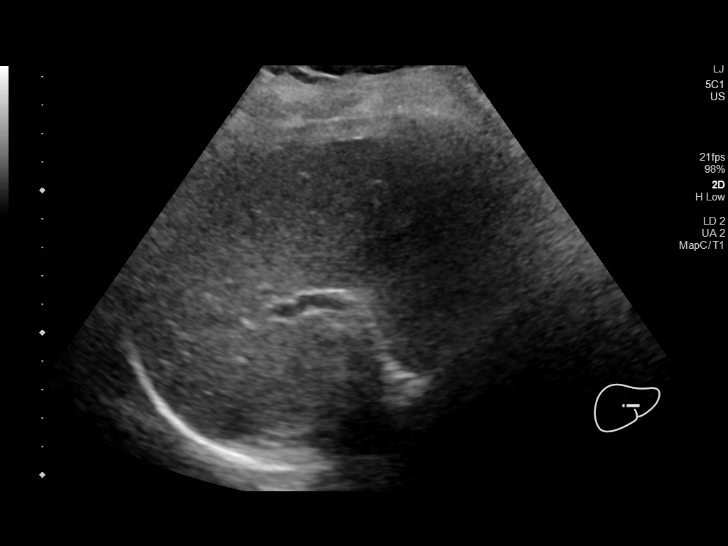
[im 58/127]
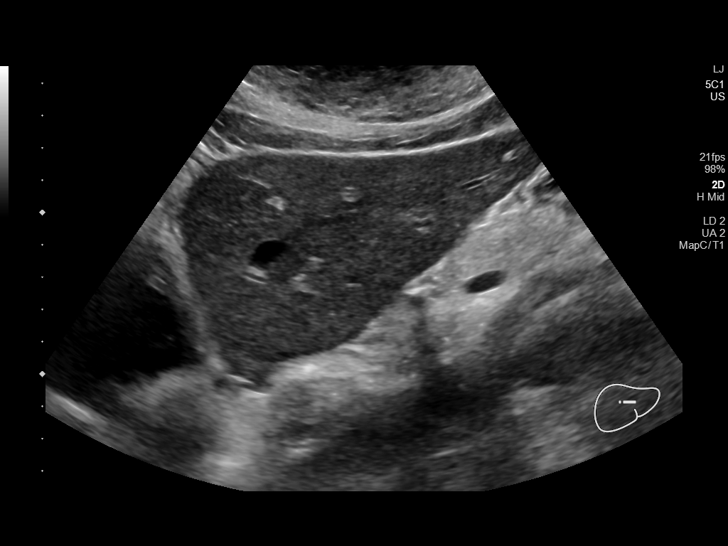
[im 69/127]
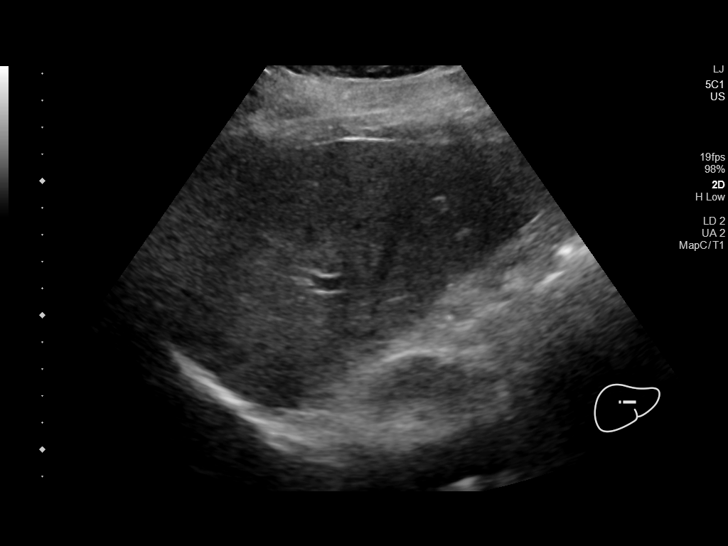
[im 79/127]
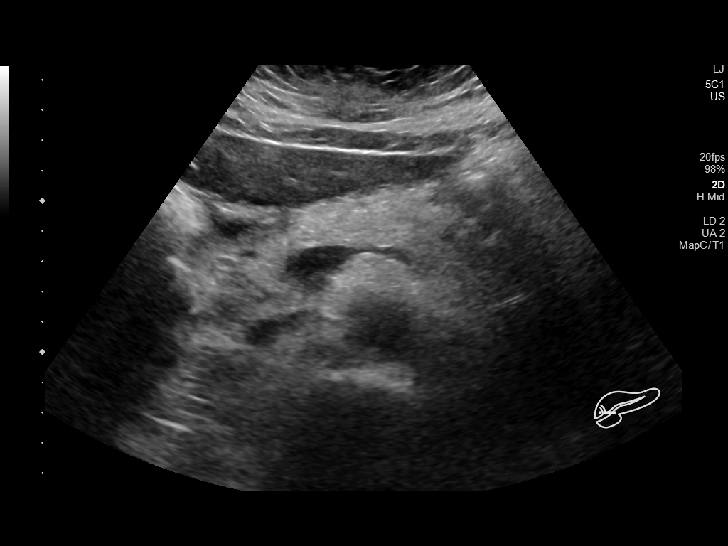
[im 85/127]
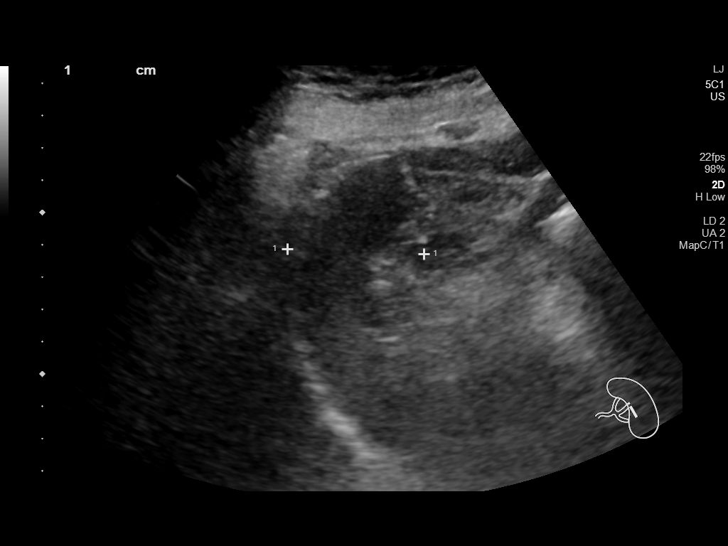
[im 95/127]
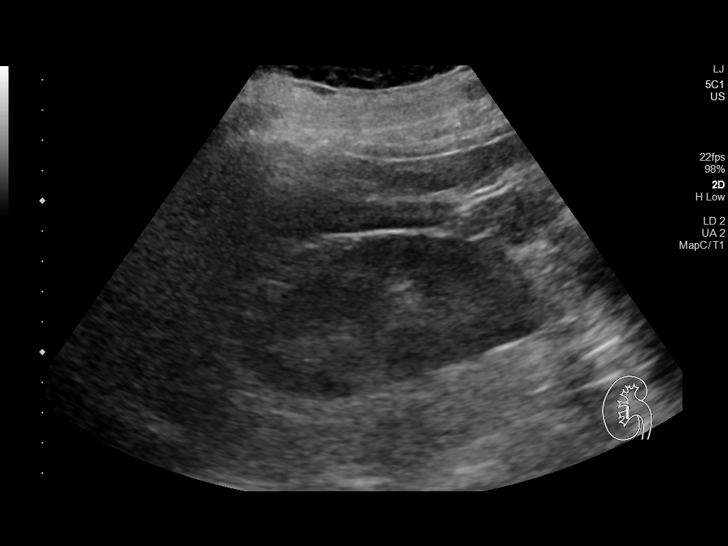
[im 106/127]
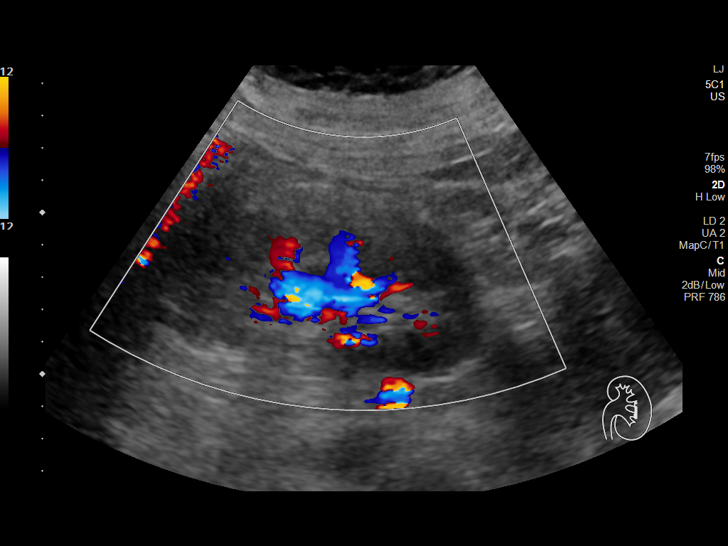
[im 116/127]
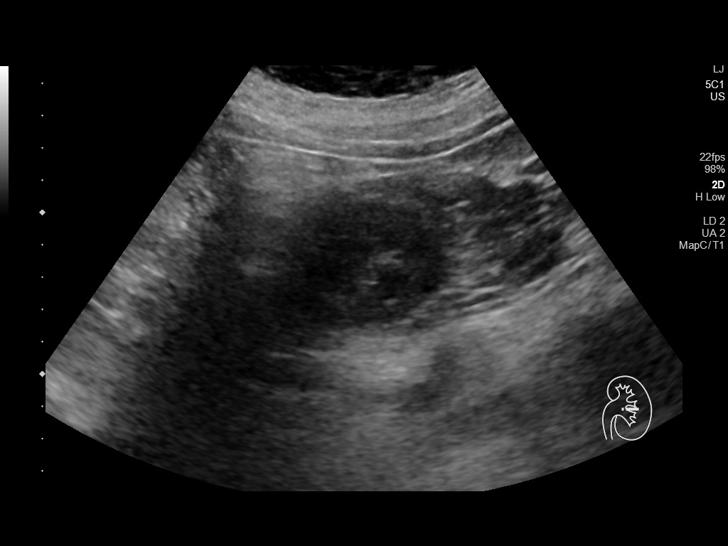
[im 127/127]
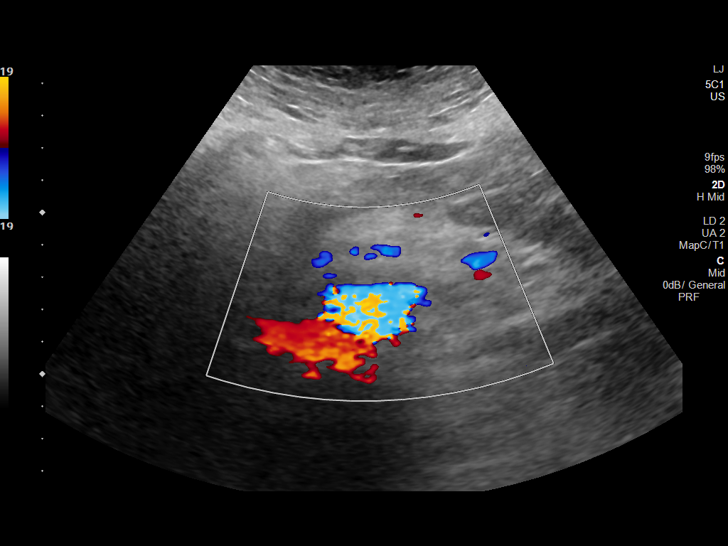

[14 of 25 positions shown; findings below may reference images not displayed]

FINDINGS: Gallbladder: No gallstones or wall thickening visualized. No
sonographic Murphy sign noted by sonographer.

Common bile duct: Diameter: 2 mm

Liver: Hyperechoic 1.5 cm mass in the right lobe of the liver,
corresponding with the hypoechoic lesion seen on prior CT reflecting
a benign hepatic hemangioma. Within normal limits in parenchymal
echogenicity. Portal vein is patent on color Doppler imaging with
normal direction of blood flow towards the liver.

IVC: No abnormality visualized.

Pancreas: Visualized portion unremarkable.

Spleen: Size and appearance within normal limits.

Right Kidney: Length: 11.4 cm. Echogenicity within normal limits. No
mass or hydronephrosis visualized.

Left Kidney: Length: 10.3 cm. Echogenicity within normal limits. No
mass or hydronephrosis visualized.

Abdominal aorta: No aneurysm visualized.

Other findings: None.
IMPRESSION: 1.5 cm benign hepatic hemangioma. Otherwise normal abdominal
ultrasound.

## 2023-04-29 ENCOUNTER — Other Ambulatory Visit: Payer: Self-pay | Admitting: Family Medicine

## 2023-05-13 ENCOUNTER — Other Ambulatory Visit: Payer: Self-pay | Admitting: Family Medicine

## 2023-05-13 DIAGNOSIS — I10 Essential (primary) hypertension: Secondary | ICD-10-CM

## 2023-05-19 ENCOUNTER — Other Ambulatory Visit (HOSPITAL_COMMUNITY): Payer: Self-pay | Admitting: Nurse Practitioner

## 2023-05-19 ENCOUNTER — Ambulatory Visit (HOSPITAL_BASED_OUTPATIENT_CLINIC_OR_DEPARTMENT_OTHER): Payer: Self-pay | Admitting: Orthopaedic Surgery

## 2023-05-19 ENCOUNTER — Other Ambulatory Visit: Payer: Self-pay | Admitting: Family Medicine

## 2023-05-19 ENCOUNTER — Encounter (HOSPITAL_BASED_OUTPATIENT_CLINIC_OR_DEPARTMENT_OTHER): Payer: Self-pay

## 2023-05-19 ENCOUNTER — Other Ambulatory Visit: Payer: Self-pay | Admitting: Nurse Practitioner

## 2023-05-19 DIAGNOSIS — I10 Essential (primary) hypertension: Secondary | ICD-10-CM

## 2023-05-19 MED ORDER — MELOXICAM 15 MG PO TABS: 15.0000 mg | ORAL_TABLET | Freq: Every day | ORAL | 0 refills | Status: DC | PRN

## 2023-05-19 NOTE — Telephone Encounter (Signed)
Please advise Kh

## 2023-06-26 ENCOUNTER — Other Ambulatory Visit: Payer: Self-pay | Admitting: Nurse Practitioner

## 2023-06-26 NOTE — Telephone Encounter (Signed)
Please advise [facility]

## 2023-07-04 ENCOUNTER — Ambulatory Visit (INDEPENDENT_AMBULATORY_CARE_PROVIDER_SITE_OTHER): Payer: Self-pay | Admitting: Nurse Practitioner

## 2023-07-04 ENCOUNTER — Encounter: Payer: Self-pay | Admitting: Nurse Practitioner

## 2023-07-04 VITALS — BP 142/82 | HR 76 | Temp 97.4°F | Wt 292.0 lb

## 2023-07-04 DIAGNOSIS — G629 Polyneuropathy, unspecified: Secondary | ICD-10-CM | POA: Insufficient documentation

## 2023-07-04 DIAGNOSIS — R7303 Prediabetes: Secondary | ICD-10-CM | POA: Insufficient documentation

## 2023-07-04 DIAGNOSIS — F32A Depression, unspecified: Secondary | ICD-10-CM

## 2023-07-04 DIAGNOSIS — M79641 Pain in right hand: Secondary | ICD-10-CM | POA: Insufficient documentation

## 2023-07-04 DIAGNOSIS — F419 Anxiety disorder, unspecified: Secondary | ICD-10-CM

## 2023-07-04 DIAGNOSIS — I10 Essential (primary) hypertension: Secondary | ICD-10-CM

## 2023-07-04 DIAGNOSIS — M79642 Pain in left hand: Secondary | ICD-10-CM

## 2023-07-04 MED ORDER — AMLODIPINE BESYLATE 2.5 MG PO TABS: 2.5000 mg | ORAL_TABLET | Freq: Every day | ORAL | 1 refills | Status: DC

## 2023-07-04 MED ORDER — HYDROCHLOROTHIAZIDE 25 MG PO TABS: 25.0000 mg | ORAL_TABLET | Freq: Every day | ORAL | 1 refills | Status: DC

## 2023-07-04 MED ORDER — AMLODIPINE BESYLATE 5 MG PO TABS: 5.0000 mg | ORAL_TABLET | Freq: Every day | ORAL | 1 refills | Status: DC

## 2023-07-04 MED ORDER — GABAPENTIN 600 MG PO TABS: 600.0000 mg | ORAL_TABLET | Freq: Three times a day (TID) | ORAL | 5 refills | Status: DC

## 2023-07-04 NOTE — Assessment & Plan Note (Signed)
Gabapentin increased to 600 mg 3 times daily

## 2023-07-04 NOTE — Assessment & Plan Note (Addendum)
 Chronic uncontrolled condition Continue hydrochlorothiazide  25 mg daily increase amlodipine  to 7.5 mg daily, encouraged to monitor blood pressure at home keep a log and bring to next visit in 4 weeks  DASH diet and commitment to daily physical activity for a minimum of 30 minutes discussed and encouraged, as a part of hypertension management. The importance of attaining a healthy weight is also discussed.     07/04/2023   10:58 AM 07/04/2023   10:42 AM 04/04/2023   10:57 AM 04/04/2023   10:50 AM 03/14/2023    2:30 PM 03/14/2023    1:30 PM 03/14/2023   12:00 PM  BP/Weight  Systolic BP 142 147 142 153 153 172 171  Diastolic BP 82 86 95 93 92 89 96  Wt. (Lbs)  292  289     BMI  45.73 kg/m2  45.26 kg/m2

## 2023-07-04 NOTE — Assessment & Plan Note (Addendum)
 Wt Readings from Last 3 Encounters:  07/04/23 292 lb (132.5 kg)  04/04/23 289 lb (131.1 kg)  03/14/23 270 lb (122.5 kg)   Body mass index is 45.73 kg/m.  Patient counseled on low-carb diet Encouraged to engage in regular moderate to vigorous exercises at least 150 minutes weekly as tolerated

## 2023-07-04 NOTE — Progress Notes (Signed)
 Established Patient Office Visit  Subjective:  Patient ID: Abigail Beasley, female    DOB: 09-17-1970  Age: 53 y.o. MRN: [id number]  CC:  Chief Complaint  Patient presents with   Medical Management of Chronic Issues   Hypertension    HPI Abigail Beasley is a 53 y.o. female   has a past medical history of Allergy, Anxiety, Elevated serum creatinine (01/2019), History of proteinuria syndrome (01/2019), Hypertension, Left flank pain (01/2019), and PONV (postoperative nausea and vomiting).  Patient presents for follow-up for her chronic medical conditions and to establish care with new provider for management of her chronic medical conditions  Hypertension.  Currently on amlodipine  5 mg daily, hydrochlorothiazide  25 mg daily.  Patient denies dizziness, chest pain, edema.  Bilateral hand pain.  Patient complains of bilateral hands pain and cramping radiating towards her arms bilaterally.  Pain is worse at night.  Takes gabapentin  600 mg twice daily.  She currently denies pain  Anxiety and depression.  Stated that she is going through some issues with her friends and daughters.  She denies SI, HI.  She was on trazodone  but she stopped taking the medication.  She is interested in counseling does not want to restart medication.    Past Medical History:  Diagnosis Date   Allergy    Anxiety    Elevated serum creatinine 01/2019   History of proteinuria syndrome 01/2019   Hypertension    Left flank pain 01/2019   PONV (postoperative nausea and vomiting)     Past Surgical History:  Procedure Laterality Date   ABDOMINAL HYSTERECTOMY     COLONOSCOPY WITH PROPOFOL  N/A 02/28/2023   Procedure: COLONOSCOPY WITH PROPOFOL ;  Surgeon: Federico Rosario JAYSON, MD;  Location: [facility];  Service: Gastroenterology;  Laterality: N/A;   CYSTOSCOPY N/A 11/08/2016   Procedure: CYSTOSCOPY;  Surgeon: Jannis Kate Norris, MD;  Location: [facility];  Service: Gynecology;  Laterality: N/A;   DILATION AND EVACUATION      miscarriage   TOTAL LAPAROSCOPIC HYSTERECTOMY WITH SALPINGECTOMY Bilateral 11/08/2016   Procedure: HYSTERECTOMY TOTAL LAPAROSCOPIC WITH BILATERAL SALPINGECTOMY;  Surgeon: Jannis Kate Norris, MD;  Location: [facility];  Service: Gynecology;  Laterality: Bilateral;   TUBAL LIGATION      Family History  Problem Relation Age of Onset   Diabetes Mother    Hypertension Mother    Cancer Mother        liver    Hyperlipidemia Mother    Stroke Mother    Hypertension Sister    Diabetes Sister    Diabetes Maternal Uncle    Colon polyps Paternal Uncle    Colon cancer Paternal Uncle    Diabetes Maternal Grandmother    Hypertension Maternal Grandmother    Hyperlipidemia Maternal Grandmother    Heart disease Maternal Grandmother    Colon polyps Maternal Grandfather    Cancer Maternal Grandfather        prostate   Diabetes Maternal Grandfather    Hyperlipidemia Maternal Grandfather    Hypertension Maternal Grandfather    Colon cancer Maternal Grandfather    Breast cancer Neg Hx    Esophageal cancer Neg Hx    Rectal cancer Neg Hx    Stomach cancer Neg Hx     Social History   Socioeconomic History   Marital status: Single    Spouse name: Not on file   Number of children: Not on file   Years of education: Not on file   Highest education level: 9th grade  Occupational  History   Not on file  Tobacco Use   Smoking status: Never   Smokeless tobacco: Never  Vaping Use   Vaping status: Never Used  Substance and Sexual Activity   Alcohol use: Yes    Alcohol/week: 3.0 standard drinks of alcohol    Types: 3 Standard drinks or equivalent per week    Comment: occ   Drug use: Not Currently    Types: Marijuana    Comment: occ   Sexual activity: Not Currently    Partners: Male    Birth control/protection: Surgical  Other Topics Concern   Not on file  Social History Narrative   Marital status: single; not dating.      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      Tobacco: none      Alcohol:  Socially; rarely.      Drugs: none; marijuana in the past.      Exercise: none      History of incarceration for six years (guilt by association; friend robbed bank).      Sexual activity:  Males; last STD Gonorrhea at age 1.  Syphilis in twenties.    Social Drivers of Health   Financial Resource Strain: High Risk (07/01/2023)   Overall Financial Resource Strain (CARDIA)    Difficulty of Paying Living Expenses: Hard  Food Insecurity: Food Insecurity Present (07/01/2023)   Hunger Vital Sign    Worried About Running Out of Food in the Last Year: Sometimes true    Ran Out of Food in the Last Year: Sometimes true  Transportation Needs: Unmet Transportation Needs (07/01/2023)   PRAPARE - Transportation    Lack of Transportation (Medical): No    Lack of Transportation (Non-Medical): Yes  Physical Activity: Insufficiently Active (07/01/2023)   Exercise Vital Sign    Days of Exercise per Week: 2 days    Minutes of Exercise per Session: 10 min  Stress: Stress Concern Present (07/01/2023)   [employer] of Occupational Health - Occupational Stress Questionnaire    Feeling of Stress : Very much  Social Connections: Socially Isolated (07/01/2023)   Social Connection and Isolation Panel [NHANES]    Frequency of Communication with Friends and Family: More than three times a week    Frequency of Social Gatherings with Friends and Family: Twice a week    Attends Religious Services: Never    Database Administrator or Organizations: No    Attends Engineer, Structural: Not on file    Marital Status: Never married  Intimate Partner Violence: Not on file    Outpatient Medications Prior to Visit  Medication Sig Dispense Refill   dorzolamide-timolol (COSOPT) 2-0.5 % ophthalmic solution 1 drop 2 (two) times daily.     latanoprost (XALATAN) 0.005 % ophthalmic solution 1 drop at bedtime.     meloxicam   (MOBIC ) 15 MG tablet TAKE 1 TABLET BY MOUTH ONCE DAILY AS NEEDED FOR PAIN 30 tablet 0   Multiple Vitamin (MULTIVITAMIN WITH MINERALS) TABS tablet Take 1 tablet by mouth 4 (four) times a week.     amLODipine  (NORVASC ) 5 MG tablet Take 1 tablet by mouth once daily 90 tablet 0   gabapentin  (NEURONTIN ) 600 MG tablet Take 1 tablet (600 mg total) by mouth 2 (two) times daily. 60 tablet 5   hydrochlorothiazide  (HYDRODIURIL ) 25 MG tablet Take 1 tablet by mouth once  daily 90 tablet 0   Ferrous Sulfate (IRON PO) Take 1 tablet by mouth daily. (Patient not taking: Reported on 07/04/2023)     lidocaine  (LIDODERM ) 5 % USE 1 PATCH EXTERNALLY ONCE DAILY (REMOVE  &  DISCARD  PATCH  WITHIN  12  HOURS  OR  AS  DIRECTED  BY  MD) (Patient not taking: Reported on 07/04/2023) 30 patch 0   loratadine  (CLARITIN ) 10 MG tablet Take 1 tablet (10 mg total) by mouth daily. (Patient not taking: Reported on 07/04/2023) 30 tablet 11   metFORMIN  (GLUCOPHAGE ) 500 MG tablet Take 1 tablet (500 mg total) by mouth daily with breakfast. (Patient not taking: Reported on 07/04/2023) 180 tablet 3   traZODone  (DESYREL ) 50 MG tablet Take 0.5 tablets (25 mg total) by mouth at bedtime as needed for sleep. (Patient not taking: Reported on 07/04/2023) 20 tablet 3   HYDROcodone -acetaminophen  (NORCO/VICODIN) 5-325 MG tablet Take 1 tablet by mouth every 8 (eight) hours as needed. (Patient not taking: Reported on 07/04/2023) 12 tablet 0   Na Sulfate-K Sulfate-Mg Sulf 17.5-3.13-1.6 GM/177ML SOLN Use as directed; may use generic; goodrx card if insurance will not cover generic (Patient not taking: Reported on 07/04/2023) 354 mL 0   ondansetron  (ZOFRAN ) 4 MG tablet Take 1 tablet (4 mg total) by mouth every 8 (eight) hours as needed for nausea or vomiting. (Patient not taking: Reported on 07/04/2023) 20 tablet 0   predniSONE  (DELTASONE ) 20 MG tablet Take 1 tablet (20 mg total) by mouth daily with breakfast. (Patient not taking: Reported on 07/04/2023) 4 tablet 0   No  facility-administered medications prior to visit.    Allergies  Allergen Reactions   Lisinopril  Cough    ROS Review of Systems  Constitutional:  Negative for appetite change, chills, fatigue and fever.  HENT:  Negative for congestion, postnasal drip, rhinorrhea and sneezing.   Respiratory:  Negative for cough, shortness of breath and wheezing.   Cardiovascular:  Negative for chest pain, palpitations and leg swelling.  Gastrointestinal:  Negative for abdominal pain, constipation, nausea and vomiting.  Genitourinary:  Negative for difficulty urinating, dysuria, flank pain and frequency.  Musculoskeletal:  Positive for arthralgias. Negative for back pain, joint swelling and myalgias.  Skin:  Negative for color change, pallor, rash and wound.  Neurological:  Negative for dizziness, facial asymmetry, weakness, numbness and headaches.  Psychiatric/Behavioral:  Negative for behavioral problems, confusion, self-injury and suicidal ideas.       Objective:    Physical Exam Vitals and nursing note reviewed.  Constitutional:      General: She is not in acute distress.    Appearance: Normal appearance. She is obese. She is not ill-appearing, toxic-appearing or diaphoretic.  HENT:     Mouth/Throat:     Mouth: Mucous membranes are moist.     Pharynx: Oropharynx is clear. No oropharyngeal exudate or posterior oropharyngeal erythema.  Eyes:     General: No scleral icterus.       Right eye: No discharge.        Left eye: No discharge.     Extraocular Movements: Extraocular movements intact.     Conjunctiva/sclera: Conjunctivae normal.  Cardiovascular:     Rate and Rhythm: Normal rate and regular rhythm.     Pulses: Normal pulses.     Heart sounds: Normal heart sounds. No murmur heard.    No friction rub. No gallop.  Pulmonary:     Effort: Pulmonary effort is normal. No respiratory distress.     Breath  sounds: Normal breath sounds. No stridor. No wheezing, rhonchi or rales.  Chest:      Chest wall: No tenderness.  Abdominal:     General: There is no distension.     Palpations: Abdomen is soft.     Tenderness: There is no abdominal tenderness. There is no right CVA tenderness, left CVA tenderness or guarding.  Musculoskeletal:        General: No swelling, tenderness, deformity or signs of injury.     Right lower leg: No edema.     Left lower leg: No edema.     Comments: No redness, swelling, or tenderness noted on examination of bilateral hands today.  Skin:    General: Skin is warm and dry.     Capillary Refill: Capillary refill takes less than 2 seconds.     Coloration: Skin is not jaundiced or pale.     Findings: No bruising, erythema or lesion.  Neurological:     Mental Status: She is alert and oriented to person, place, and time.     Motor: No weakness.     Coordination: Coordination normal.     Gait: Gait normal.  Psychiatric:        Mood and Affect: Mood normal.        Behavior: Behavior normal.        Thought Content: Thought content normal.        Judgment: Judgment normal.     BP (!) 142/82   Pulse 76   Temp (!) 97.4 F (36.3 C)   Wt 292 lb (132.5 kg)   LMP 10/17/2016 (Exact Date)   SpO2 98%   BMI 45.73 kg/m  Wt Readings from Last 3 Encounters:  07/04/23 292 lb (132.5 kg)  04/04/23 289 lb (131.1 kg)  03/14/23 270 lb (122.5 kg)    Lab Results  Component Value Date   TSH 1.750 04/04/2023   Lab Results  Component Value Date   WBC 6.6 03/14/2023   HGB 14.2 03/14/2023   HCT 42.4 03/14/2023   MCV 90.4 03/14/2023   PLT 279 03/14/2023   Lab Results  Component Value Date   NA 140 04/04/2023   K 4.3 04/04/2023   CO2 23 04/04/2023   GLUCOSE 100 (H) 04/04/2023   BUN 22 04/04/2023   CREATININE 1.10 (H) 04/04/2023   BILITOT 0.3 04/04/2023   ALKPHOS 89 04/04/2023   AST 18 04/04/2023   ALT 16 04/04/2023   PROT 7.0 04/04/2023   ALBUMIN 4.2 04/04/2023   CALCIUM 9.6 04/04/2023   ANIONGAP 9 03/14/2023   EGFR 60 04/04/2023   Lab Results   Component Value Date   CHOL 170 08/11/2020   Lab Results  Component Value Date   HDL 61 08/11/2020   Lab Results  Component Value Date   LDLCALC 94 08/11/2020   Lab Results  Component Value Date   TRIG 83 08/11/2020   Lab Results  Component Value Date   CHOLHDL 2.8 08/11/2020   Lab Results  Component Value Date   HGBA1C 5.9 (A) 04/04/2023      Assessment & Plan:   Problem List Items Addressed This Visit       Cardiovascular and Mediastinum   Essential hypertension, benign - Primary   Chronic uncontrolled condition Continue hydrochlorothiazide  25 mg daily increase amlodipine  to 7.5 mg daily, encouraged to monitor blood pressure at home keep a log and bring to next visit in 4 weeks  DASH diet and commitment to daily physical activity for a  minimum of 30 minutes discussed and encouraged, as a part of hypertension management. The importance of attaining a healthy weight is also discussed.     07/04/2023   10:58 AM 07/04/2023   10:42 AM 04/04/2023   10:57 AM 04/04/2023   10:50 AM 03/14/2023    2:30 PM 03/14/2023    1:30 PM 03/14/2023   12:00 PM  BP/Weight  Systolic BP 142 147 142 153 153 172 171  Diastolic BP 82 86 95 93 92 89 96  Wt. (Lbs)  292  289     BMI  45.73 kg/m2  45.26 kg/m2              Relevant Medications   amLODipine  (NORVASC ) 2.5 MG tablet   hydrochlorothiazide  (HYDRODIURIL ) 25 MG tablet   amLODipine  (NORVASC ) 5 MG tablet   Other Relevant Orders   Basic Metabolic Panel     Nervous and Auditory   Neuropathy   Gabapentin  increased to 600 mg 3 times daily      Relevant Medications   gabapentin  (NEURONTIN ) 600 MG tablet     Other   Severe obesity (BMI >= 40) (HCC)   Wt Readings from Last 3 Encounters:  07/04/23 292 lb (132.5 kg)  04/04/23 289 lb (131.1 kg)  03/14/23 270 lb (122.5 kg)   Body mass index is 45.73 kg/m.  Patient counseled on low-carb diet Encouraged to engage in regular moderate to vigorous exercises at least 150 minutes  weekly as tolerated      Prediabetes   Lab Results  Component Value Date   HGBA1C 5.9 (A) 04/04/2023  Has metformin  ordered but she is not taking the medication Patient counseled on low-carb diet She was encouraged to lose weight      Anxiety and depression      07/04/2023   10:44 AM 04/04/2023   10:46 AM 11/01/2022   10:03 AM  Depression screen PHQ 2/9  Decreased Interest 3 0 3  Down, Depressed, Hopeless 3 0 0  PHQ - 2 Score 6 0 3  Altered sleeping 0 0 1  Tired, decreased energy 1 3 3   Change in appetite 0 0 1  Feeling bad or failure about yourself  0 0 0  Trouble concentrating 0 0 0  Moving slowly or fidgety/restless 1 1 3   Suicidal thoughts 0 0 0  PHQ-9 Score 8 4 11   Difficult doing work/chores Not difficult at all Not difficult at all Somewhat difficult       07/04/2023   10:47 AM 04/04/2023   10:47 AM 02/08/2022   10:17 AM 03/08/2017    3:36 PM  GAD 7 : Generalized Anxiety Score  Nervous, Anxious, on Edge 1 0 2 2  Control/stop worrying 3 0 2 2  Worry too much - different things 3 0 3 2  Trouble relaxing 2 1 2 3   Restless 3 0 0 3  Easily annoyed or irritable 3 1 3 2   Afraid - awful might happen 2 0 2 3  Total GAD 7 Score 17 2 14 17   Anxiety Difficulty Not difficult at all Not difficult at all  Somewhat difficult  Not interested in medications Interested in counseling List of therapists in this area provided      Bilateral hand pain   Increase gabapentin  to 600 mg 3 times daily Will check x-ray of bilateral hands to screen for arthritis      Relevant Orders   DG Hand Complete Right   DG Hand Complete Left  Meds ordered this encounter  Medications   amLODipine  (NORVASC ) 2.5 MG tablet    Sig: Take 1 tablet (2.5 mg total) by mouth daily.    Dispense:  90 tablet    Refill:  1   gabapentin  (NEURONTIN ) 600 MG tablet    Sig: Take 1 tablet (600 mg total) by mouth 3 (three) times daily.    Dispense:  60 tablet    Refill:  5   hydrochlorothiazide   (HYDRODIURIL ) 25 MG tablet    Sig: Take 1 tablet (25 mg total) by mouth daily.    Dispense:  90 tablet    Refill:  1   amLODipine  (NORVASC ) 5 MG tablet    Sig: Take 1 tablet (5 mg total) by mouth daily.    Dispense:  90 tablet    Refill:  1    Follow-up: Return in about 4 weeks (around 08/01/2023) for HTN.    Daniela Hernan R Valeria Krisko, FNP

## 2023-07-04 NOTE — Assessment & Plan Note (Signed)
Increase gabapentin to 600 mg 3 times daily Will check x-ray of bilateral hands to screen for arthritis

## 2023-07-04 NOTE — Assessment & Plan Note (Signed)
Lab Results  Component Value Date   HGBA1C 5.9 (A) 04/04/2023  Has metformin ordered but she is not taking the medication Patient counseled on low-carb diet She was encouraged to lose weight

## 2023-07-04 NOTE — Assessment & Plan Note (Addendum)
    07/04/2023   10:44 AM 04/04/2023   10:46 AM 11/01/2022   10:03 AM  Depression screen PHQ 2/9  Decreased Interest 3 0 3  Down, Depressed, Hopeless 3 0 0  PHQ - 2 Score 6 0 3  Altered sleeping 0 0 1  Tired, decreased energy 1 3 3   Change in appetite 0 0 1  Feeling bad or failure about yourself  0 0 0  Trouble concentrating 0 0 0  Moving slowly or fidgety/restless 1 1 3   Suicidal thoughts 0 0 0  PHQ-9 Score 8 4 11   Difficult doing work/chores Not difficult at all Not difficult at all Somewhat difficult       07/04/2023   10:47 AM 04/04/2023   10:47 AM 02/08/2022   10:17 AM 03/08/2017    3:36 PM  GAD 7 : Generalized Anxiety Score  Nervous, Anxious, on Edge 1 0 2 2  Control/stop worrying 3 0 2 2  Worry too much - different things 3 0 3 2  Trouble relaxing 2 1 2 3   Restless 3 0 0 3  Easily annoyed or irritable 3 1 3 2   Afraid - awful might happen 2 0 2 3  Total GAD 7 Score 17 2 14 17   Anxiety Difficulty Not difficult at all Not difficult at all  Somewhat difficult  Not interested in medications Interested in counseling List of therapists in this area provided

## 2023-07-04 NOTE — Patient Instructions (Addendum)
Around 3 times per week, check your blood pressure 2 times per day. once in the morning and once in the evening. The readings should be at least one minute apart. Write down these values and bring them to your next nurse visit/appointment.  When you check your BP, make sure you have been doing something calm/relaxing 5 minutes prior to checking. Both feet should be flat on the floor and you should be sitting. Use your left arm and make sure it is in a relaxed position (on a table), and that the cuff is at the approximate level/height of your heart.  Blood pressure goal is less than 140/90    1. Essential hypertension, benign (Primary)  - Basic Metabolic Panel - amLODipine (NORVASC) 2.5 MG tablet; Take 1 tablet (2.5 mg total) by mouth daily.  Dispense: 90 tablet; Refill: 1  2. Prediabetes   3. Neuropathy  - gabapentin (NEURONTIN) 600 MG tablet; Take 1 tablet (600 mg total) by mouth 3 (three) times daily.  Dispense: 60 tablet; Refill: 5  4. Severe obesity (BMI >= 40) (HCC)   5. Bilateral hand pain  - DG Hand Complete Right; Future - DG Hand Complete Left; Future   .Behavioral Health Resources:    What if I or someone I know is in crisis?   If you are thinking about harming yourself or having thoughts of suicide, or if you know someone who is, seek help right away.   Call your doctor or mental health care provider.   Call 911 or go to a hospital emergency room to get immediate help, or ask a friend or family member to help you do these things; IF YOU ARE IN [place], YOU MAY GO TO WALK-IN URGENT CARE 24/7 at [facility] (see below)   Call the [place] National Suicide Prevention Lifeline's toll-free, 24-hour hotline at 1-800-273-TALK 619-012-5433) or TTY: 1-800-799-4 TTY 514-368-7959) to talk to a trained counselor.   If you are in crisis, make sure you are not left alone.    If someone else is in crisis, make sure he or she is not left  alone     24 Hour :    [facility]  [address], [place], [place] [address] [phone] or [phone] WALK-IN URGENT CARE 24/7   [facility] Mobile Crisis: [phone]   [place] National Suicide Hotline: (628)272-1513   [facility] [employer] (Domestic Violence, Rape & Victim Assistance)  731-406-8861   [facility]  [address], [place]  [address]   [phone] or [phone]    [facility] [employer] [facility]: [phone] (8am-4pm) or [phone] (after hours)          [facility], [address] [place], [place]  [phone] Fax: [phone] [email or web address] *Interpreters available *Accepts all insurance and uninsured for Urgent Care needs *Accepts Medicaid and uninsured for outpatient treatment    [facility]   Mon-Fri: 8am-5pm [address], [place], [place] [phone](EXHBZ); [phone](fax) [email or web address]  *Accepts Medicare   [facility] [place], Fri: 8am-4pm [address], [place], [place] [address] [phone] (phone); [phone] (fax) [email or web address]  *Accepts Medicare   [facility] Mon-Fri: 9am-5pm  [address], [place], [place] [phone] (phone); [phone]  [email or web address]  *Accepts Medicaid   [facility] [place], 8:30am-12pm/1pm-2:30pm [address] [place], [place] [phone] (phone); [phone] (fax) [email or web address].[email or web address]  *  Accepts Medicaid, sliding-scale*Bilingual services available   [facility] Mon-Fri, 8am-7pm [address] [place], [place]  [phone](WGNFA); [phone](fax) www.[email or web address]  *Accepts Medicaid *Bilingual services available   [facility] Mon-Fri:  8am-5pm, Saturday by appointment only [address] [place], [place], [place] [phone] (phone); [phone] (fax) [email or web address]    [facility] [address], [place], [place] [phone] [email or web address]  *Free & reduced services for uninsured and underinsured individuals *Bilingual services for Spanish-speaking clients 21 and under   [facility], [address], [place], [place] [phone](IEPPI); [phone](fax) [email or web address]  *Bring your own interpreter at first visit *Accepts Medicare and [facility]   [facility] Mon-Fri: 9am-5:30pm [address], [place], [place] [phone] (phone), [phone] (fax) After hours crisis line: [phone] [email or web address]  *Accepts Medicare and Medicaid   [employer], 8am-6pm [address] [place], [place]  [phone] (phone); [phone] (fax) [email or web address]://[email or web address]  *Subsidized costs available   [facility] Mon-Fri: 8am-4pm [address] [place], [place] [phone](KKXFG); [phone](fax) [email or web address].[email or web address]  *Accepts Medicaid   [facility] Same day access hours: Mon-Fri, 8:30-3pm Crisis hours: Mon-Fri, 8am-5pm [address] [place], [place] [phone]   [facility] Same day access hours: Mon-Fri, 8:30-3pm Crisis hours: Mon-Fri, 8am-8pm [address] [place], [place] [phone] (phone); [phone] (fax) www.[email or web address]  *Accepts Medicaid and Medicare   [facility], [place], Fri: 9am-9pm Tues, Thurs: 9am-6pm [address] [place], [place], [place]  [phone] (phone); [phone] (fax) [email or web address]  *(Accepts Medicare and Medicaid; payment plans available)*Bilingual services available   [facility] [address], [place], [place] [phone] (phone); [phone] (fax) [email or web address]    [facility] [address], [place], [place]  [phone]  [email or web address]  *Bilingual services available   [facility] Mon-Thurs: 8am-8pm [address], [place], [place] [phone] (phone); [phone] (fax) [email or web address]  *Accepts Medicaid*Bilingual services available   [facility] [address] [place], [place] [phone] (phone) [email or web address]  *Accepts Medicaid *Bilingual services available   [facility] Mon-Fri, 9am-4:45pm [address], [place], [place] [phone] (phone); [phone] (fax) [email or web address]  *Accepts Medicare   [facility] Mon-Thurs: 8:30-8pm, Fri: 8:30am-7pm [address], [place], [place] (3rd floor) [phone] (phone); [phone] (fax) [email or web address]  *Accepts Medicaid; income-based reduced rates available   [facility] Mon-Fri: 8am-5pm [address], [place], [place] [address] [phone] (phone); [phone] (fax) [email or web address]  *Accepts Medicaid*Bilingual services available     [facility] [place])  [address] [place] [phone] www.[email or web address]  *Provides direct services to individuals in recovery from mental illness, including support groups, recovery skills classes, and one on one peer support   [employer] on Mental Illness) [place] helpline: 270-694-6062  [facility] [place] helpline: (760)780-3789 https://[email or web address]  *A community hub for information relating to local resources and services for the friends and families of individuals living alongside a mental health condition, as well as the individuals themselves.  Classes and support groups also provided         It is important that you exercise regularly at least 30 minutes 5 times a week as tolerated  Think about what you will eat, plan ahead. Choose " clean, green, fresh or frozen" over canned, processed or packaged foods which are more sugary, salty and fatty. 70 to 75% of food eaten should be vegetables and fruit. Three meals at set times with snacks allowed between meals, but they  must be fruit or vegetables. Aim to eat over a 12 hour period , example 7 am to 7 pm, and STOP after  your last meal of the day. Drink water,generally about 64 ounces per day, no other drink is as healthy. Fruit juice is best enjoyed in a healthy way, by EATING the fruit.  Thanks for choosing [facility] we consider it a privelige to serve you.

## 2023-07-26 ENCOUNTER — Other Ambulatory Visit: Payer: Self-pay | Admitting: Nurse Practitioner

## 2023-07-26 NOTE — Telephone Encounter (Signed)
 Please advise [facility]

## 2023-08-02 ENCOUNTER — Ambulatory Visit: Payer: Self-pay | Admitting: Nurse Practitioner

## 2023-08-02 ENCOUNTER — Other Ambulatory Visit: Payer: Self-pay | Admitting: Nurse Practitioner

## 2023-08-03 NOTE — Telephone Encounter (Signed)
 Please advise [facility]

## 2023-08-04 NOTE — Telephone Encounter (Signed)
 Please advise [facility]

## 2023-08-31 ENCOUNTER — Telehealth: Payer: Self-pay | Admitting: Nurse Practitioner

## 2023-08-31 NOTE — Telephone Encounter (Signed)
 LVM to see if pt wanted an appointment earlier than her scheduled appt for leg pain. Offered Friday, 4/4 at 8:00am with [facility].

## 2023-09-27 ENCOUNTER — Other Ambulatory Visit: Payer: Self-pay | Admitting: Nurse Practitioner

## 2023-09-27 NOTE — Telephone Encounter (Signed)
 Please advise [facility]

## 2023-09-30 IMAGING — DX DG FOOT COMPLETE 3+V*R*
3 series · 3 of 3 positions shown · non-contrast
Comparison: Radiograph dated 07/26/2020.

CLINICAL DATA: Follow-up fracture.

EXAM:
RIGHT FOOT COMPLETE - 3+ VIEW

[foot ap]
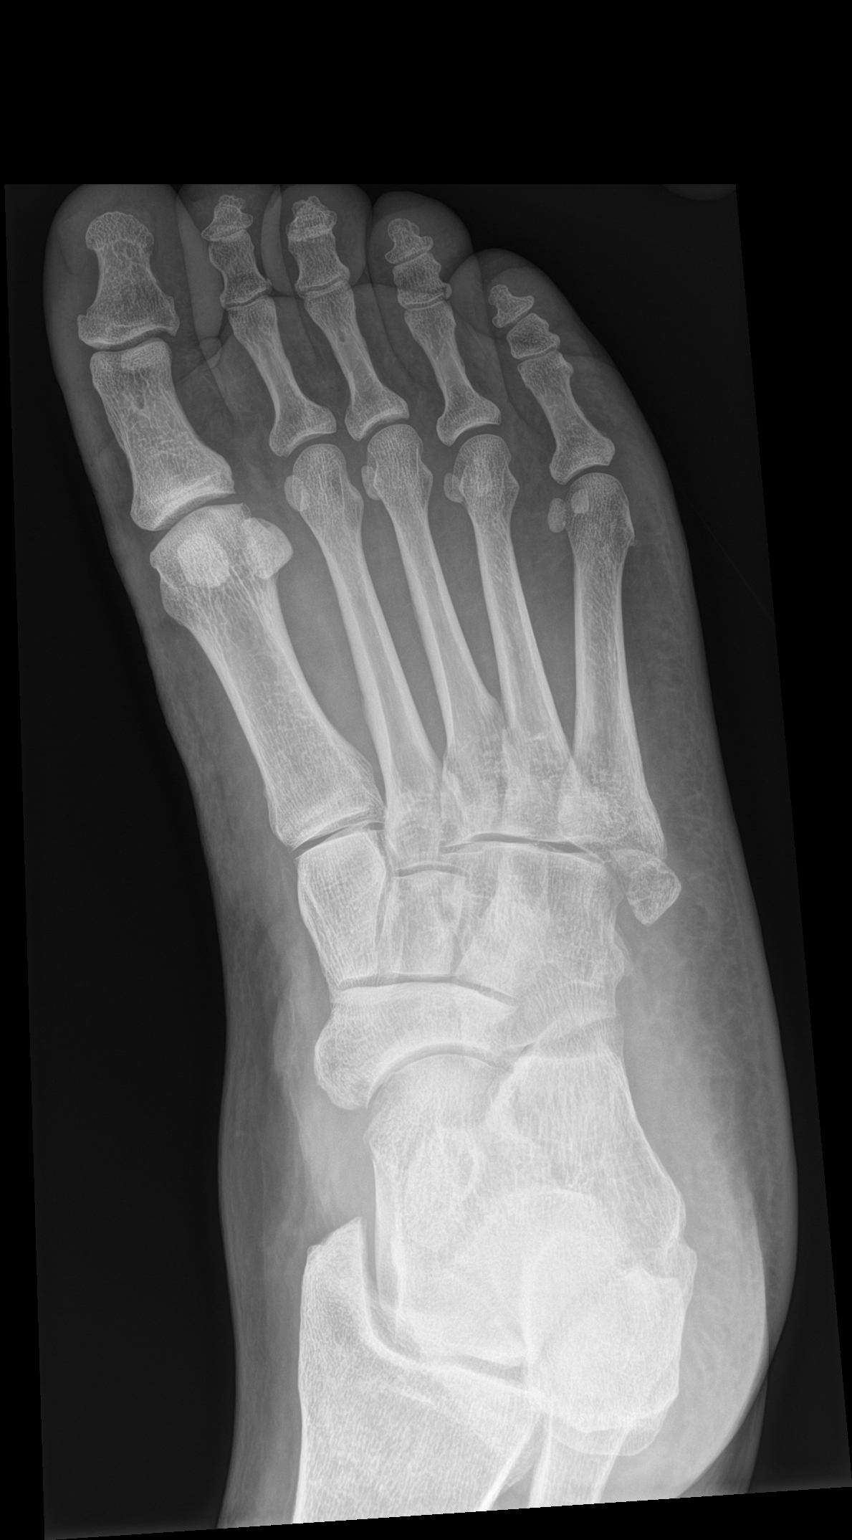

[foot obl]
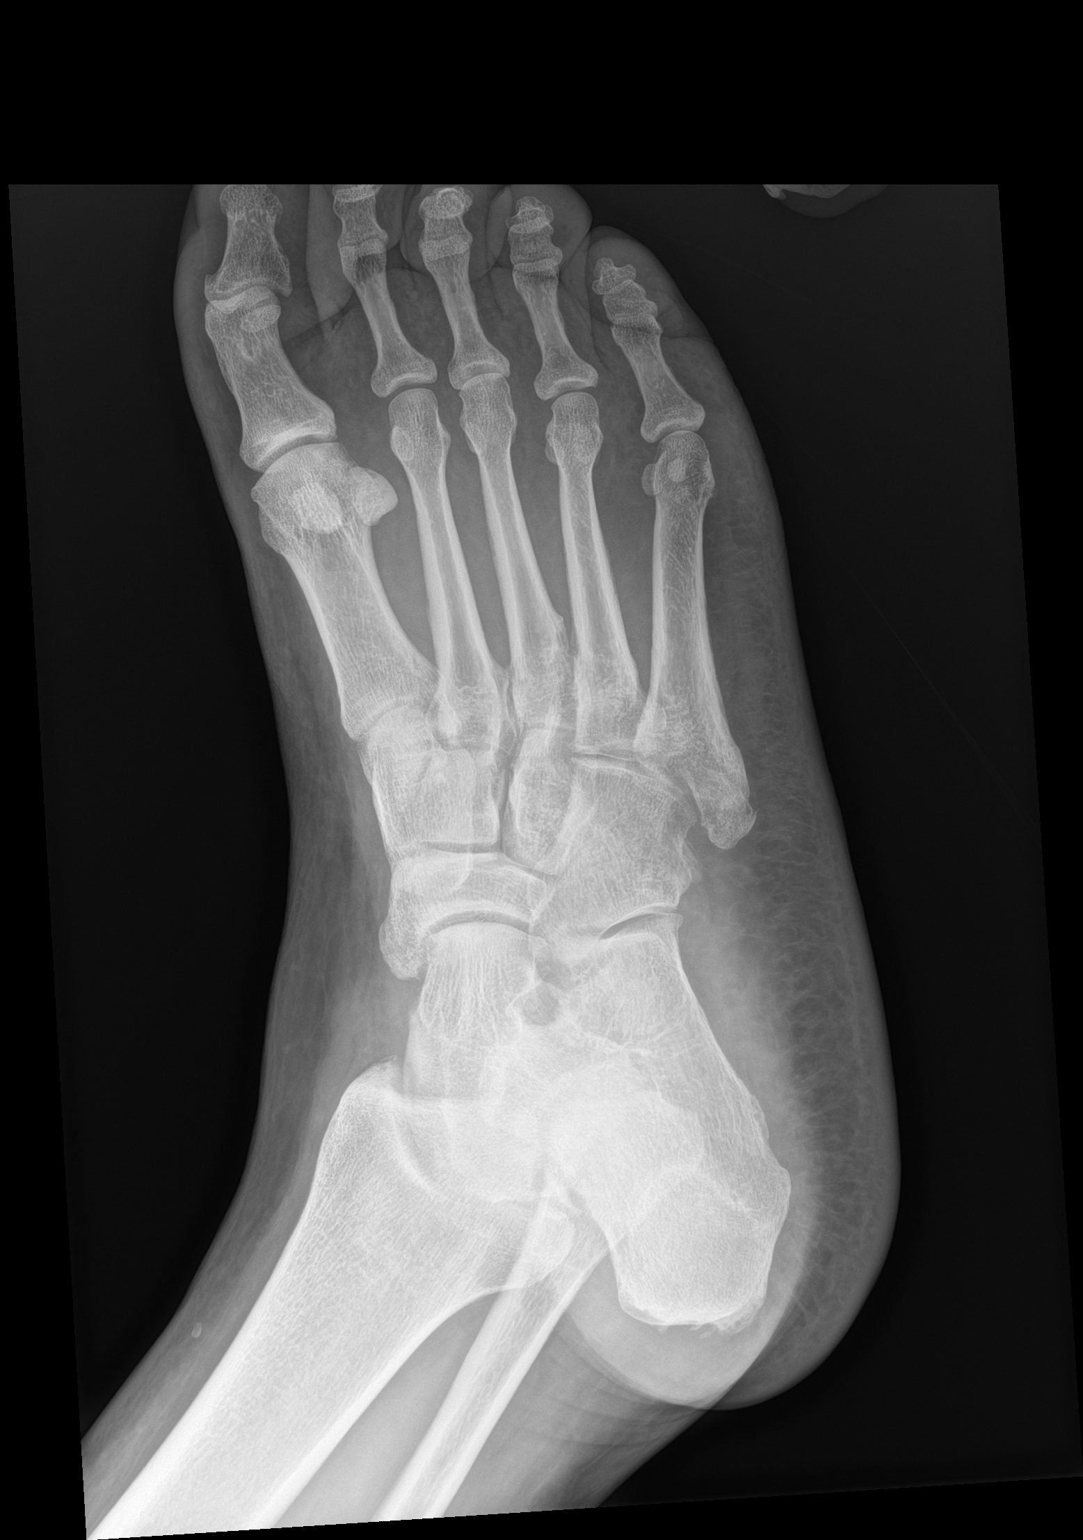

[foot lat]
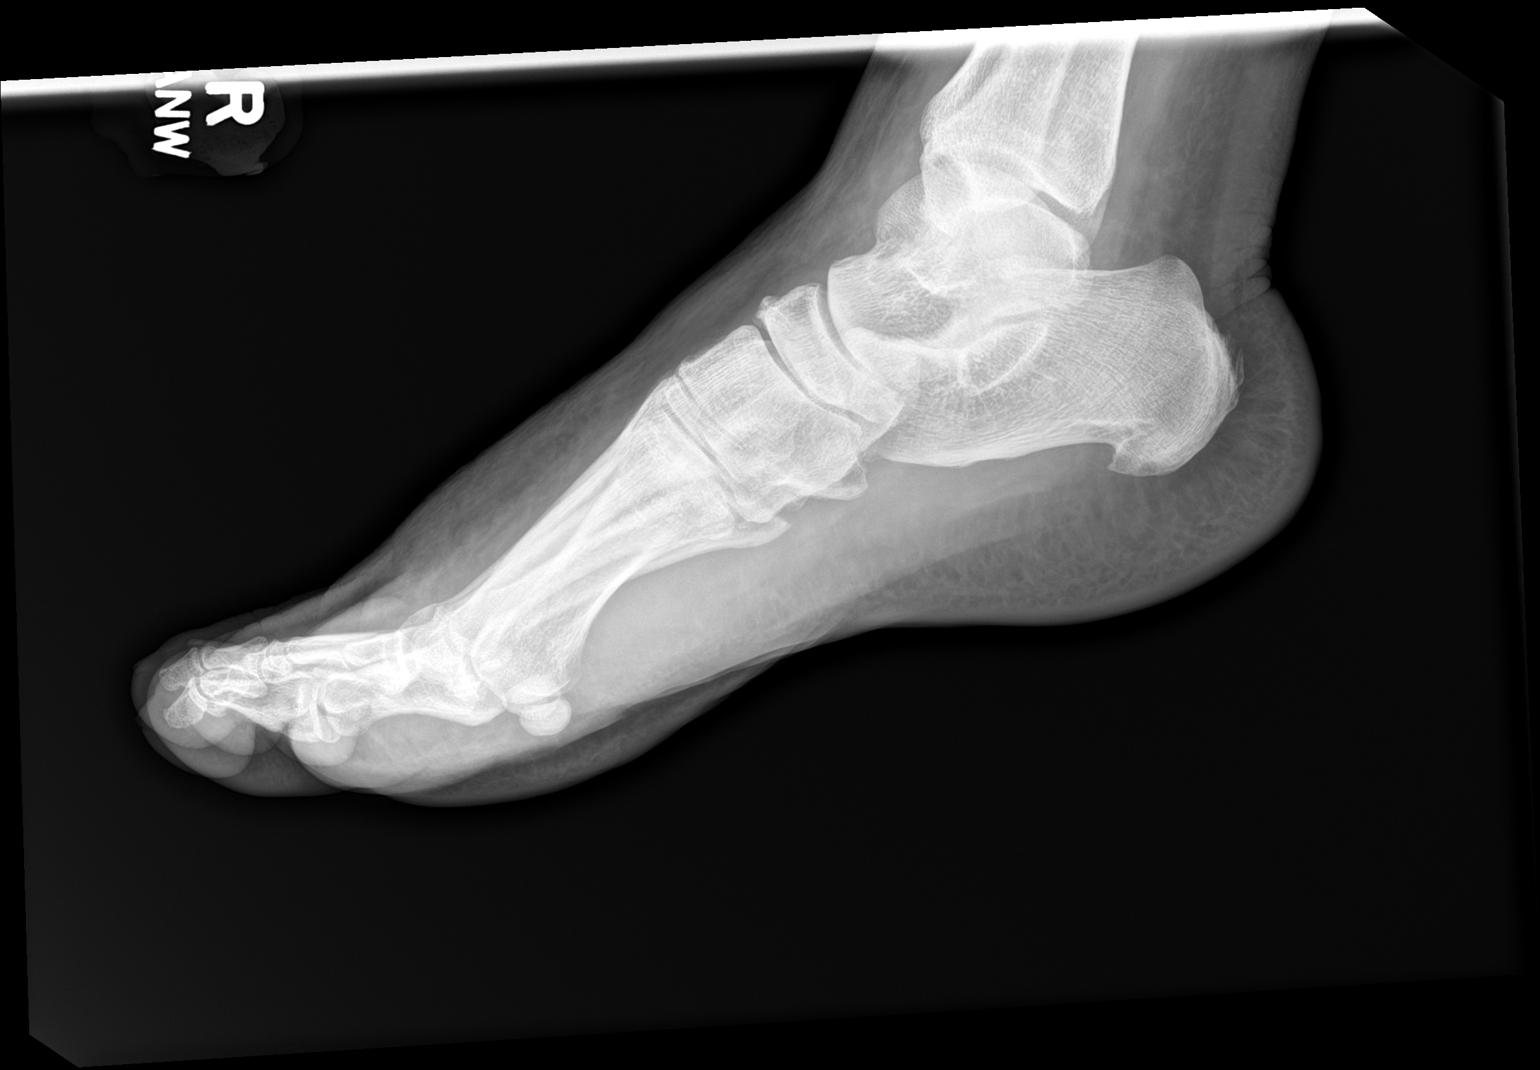

[3 of 3 positions shown; findings below may reference images not displayed]

FINDINGS: Fracture of the base of the fifth metatarsal with nonunion. No new
fracture. No dislocation. The bones are mildly osteopenic. Mild
diffuse subcutaneous edema.
IMPRESSION: Fracture of the base of the fifth metatarsal with nonunion.

## 2023-10-05 ENCOUNTER — Other Ambulatory Visit: Payer: Self-pay | Admitting: Nurse Practitioner

## 2023-10-10 ENCOUNTER — Encounter: Payer: Self-pay | Admitting: Nurse Practitioner

## 2023-10-10 ENCOUNTER — Ambulatory Visit (INDEPENDENT_AMBULATORY_CARE_PROVIDER_SITE_OTHER): Payer: Self-pay | Admitting: Nurse Practitioner

## 2023-10-10 VITALS — BP 135/76 | HR 80 | Temp 97.0°F | Wt 293.0 lb

## 2023-10-10 DIAGNOSIS — G8929 Other chronic pain: Secondary | ICD-10-CM | POA: Insufficient documentation

## 2023-10-10 DIAGNOSIS — M5441 Lumbago with sciatica, right side: Secondary | ICD-10-CM

## 2023-10-10 DIAGNOSIS — I10 Essential (primary) hypertension: Secondary | ICD-10-CM

## 2023-10-10 DIAGNOSIS — J309 Allergic rhinitis, unspecified: Secondary | ICD-10-CM | POA: Insufficient documentation

## 2023-10-10 DIAGNOSIS — M79661 Pain in right lower leg: Secondary | ICD-10-CM | POA: Insufficient documentation

## 2023-10-10 DIAGNOSIS — R7303 Prediabetes: Secondary | ICD-10-CM

## 2023-10-10 DIAGNOSIS — J3089 Other allergic rhinitis: Secondary | ICD-10-CM

## 2023-10-10 MED ORDER — KETOROLAC TROMETHAMINE 30 MG/ML IJ SOLN
30.0000 mg | Freq: Once | INTRAMUSCULAR | Status: AC
Start: 2023-10-10 — End: 2023-10-10
  Administered 2023-10-10: 30 mg via INTRAMUSCULAR

## 2023-10-10 MED ORDER — SALINE SPRAY 0.65 % NA SOLN: 1.0000 | NASAL | 0 refills | Status: DC | PRN

## 2023-10-10 MED ORDER — AMLODIPINE BESYLATE 5 MG PO TABS: 5.0000 mg | ORAL_TABLET | Freq: Every day | ORAL | 1 refills | Status: DC

## 2023-10-10 MED ORDER — METHYLPREDNISOLONE NA SUC (PF) 40 MG IJ SOLR
40.0000 mg | Freq: Once | INTRAMUSCULAR | Status: AC
  Administered 2023-10-10: 40 mg via INTRAMUSCULAR

## 2023-10-10 MED ORDER — PREDNISONE 10 MG (21) PO TBPK: ORAL_TABLET | ORAL | 0 refills | Status: DC

## 2023-10-10 MED ORDER — HYDROCHLOROTHIAZIDE 25 MG PO TABS: 25.0000 mg | ORAL_TABLET | Freq: Every day | ORAL | 1 refills | Status: DC

## 2023-10-10 NOTE — Assessment & Plan Note (Addendum)
 Continue gabapentin  600 mg 3 times daily, lidocaine  5% patch as needed One-time dose of Toradol  30 mg injection and Solu-Medrol  40 mg injection given in the office today Will do a short course of prednisone  starting from tomorrow, encouraged to take medication with food and avoid taking ibuprofen , meloxicam  while taking the medication   09/19/2023 10:17 AM EDT  CLINICAL DATA:  Right lower back pain for 2 years. Lumbago with right-sided sciatica  EXAM: LUMBAR SPINE - 2-3 VIEW  COMPARISON:  Lumbar radiograph 07/28/2014  FINDINGS: Five non-rib-bearing lumbar vertebra. Trace anterolisthesis of L4 on L5. Progressive degenerative disc disease at L4-L5 and L5-S1, moderate in degree. Lower lumbar facet hypertrophy is at least moderate in degree. No evidence of fracture or compression deformity. No visible pars defects. Mild degenerative change of both sacroiliac joints.

## 2023-10-10 NOTE — Patient Instructions (Addendum)
 You were given toradol  30mg  injection and solumedrol 40 mg injection today  Please take prednisone  as instructed on the packaging starting from tomorrow.  Do not take Aleve  or ibuprofen  while taking prednisone .  Take the medication with food   1. Non-seasonal allergic rhinitis due to other allergic trigger (Primary)  - sodium chloride  (OCEAN) 0.65 % SOLN nasal spray; Place 1 spray into both nostrils as needed.  Dispense: 30 mL; Refill: 0  2. Prediabetes   3. Essential hypertension, benign  - CMP14+EGFR - amLODipine  (NORVASC ) 5 MG tablet; Take 1 tablet (5 mg total) by mouth daily.  Dispense: 90 tablet; Refill: 1 - hydrochlorothiazide  (HYDRODIURIL ) 25 MG tablet; Take 1 tablet (25 mg total) by mouth daily.  Dispense: 90 tablet; Refill: 1  4. Chronic right-sided low back pain with right-sided sciatica  - predniSONE  (STERAPRED UNI-PAK 21 TAB) 10 MG (21) TBPK tablet; Take as instructed on the packaging, medication with food  Dispense: 1 each; Refill: 0  5. Pain of right lower leg  - predniSONE  (STERAPRED UNI-PAK 21 TAB) 10 MG (21) TBPK tablet; Take as instructed on the packaging, medication with food  Dispense: 1 each; Refill: 0    It is important that you exercise regularly at least 30 minutes 5 times a week as tolerated  Think about what you will eat, plan ahead. Choose " clean, green, fresh or frozen" over canned, processed or packaged foods which are more sugary, salty and fatty. 70 to 75% of food eaten should be vegetables and fruit. Three meals at set times with snacks allowed between meals, but they must be fruit or vegetables. Aim to eat over a 12 hour period , example 7 am to 7 pm, and STOP after  your last meal of the day. Drink water ,generally about 64 ounces per day, no other drink is as healthy. Fruit juice is best enjoyed in a healthy way, by EATING the fruit.  Thanks for choosing [facility] we consider it a privelige to serve you.

## 2023-10-10 NOTE — Progress Notes (Signed)
 Acute Office Visit  Subjective:     Patient ID: Abigail Beasley, female    DOB: 1970/10/24, 53 y.o.   MRN: [id number]  Chief Complaint  Patient presents with   Leg Pain    Right one scale 8  For about a few days. No injury   Flank Pain    Few days ago.     HPI Ms. 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   Chronic low back pain /Chronic right leg pain patient presents with complaints of worsening right leg pain and low back pain.  Has had this pain for over 2 years, takes gabapentin  600 mg 3 times daily, meloxicam  15 mg daily as needed and uses lidocaine  patch as needed.  Her pain is currently rated 7/10, described it as an aching pain that sometimes makes it difficult for her to ambulate.  No fever, chills, numbness, tingling.  Had an x-ray done recently at the urgent care that was normal for the right lower leg, x-ray of the lumbar spine showed degenerative changes no fracture or compression.  At the urgent care she was referred for physical therapy, and encouraged to take Tylenol /Motrin   Upper respiratory infection she was seen at the urgent care on 09/19/2023 for acute upper respiratory infection was prescribed Zyrtec, Flonase ,promethazine  DM.  Still taking Zyrtec 10 mg daily and Flonase nasal spray.     Review of Systems  Constitutional:  Negative for appetite change, chills, fatigue and fever.  HENT:  Negative for congestion, postnasal drip, rhinorrhea and sneezing.   Respiratory:  Negative for cough, shortness of breath and wheezing.   Cardiovascular:  Negative for chest pain, palpitations and leg swelling.  Gastrointestinal:  Negative for abdominal pain, constipation, nausea and vomiting.  Genitourinary:  Negative for difficulty urinating, dysuria, flank pain and frequency.  Musculoskeletal:   Positive for arthralgias and back pain. Negative for joint swelling and myalgias.  Skin:  Negative for color change, pallor, rash and wound.  Neurological:  Negative for dizziness, facial asymmetry, weakness, numbness and headaches.  Psychiatric/Behavioral:  Negative for behavioral problems, confusion, self-injury and suicidal ideas.         Objective:    BP 135/76   Pulse 80   Temp (!) 97 F (36.1 C)   Wt 293 lb (132.9 kg)   LMP 10/17/2016 (Exact Date)   SpO2 96%   BMI 45.89 kg/m    Physical Exam Vitals and nursing note reviewed.  Constitutional:      General: She is not in acute distress.    Appearance: Normal appearance. She is obese. She is not ill-appearing, toxic-appearing or diaphoretic.  HENT:     Nose: Congestion present. No rhinorrhea.     Right Turbinates: Enlarged.     Left Turbinates: Enlarged.     Mouth/Throat:     Mouth: Mucous membranes are moist.     Pharynx: Oropharynx is clear. No oropharyngeal exudate or posterior oropharyngeal erythema.  Eyes:     General: No scleral icterus.       Right eye: No discharge.        Left eye: No discharge.     Extraocular Movements: Extraocular movements intact.     Conjunctiva/sclera: Conjunctivae normal.  Cardiovascular:     Rate and Rhythm: Normal rate and regular rhythm.     Pulses: Normal pulses.  Heart sounds: Normal heart sounds. No murmur heard.    No friction rub. No gallop.  Pulmonary:     Effort: Pulmonary effort is normal. No respiratory distress.     Breath sounds: Normal breath sounds. No stridor. No wheezing, rhonchi or rales.  Chest:     Chest wall: No tenderness.  Abdominal:     General: There is no distension.     Palpations: Abdomen is soft.     Tenderness: There is no abdominal tenderness. There is no right CVA tenderness, left CVA tenderness or guarding.  Musculoskeletal:        General: No swelling, tenderness, deformity or signs of injury.     Right lower leg: No edema.     Left lower  leg: No edema.  Skin:    General: Skin is warm and dry.     Capillary Refill: Capillary refill takes less than 2 seconds.     Coloration: Skin is not jaundiced or pale.     Findings: No bruising, erythema or lesion.  Neurological:     Mental Status: She is alert and oriented to person, place, and time.     Motor: No weakness.     Coordination: Coordination normal.     Gait: Gait normal.  Psychiatric:        Mood and Affect: Mood normal.        Behavior: Behavior normal.        Thought Content: Thought content normal.        Judgment: Judgment normal.     No results found for any visits on 10/10/23.      Assessment & Plan:   Problem List Items Addressed This Visit       Cardiovascular and Mediastinum   Essential hypertension, benign   On amlodipine  5 mg daily and hydrochlorothiazide  25 mg daily.  Never picked up the prescription for amlodipine  2.5 mg tablet, since blood pressure is within the normal range we will continue amlodipine  5 mg daily and hydrochlorothiazide  25 mg daily. DASH diet and commitment to daily physical activity for a minimum of 30 minutes discussed and encouraged, as a part of hypertension management. The importance of attaining a healthy weight is also discussed.     10/10/2023   10:53 AM 10/10/2023   10:42 AM 07/04/2023   10:58 AM 07/04/2023   10:42 AM 04/04/2023   10:57 AM 04/04/2023   10:50 AM 03/14/2023    2:30 PM  BP/Weight  Systolic BP 135 145 142 147 142 153 153  Diastolic BP 76 81 82 86 95 93 92  Wt. (Lbs)  293  292  289   BMI  45.89 kg/m2  45.73 kg/m2  45.26 kg/m2            Relevant Medications   amLODipine  (NORVASC ) 5 MG tablet   hydrochlorothiazide  (HYDRODIURIL ) 25 MG tablet   Other Relevant Orders   CMP14+EGFR     Respiratory   Allergic rhinitis due to allergen - Primary   Continue Zyrtec 10 mg daily, Flonase nasal spray 2 spray into both nostrils daily Saline nasal spray as needed ordered Avoid allergens      Relevant  Medications   sodium chloride  (OCEAN) 0.65 % SOLN nasal spray     Nervous and Auditory   Chronic right-sided low back pain with right-sided sciatica   Continue gabapentin  600 mg 3 times daily, lidocaine  5% patch as needed One-time dose of Toradol  30 mg injection and Solu-Medrol  40 mg injection given in  the office today Will do a short course of prednisone  starting from tomorrow, encouraged to take medication with food and avoid taking ibuprofen , meloxicam  while taking the medication   09/19/2023 10:17 AM EDT  CLINICAL DATA:  Right lower back pain for 2 years. Lumbago with right-sided sciatica  EXAM: LUMBAR SPINE - 2-3 VIEW  COMPARISON:  Lumbar radiograph 07/28/2014  FINDINGS: Five non-rib-bearing lumbar vertebra. Trace anterolisthesis of L4 on L5. Progressive degenerative disc disease at L4-L5 and L5-S1, moderate in degree. Lower lumbar facet hypertrophy is at least moderate in degree. No evidence of fracture or compression deformity. No visible pars defects. Mild degenerative change of both sacroiliac joints.          Relevant Medications   predniSONE  (STERAPRED UNI-PAK 21 TAB) 10 MG (21) TBPK tablet     Other   Prediabetes   Lab Results  Component Value Date   HGBA1C 5.9 (A) 04/04/2023  Patient counseled on low-carb diet Encouraged to lose weight      Pain of right lower leg   Continue gabapentin  600 mg 3 times daily, lidocaine  5% patch as needed One-time dose of Toradol  30 mg injection and Solu-Medrol  40 mg injection given in the office today Will do a short course of prednisone  starting from tomorrow, encouraged to take medication with food and avoid taking ibuprofen , meloxicam  while taking the medication   09/19/2023 10:16 AM EDT  CLINICAL DATA:  Right leg pain for 2 years.  EXAM: RIGHT TIBIA AND FIBULA - 2 VIEW  COMPARISON:  None Available.  FINDINGS: There is no evidence of fracture or other focal bone lesions. No erosions or periostitis. Knee and  ankle alignment are maintained. Soft tissues are unremarkable.        Relevant Medications   predniSONE  (STERAPRED UNI-PAK 21 TAB) 10 MG (21) TBPK tablet    Meds ordered this encounter  Medications   sodium chloride  (OCEAN) 0.65 % SOLN nasal spray    Sig: Place 1 spray into both nostrils as needed.    Dispense:  30 mL    Refill:  0   predniSONE  (STERAPRED UNI-PAK 21 TAB) 10 MG (21) TBPK tablet    Sig: Take as instructed on the packaging, medication with food    Dispense:  1 each    Refill:  0   amLODipine  (NORVASC ) 5 MG tablet    Sig: Take 1 tablet (5 mg total) by mouth daily.    Dispense:  90 tablet    Refill:  1   hydrochlorothiazide  (HYDRODIURIL ) 25 MG tablet    Sig: Take 1 tablet (25 mg total) by mouth daily.    Dispense:  90 tablet    Refill:  1   ketorolac  (TORADOL ) 30 MG/ML injection 30 mg   methylPREDNISolone  sodium succinate (SOLU-MEDROL ) 40 MG injection 40 mg    Return in about 4 months (around 02/10/2024) for HTN.  Garcia Dalzell R Kamaya Keckler, FNP

## 2023-10-10 NOTE — Assessment & Plan Note (Signed)
 Continue gabapentin  600 mg 3 times daily, lidocaine  5% patch as needed One-time dose of Toradol  30 mg injection and Solu-Medrol  40 mg injection given in the office today Will do a short course of prednisone  starting from tomorrow, encouraged to take medication with food and avoid taking ibuprofen , meloxicam  while taking the medication   09/19/2023 10:16 AM EDT  CLINICAL DATA:  Right leg pain for 2 years.  EXAM: RIGHT TIBIA AND FIBULA - 2 VIEW  COMPARISON:  None Available.  FINDINGS: There is no evidence of fracture or other focal bone lesions. No erosions or periostitis. Knee and ankle alignment are maintained. Soft tissues are unremarkable.

## 2023-10-10 NOTE — Assessment & Plan Note (Signed)
 On amlodipine  5 mg daily and hydrochlorothiazide  25 mg daily.  Never picked up the prescription for amlodipine  2.5 mg tablet, since blood pressure is within the normal range we will continue amlodipine  5 mg daily and hydrochlorothiazide  25 mg daily. DASH diet and commitment to daily physical activity for a minimum of 30 minutes discussed and encouraged, as a part of hypertension management. The importance of attaining a healthy weight is also discussed.     10/10/2023   10:53 AM 10/10/2023   10:42 AM 07/04/2023   10:58 AM 07/04/2023   10:42 AM 04/04/2023   10:57 AM 04/04/2023   10:50 AM 03/14/2023    2:30 PM  BP/Weight  Systolic BP 135 145 142 147 142 153 153  Diastolic BP 76 81 82 86 95 93 92  Wt. (Lbs)  293  292  289   BMI  45.89 kg/m2  45.73 kg/m2  45.26 kg/m2

## 2023-10-10 NOTE — Assessment & Plan Note (Addendum)
 Lab Results  Component Value Date   HGBA1C 5.9 (A) 04/04/2023  Patient counseled on low-carb diet Encouraged to lose weight

## 2023-10-10 NOTE — Assessment & Plan Note (Signed)
 Continue Zyrtec 10 mg daily, Flonase nasal spray 2 spray into both nostrils daily Saline nasal spray as needed ordered Avoid allergens

## 2023-10-11 ENCOUNTER — Ambulatory Visit: Payer: Self-pay | Admitting: Nurse Practitioner

## 2023-10-11 ENCOUNTER — Other Ambulatory Visit: Payer: Self-pay | Admitting: Nurse Practitioner

## 2023-10-11 DIAGNOSIS — G629 Polyneuropathy, unspecified: Secondary | ICD-10-CM

## 2023-10-11 LAB — CMP14+EGFR
ALT: 13 IU/L (ref 0–32)
AST: 13 IU/L (ref 0–40)
Albumin: 4.1 g/dL (ref 3.8–4.9)
Alkaline Phosphatase: 95 IU/L (ref 44–121)
BUN/Creatinine Ratio: 18 (ref 9–23)
BUN: 23 mg/dL (ref 6–24)
Bilirubin Total: <0.2 mg/dL (ref 0.0–1.2)
CO2: 20 mmol/L (ref 20–29)
Calcium: 9.5 mg/dL (ref 8.7–10.2)
Chloride: 106 mmol/L (ref 96–106)
Creatinine, Ser: 1.29 mg/dL — ABNORMAL HIGH (ref 0.57–1.00)
Globulin, Total: 2.7 g/dL (ref 1.5–4.5)
Glucose: 89 mg/dL (ref 70–99)
Potassium: 4 mmol/L (ref 3.5–5.2)
Sodium: 142 mmol/L (ref 134–144)
Total Protein: 6.8 g/dL (ref 6.0–8.5)
eGFR: 49 mL/min/{1.73_m2} — ABNORMAL LOW (ref >59)

## 2023-10-11 MED ORDER — GABAPENTIN 600 MG PO TABS: 600.0000 mg | ORAL_TABLET | Freq: Three times a day (TID) | ORAL | 5 refills | Status: DC

## 2023-10-30 ENCOUNTER — Other Ambulatory Visit: Payer: Self-pay | Admitting: Nurse Practitioner

## 2023-10-30 DIAGNOSIS — M79605 Pain in left leg: Secondary | ICD-10-CM

## 2023-10-30 DIAGNOSIS — M79661 Pain in right lower leg: Secondary | ICD-10-CM

## 2023-10-30 DIAGNOSIS — M79641 Pain in right hand: Secondary | ICD-10-CM

## 2023-10-30 MED ORDER — MELOXICAM 7.5 MG PO TABS
7.5000 mg | ORAL_TABLET | Freq: Every day | ORAL | 0 refills | Status: DC | PRN
Start: 2023-10-30 — End: 2023-11-27

## 2023-11-17 ENCOUNTER — Ambulatory Visit (INDEPENDENT_AMBULATORY_CARE_PROVIDER_SITE_OTHER): Payer: Self-pay | Admitting: Nurse Practitioner

## 2023-11-17 ENCOUNTER — Encounter: Payer: Self-pay | Admitting: Nurse Practitioner

## 2023-11-17 VITALS — BP 162/89 | HR 73 | Temp 97.0°F | Wt 294.0 lb

## 2023-11-17 DIAGNOSIS — M79661 Pain in right lower leg: Secondary | ICD-10-CM

## 2023-11-17 DIAGNOSIS — M79601 Pain in right arm: Secondary | ICD-10-CM | POA: Insufficient documentation

## 2023-11-17 DIAGNOSIS — I10 Essential (primary) hypertension: Secondary | ICD-10-CM

## 2023-11-17 DIAGNOSIS — M79641 Pain in right hand: Secondary | ICD-10-CM | POA: Insufficient documentation

## 2023-11-17 MED ORDER — PREDNISONE 10 MG (21) PO TBPK
ORAL_TABLET | ORAL | 0 refills | Status: DC
Start: 2023-11-18 — End: 2024-02-12

## 2023-11-17 MED ORDER — AMLODIPINE BESYLATE 5 MG PO TABS: 7.5000 mg | ORAL_TABLET | Freq: Every day | ORAL | 1 refills | Status: DC

## 2023-11-17 MED ORDER — KETOROLAC TROMETHAMINE 30 MG/ML IJ SOLN
30.0000 mg | Freq: Once | INTRAMUSCULAR | Status: AC
  Administered 2023-11-17: 30 mg via INTRAMUSCULAR

## 2023-11-17 NOTE — Progress Notes (Signed)
 Acute Office Visit  Subjective:     Patient ID: Abigail Beasley, female    DOB: [birth year], 53 y.o.   MRN: [id number]  Chief Complaint  Patient presents with   Arm Pain   Hand Pain    Worse in the last few day.     Arm Pain  Pertinent negatives include no chest pain or numbness.  Hand Pain  Pertinent negatives include no chest pain or numbness.   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   Patient is in today for complaints of right hand pain and swelling that started  some days ago.  Stated that she never had this pain before, she denies trauma is currently rated 8/10.  No fever, chills, numbness, tingling.  Taking meloxicam  as needed.  She was prescribed prednisone  about a month ago for her right leg pain but never picked up the prescription.  Review of Systems  Constitutional:  Negative for appetite change, chills, fatigue and fever.  HENT:  Negative for congestion, postnasal drip, rhinorrhea and sneezing.   Respiratory:  Negative for cough, shortness of breath and wheezing.   Cardiovascular:  Negative for chest pain, palpitations and leg swelling.  Gastrointestinal:  Negative for abdominal pain, constipation, nausea and vomiting.  Genitourinary:  Negative for difficulty urinating, dysuria, flank pain and frequency.  Musculoskeletal:  Positive for joint swelling. Negative for myalgias.  Skin:  Negative for color change, pallor, rash and wound.  Neurological:  Negative for dizziness, facial asymmetry, weakness, numbness and headaches.  Psychiatric/Behavioral:  Negative for behavioral problems, confusion, self-injury and suicidal ideas.         Objective:    BP (!) 162/89   Pulse 73   Temp (!) 97 F (36.1 C)   Wt 294 lb (133.4 kg)   LMP 10/17/2016 (Exact Date)   SpO2 98%   BMI 46.05 kg/m     Physical Exam Vitals and nursing note reviewed.  Constitutional:      General: She is not in acute distress.    Appearance: Normal appearance. She is obese. She is not ill-appearing, toxic-appearing or diaphoretic.  HENT:     Mouth/Throat:     Mouth: Mucous membranes are moist.     Pharynx: Oropharynx is clear. No oropharyngeal exudate or posterior oropharyngeal erythema.   Eyes:     General: No scleral icterus.       Right eye: No discharge.        Left eye: No discharge.     Extraocular Movements: Extraocular movements intact.     Conjunctiva/sclera: Conjunctivae normal.    Cardiovascular:     Rate and Rhythm: Normal rate and regular rhythm.     Pulses: Normal pulses.     Heart sounds: Normal heart sounds. No murmur heard.    No friction rub. No gallop.  Pulmonary:     Effort: Pulmonary effort is normal. No respiratory distress.     Breath sounds: Normal breath sounds. No stridor. No wheezing, rhonchi or rales.  Chest:     Chest wall: No tenderness.  Abdominal:     General: There is no distension.     Palpations: Abdomen is soft.     Tenderness: There is no abdominal tenderness. There is no right CVA tenderness, left CVA tenderness or guarding.   Musculoskeletal:        General: Swelling  and tenderness present. No deformity or signs of injury.     Right lower leg: No edema.     Left lower leg: No edema.     Comments: Right arm pain on palpation, right hand pain and swelling, skin warm and dry no redness noted has palpable radial pulse   Tenderness on palpation of right leg, no redness or swelling noted   Skin:    General: Skin is warm and dry.     Capillary Refill: Capillary refill takes less than 2 seconds.     Coloration: Skin is not jaundiced or pale.     Findings: No bruising, erythema or lesion.   Neurological:     Mental Status: She is alert and oriented to person, place, and time.     Motor: No weakness.     Coordination: Coordination normal.      Gait: Gait normal.   Psychiatric:        Mood and Affect: Mood normal.        Behavior: Behavior normal.        Thought Content: Thought content normal.        Judgment: Judgment normal.     No results found for any visits on 11/17/23.      Assessment & Plan:   Problem List Items Addressed This Visit       Cardiovascular and Mediastinum   Essential hypertension, benign   Blood pressure is high in the office today , her arm pain could be contributing to this start amlodipine  7.5 mg daily continue hydrochlorothiazide  25 mg daily Monitor blood pressure at home keep a log and bring to next office visit in 4 weeks      11/17/2023    3:10 PM 11/17/2023    2:58 PM 10/10/2023   10:53 AM 10/10/2023   10:42 AM 07/04/2023   10:58 AM 07/04/2023   10:42 AM 04/04/2023   10:57 AM  BP/Weight  Systolic BP 162 148 135 145 142 147 142  Diastolic BP 89 89 76 81 82 86 95  Wt. (Lbs)  294  293  292   BMI  46.05 kg/m2  45.89 kg/m2  45.73 kg/m2            Relevant Medications   amLODipine  (NORVASC ) 5 MG tablet     Other   Pain of right lower leg   Continue gabapentin  600 mg 3 times daily Follow-up with orthopedics      Relevant Orders   Ambulatory referral to Orthopedic Surgery   Right hand pain   Toradol  30 mg one-time dose given in the office today I discussed getting a stat ultrasound to rule out DVT but the patient declined Prednisone  Sterapred 10 mg tablets ordered Advised not to take ibuprofen  or Aleve  when taking prednisone       Relevant Orders   Ambulatory referral to Orthopedic Surgery   Right arm pain - Primary   Toradol  30 mg one-time dose given in the office today I discussed getting a stat ultrasound to rule out DVT but the patient declined Prednisone  Sterapred 10 mg tablets ordered Advised not to take ibuprofen  or Aleve  when taking prednisone       Relevant Medications   amLODipine  (NORVASC ) 5 MG tablet   predniSONE  (STERAPRED UNI-PAK 21 TAB) 10 MG (21) TBPK tablet  (Start on 11/18/2023)   Other Relevant Orders   US  Venous Img Upper Uni Right(DVT)   Ambulatory referral to Orthopedic Surgery    Meds ordered this encounter  Medications  amLODipine  (NORVASC ) 5 MG tablet    Sig: Take 1.5 tablets (7.5 mg total) by mouth daily.    Dispense:  90 tablet    Refill:  1   predniSONE  (STERAPRED UNI-PAK 21 TAB) 10 MG (21) TBPK tablet    Sig: Take as instructed on the packaging, medication with food    Dispense:  1 each    Refill:  0    Return in about 4 weeks (around 12/15/2023) for HTN.  Allin Frix R Mairen Wallenstein, FNP

## 2023-11-17 NOTE — Assessment & Plan Note (Signed)
 Blood pressure is high in the office today , her arm pain could be contributing to this start amlodipine  7.5 mg daily continue hydrochlorothiazide  25 mg daily Monitor blood pressure at home keep a log and bring to next office visit in 4 weeks      11/17/2023    3:10 PM 11/17/2023    2:58 PM 10/10/2023   10:53 AM 10/10/2023   10:42 AM 07/04/2023   10:58 AM 07/04/2023   10:42 AM 04/04/2023   10:57 AM  BP/Weight  Systolic BP 162 148 135 145 142 147 142  Diastolic BP 89 89 76 81 82 86 95  Wt. (Lbs)  294  293  292   BMI  46.05 kg/m2  45.89 kg/m2  45.73 kg/m2

## 2023-11-17 NOTE — Assessment & Plan Note (Signed)
 Toradol  30 mg one-time dose given in the office today I discussed getting a stat ultrasound to rule out DVT but the patient declined Prednisone  Sterapred 10 mg tablets ordered Advised not to take ibuprofen  or Aleve  when taking prednisone 

## 2023-11-17 NOTE — Assessment & Plan Note (Signed)
 Continue gabapentin  600 mg 3 times daily Follow-up with orthopedics

## 2023-11-17 NOTE — Addendum Note (Signed)
 Addended by: Julian Obey on: 11/17/2023 05:00 PM   Modules accepted: Orders

## 2023-11-17 NOTE — Patient Instructions (Addendum)
 [facility] [id number]  Please take prednisone  with food and as instructed on the packaging.  Do not take Aleve  or ibuprofen  while taking prednisone ,   You were given Toradol  30 mg injection.   For blood pressure please start taking amlodipine  7.5 mg daily continue hydrochlorothiazide  25 mg daily  Around 3 times per week, check your blood pressure 2 times per day. once in the morning and once in the evening. The readings should be at least one minute apart. Write down these values and bring them to your next nurse visit/appointment.  When you check your BP, make sure you have been doing something calm/relaxing 5 minutes prior to checking. Both feet should be flat on the floor and you should be sitting. Use your left arm and make sure it is in a relaxed position (on a table), and that the cuff is at the approximate level/height of your heart.  Blood pressure goal is less than 130/80    It is important that you exercise regularly at least 30 minutes 5 times a week as tolerated  Think about what you will eat, plan ahead. Choose  clean, green, fresh or frozen over canned, processed or packaged foods which are more sugary, salty and fatty. 70 to 75% of food eaten should be vegetables and fruit. Three meals at set times with snacks allowed between meals, but they must be fruit or vegetables. Aim to eat over a 12 hour period , example 7 am to 7 pm, and STOP after  your last meal of the day. Drink water ,generally about 64 ounces per day, no other drink is as healthy. Fruit juice is best enjoyed in a healthy way, by EATING the fruit.  Thanks for choosing [facility] we consider it a privelige to serve you.

## 2023-11-23 ENCOUNTER — Ambulatory Visit
Admission: RE | Admit: 2023-11-23 | Discharge: 2023-11-23 | Disposition: A | Discharge status: Home / Self Care | Payer: Self-pay | Source: Ambulatory Visit | Attending: Nurse Practitioner | Admitting: Nurse Practitioner

## 2023-11-23 DIAGNOSIS — M79601 Pain in right arm: Secondary | ICD-10-CM

## 2023-11-24 ENCOUNTER — Ambulatory Visit: Payer: Self-pay | Admitting: Nurse Practitioner

## 2023-11-25 ENCOUNTER — Other Ambulatory Visit: Payer: Self-pay | Admitting: Nurse Practitioner

## 2023-11-25 DIAGNOSIS — M79605 Pain in left leg: Secondary | ICD-10-CM

## 2023-11-25 DIAGNOSIS — M79642 Pain in left hand: Secondary | ICD-10-CM

## 2023-11-25 DIAGNOSIS — M79661 Pain in right lower leg: Secondary | ICD-10-CM

## 2023-11-27 ENCOUNTER — Other Ambulatory Visit: Payer: Self-pay | Admitting: Nurse Practitioner

## 2023-11-27 DIAGNOSIS — M79661 Pain in right lower leg: Secondary | ICD-10-CM

## 2023-11-27 DIAGNOSIS — M79605 Pain in left leg: Secondary | ICD-10-CM

## 2023-11-27 DIAGNOSIS — M79641 Pain in right hand: Secondary | ICD-10-CM

## 2023-11-27 MED ORDER — MELOXICAM 7.5 MG PO TABS
7.5000 mg | ORAL_TABLET | Freq: Every day | ORAL | 0 refills | Status: DC | PRN
Start: 2023-11-27 — End: 2023-12-11

## 2023-12-07 ENCOUNTER — Telehealth: Payer: Self-pay

## 2023-12-07 NOTE — Telephone Encounter (Signed)
 Copied from CRM [id number]. Topic: Clinical - Prescription Issue >> Dec 07, 2023  1:08 PM Larissa S wrote: Reason for CRM: Patient states she only received a quantity of 10 pills of the meloxicam  (MOBIC ) 7.5 MG tablet. She states she normally receives 30 and would like to know why the medication was decreased. Requesting a callback from nurse/provider.  Callback [phone]

## 2023-12-11 ENCOUNTER — Other Ambulatory Visit: Payer: Self-pay | Admitting: Family Medicine

## 2023-12-11 ENCOUNTER — Other Ambulatory Visit: Payer: Self-pay | Admitting: Nurse Practitioner

## 2023-12-11 DIAGNOSIS — M79641 Pain in right hand: Secondary | ICD-10-CM

## 2023-12-11 DIAGNOSIS — M79605 Pain in left leg: Secondary | ICD-10-CM

## 2023-12-11 DIAGNOSIS — M79661 Pain in right lower leg: Secondary | ICD-10-CM

## 2023-12-11 DIAGNOSIS — G629 Polyneuropathy, unspecified: Secondary | ICD-10-CM

## 2023-12-11 NOTE — Telephone Encounter (Signed)
 Please advise [facility]

## 2023-12-25 ENCOUNTER — Other Ambulatory Visit (HOSPITAL_BASED_OUTPATIENT_CLINIC_OR_DEPARTMENT_OTHER): Payer: Self-pay | Admitting: Physician Assistant

## 2023-12-25 ENCOUNTER — Ambulatory Visit (HOSPITAL_BASED_OUTPATIENT_CLINIC_OR_DEPARTMENT_OTHER): Payer: Self-pay

## 2023-12-25 ENCOUNTER — Ambulatory Visit (HOSPITAL_BASED_OUTPATIENT_CLINIC_OR_DEPARTMENT_OTHER): Payer: Self-pay | Admitting: Orthopaedic Surgery

## 2023-12-25 DIAGNOSIS — M79601 Pain in right arm: Secondary | ICD-10-CM

## 2023-12-25 NOTE — Progress Notes (Signed)
 Chief Complaint: Right hand numbness     History of Present Illness:    Abigail Beasley is a 53 y.o. female dominant female presents with right hand numbness in the small and ring finger for the last several months.  This is waking her up at night.  This is worse with positions of flexion about the arm    PMH/PSH/Family History/Social History/Meds/Allergies:    Past Medical History:  Diagnosis Date   Allergy    Anxiety    Elevated serum creatinine 01/2019   History of proteinuria syndrome 01/2019   Hypertension    Left flank pain 01/2019   PONV (postoperative nausea and vomiting)    Past Surgical History:  Procedure Laterality Date   ABDOMINAL HYSTERECTOMY     COLONOSCOPY WITH PROPOFOL  N/A 02/28/2023   Procedure: COLONOSCOPY WITH PROPOFOL ;  Surgeon: Federico Rosario JAYSON, MD;  Location: [facility];  Service: Gastroenterology;  Laterality: N/A;   CYSTOSCOPY N/A 11/08/2016   Procedure: CYSTOSCOPY;  Surgeon: Jannis Kate Norris, MD;  Location: [facility];  Service: Gynecology;  Laterality: N/A;   DILATION AND EVACUATION     miscarriage   TOTAL LAPAROSCOPIC HYSTERECTOMY WITH SALPINGECTOMY Bilateral 11/08/2016   Procedure: HYSTERECTOMY TOTAL LAPAROSCOPIC WITH BILATERAL SALPINGECTOMY;  Surgeon: Jannis Kate Norris, MD;  Location: [facility];  Service: Gynecology;  Laterality: Bilateral;   TUBAL LIGATION     Social History   Socioeconomic History   Marital status: Single    Spouse name: Not on file   Number of children: Not on file   Years of education: Not on file   Highest education level: 10th grade  Occupational History   Not on file  Tobacco Use   Smoking status: Never   Smokeless tobacco: Never  Vaping Use   Vaping status: Never Used  Substance and Sexual Activity   Alcohol use: Yes    Alcohol/week: 3.0 standard drinks of alcohol    Types: 3 Standard drinks or equivalent per week    Comment: occ   Drug use: Not Currently    Types: Marijuana    Comment: occ   Sexual  activity: Not Currently    Partners: Male    Birth control/protection: Surgical  Other Topics Concern   Not on file  Social History Narrative   Marital status: single; not dating.      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      Tobacco: none      Alcohol:  Socially; rarely.      Drugs: none; marijuana in the past.      Exercise: none      History of incarceration for six years (guilt by association; friend robbed bank).      Sexual activity:  Males; last STD Gonorrhea at age 87.  Syphilis in twenties.    Social Drivers of Corporate investment banker Strain: Low Risk  (11/16/2023)   Overall Financial Resource Strain (CARDIA)    Difficulty of Paying Living Expenses: Not very hard  Food Insecurity: Food Insecurity Present (11/16/2023)   Hunger Vital Sign    Worried About Running Out of Food in the Last Year: Sometimes true    Ran Out of Food in the Last Year: Sometimes true  Transportation Needs: No Transportation Needs (11/16/2023)   PRAPARE - Administrator, Civil Service (Medical): No    Lack  of Transportation (Non-Medical): No  Physical Activity: Inactive (11/16/2023)   Exercise Vital Sign    Days of Exercise per Week: 1 day    Minutes of Exercise per Session: 0 min  Stress: Stress Concern Present (11/16/2023)   [employer] of Occupational Health - Occupational Stress Questionnaire    Feeling of Stress: To some extent  Social Connections: Socially Isolated (11/16/2023)   Social Connection and Isolation Panel    Frequency of Communication with Friends and Family: More than three times a week    Frequency of Social Gatherings with Friends and Family: Twice a week    Attends Religious Services: Never    Database administrator or Organizations: No    Attends Engineer, structural: Not on file    Marital Status: Never married   Family History  Problem Relation Age  of Onset   Diabetes Mother    Hypertension Mother    Cancer Mother        liver    Hyperlipidemia Mother    Stroke Mother    Hypertension Sister    Diabetes Sister    Diabetes Maternal Uncle    Colon polyps Paternal Uncle    Colon cancer Paternal Uncle    Diabetes Maternal Grandmother    Hypertension Maternal Grandmother    Hyperlipidemia Maternal Grandmother    Heart disease Maternal Grandmother    Colon polyps Maternal Grandfather    Cancer Maternal Grandfather        prostate   Diabetes Maternal Grandfather    Hyperlipidemia Maternal Grandfather    Hypertension Maternal Grandfather    Colon cancer Maternal Grandfather    Breast cancer Neg Hx    Esophageal cancer Neg Hx    Rectal cancer Neg Hx    Stomach cancer Neg Hx    Allergies  Allergen Reactions   Lisinopril  Cough   Current Outpatient Medications  Medication Sig Dispense Refill   amLODipine  (NORVASC ) 5 MG tablet Take 1.5 tablets (7.5 mg total) by mouth daily. 90 tablet 1   cetirizine (ZYRTEC) 10 MG tablet Take 1 tablet by mouth daily.     dorzolamide-timolol (COSOPT) 2-0.5 % ophthalmic solution 1 drop 2 (two) times daily.     Ferrous Sulfate (IRON PO) Take 1 tablet by mouth daily. (Patient not taking: Reported on 11/17/2023)     fluticasone (FLONASE) 50 MCG/ACT nasal spray Place 1 spray into the nose.     gabapentin  (NEURONTIN ) 600 MG tablet Take 1 tablet by mouth twice daily 60 tablet 0   hydrochlorothiazide  (HYDRODIURIL ) 25 MG tablet Take 1 tablet (25 mg total) by mouth daily. 90 tablet 1   latanoprost (XALATAN) 0.005 % ophthalmic solution 1 drop at bedtime.     lidocaine  (LIDODERM ) 5 % USE 1 PATCH EXTERNALLY ONCE DAILY (REMOVE  &  DISCARD  PATCH  WITHIN  12  HOURS  OR  AS  DIRECTED  BY  MD) (Patient not taking: Reported on 11/17/2023) 30 patch 0   loratadine  (CLARITIN ) 10 MG tablet Take 1 tablet (10 mg total) by mouth daily. (Patient not taking: Reported on 11/17/2023) 30 tablet 11   meloxicam  (MOBIC ) 7.5 MG tablet  TAKE 1 TABLET BY MOUTH ONCE DAILY AS NEEDED FOR PAIN WITH FOOD 10 tablet 0   Multiple Vitamin (MULTIVITAMIN WITH MINERALS) TABS tablet Take 1 tablet by mouth 4 (four) times a week.     predniSONE  (STERAPRED UNI-PAK 21 TAB) 10 MG (21) TBPK tablet Take as instructed on the packaging, medication with  food 1 each 0   sodium chloride  (OCEAN) 0.65 % SOLN nasal spray Place 1 spray into both nostrils as needed. (Patient not taking: Reported on 11/17/2023) 30 mL 0   traZODone  (DESYREL ) 50 MG tablet Take 0.5 tablets (25 mg total) by mouth at bedtime as needed for sleep. (Patient not taking: Reported on 11/17/2023) 20 tablet 3   No current facility-administered medications for this visit.   No results found.  Review of Systems:   A ROS was performed including pertinent positives and negatives as documented in the HPI.  Physical Exam :   Constitutional: NAD and appears stated age Neurological: Alert and oriented Psych: Appropriate affect and cooperative Last menstrual period 10/17/2016.   Comprehensive Musculoskeletal Exam:    Positive Tinel right elbow with numbness in a flexed position.  Otherwise distal neurosensory exam is intact without muscular weakness   Imaging:     I personally reviewed and interpreted the radiographs.   Assessment and Plan:   53 y.o. female with evidence of right elbow cubital tunnel syndrome.  At this time we will plan to refer her to Dr. Eldonna for an EMG nerve conduction test of the right elbow to rule this in or out.  Will see her back following discuss results  -Return to clinic following results   I personally saw and evaluated the patient, and participated in the management and treatment plan.  Elspeth Parker, MD Attending Physician, Orthopedic Surgery  This document was dictated using Dragon voice recognition software. A reasonable attempt at proof reading has been made to minimize errors.

## 2024-01-03 ENCOUNTER — Other Ambulatory Visit: Payer: Self-pay | Admitting: Nurse Practitioner

## 2024-01-03 DIAGNOSIS — M79641 Pain in right hand: Secondary | ICD-10-CM

## 2024-01-03 DIAGNOSIS — M79661 Pain in right lower leg: Secondary | ICD-10-CM

## 2024-01-03 DIAGNOSIS — M79605 Pain in left leg: Secondary | ICD-10-CM

## 2024-01-09 ENCOUNTER — Other Ambulatory Visit: Payer: Self-pay | Admitting: Nurse Practitioner

## 2024-01-09 DIAGNOSIS — G629 Polyneuropathy, unspecified: Secondary | ICD-10-CM

## 2024-01-09 NOTE — Telephone Encounter (Signed)
 Please advise [facility]

## 2024-01-12 ENCOUNTER — Ambulatory Visit (INDEPENDENT_AMBULATORY_CARE_PROVIDER_SITE_OTHER): Payer: Self-pay | Admitting: Physical Medicine and Rehabilitation

## 2024-01-12 DIAGNOSIS — M79601 Pain in right arm: Secondary | ICD-10-CM

## 2024-01-12 DIAGNOSIS — M79641 Pain in right hand: Secondary | ICD-10-CM

## 2024-01-12 DIAGNOSIS — R29898 Other symptoms and signs involving the musculoskeletal system: Secondary | ICD-10-CM

## 2024-01-12 DIAGNOSIS — R202 Paresthesia of skin: Secondary | ICD-10-CM

## 2024-01-12 NOTE — Progress Notes (Signed)
 RUE EMG Right handed  7/10  Numbness and tingling in right harm  Is having weakness

## 2024-01-14 NOTE — Procedures (Signed)
 EMG & NCV Findings: Evaluation of the right median (across palm) sensory nerve showed prolonged distal peak latency (Wrist, 4.1 ms).  All remaining nerves (as indicated in the following tables) were within normal limits.    All examined muscles (as indicated in the following table) showed no evidence of electrical instability.    Impression: The above electrodiagnostic study is ABNORMAL and reveals evidence of a mild right median nerve entrapment at the wrist (carpal tunnel syndrome) affecting sensory components.   There is no significant electrodiagnostic evidence of any other focal nerve entrapment, brachial plexopathy or cervical radiculopathy.  As you know, this particular electrodiagnostic study cannot rule out chemical radiculitis or sensory only radiculopathy.  Recommendations: 1.  Follow-up with referring physician. 2.  Continue current management of symptoms.  Clinical correlation is paramount.  If felt to be cervical radiculopathy consider cervical MRI. 3.  Continue use of resting splint at night-time and as needed during the day.  ___________________________ Prentice Eldonna BETTERS Board Certified, American Board of Physical Medicine and Rehabilitation    Nerve Conduction Studies Anti Sensory Summary Table   Stim Site NR Peak (ms) Norm Peak (ms) P-T Amp (V) Norm P-T Amp Site1 Site2 Delta-P (ms) Dist (cm) Vel (m/s) Norm Vel (m/s)  Right Median Acr Palm Anti Sensory (2nd Digit)  31.4C  Wrist    *4.1 <3.6 33.3 >10 Wrist Palm 2.2 0.0    Palm    1.9 <2.0 2.3         Right Radial Anti Sensory (Base 1st Digit)  32.7C  Wrist    2.0 <3.1 26.2  Wrist Base 1st Digit 2.0 0.0    Right Ulnar Anti Sensory (5th Digit)  32.2C  Wrist    2.8 <3.7 26.2 >15.0 Wrist 5th Digit 2.8 14.0 50 >38   Motor Summary Table   Stim Site NR Onset (ms) Norm Onset (ms) O-P Amp (mV) Norm O-P Amp Site1 Site2 Delta-0 (ms) Dist (cm) Vel (m/s) Norm Vel (m/s)  Right Median Motor (Abd Poll Brev)  32.6C  Wrist     3.8 <4.2 8.8 >5 Elbow Wrist 4.2 23.3 55 >50  Elbow    8.0  8.6         Right Ulnar Motor (Abd Dig Min)  32.4C  Wrist    2.7 <4.2 6.8 >3 B Elbow Wrist 3.4 20.0 59 >53  B Elbow    6.1  6.7  A Elbow B Elbow 1.1 10.0 91 >53  A Elbow    7.2  6.7          EMG   Side Muscle Nerve Root Ins Act Fibs Psw Amp Dur Poly Recrt Int Bruna Comment  Right Abd Poll Brev Median C8-T1 Nml Nml Nml Nml Nml 0 Nml Nml   Right 1stDorInt Ulnar C8-T1 Nml Nml Nml Nml Nml 0 Nml Nml   Right PronatorTeres Median C6-7 Nml Nml Nml Nml Nml 0 Nml Nml   Right Biceps Musculocut C5-6 Nml Nml Nml Nml Nml 0 Nml Nml   Right Deltoid Axillary C5-6 Nml Nml Nml Nml Nml 0 Nml Nml     Nerve Conduction Studies Anti Sensory Left/Right Comparison   Stim Site L Lat (ms) R Lat (ms) L-R Lat (ms) L Amp (V) R Amp (V) L-R Amp (%) Site1 Site2 L Vel (m/s) R Vel (m/s) L-R Vel (m/s)  Median Acr Palm Anti Sensory (2nd Digit)  31.4C  Wrist  *4.1   33.3  Wrist Palm     Palm  1.9  2.3        Radial Anti Sensory (Base 1st Digit)  32.7C  Wrist  2.0   26.2  Wrist Base 1st Digit     Ulnar Anti Sensory (5th Digit)  32.2C  Wrist  2.8   26.2  Wrist 5th Digit  50    Motor Left/Right Comparison   Stim Site L Lat (ms) R Lat (ms) L-R Lat (ms) L Amp (mV) R Amp (mV) L-R Amp (%) Site1 Site2 L Vel (m/s) R Vel (m/s) L-R Vel (m/s)  Median Motor (Abd Poll Brev)  32.6C  Wrist  3.8   8.8  Elbow Wrist  55   Elbow  8.0   8.6        Ulnar Motor (Abd Dig Min)  32.4C  Wrist  2.7   6.8  B Elbow Wrist  59   B Elbow  6.1   6.7  A Elbow B Elbow  91   A Elbow  7.2   6.7           Waveforms:

## 2024-01-17 ENCOUNTER — Ambulatory Visit (HOSPITAL_BASED_OUTPATIENT_CLINIC_OR_DEPARTMENT_OTHER): Payer: Self-pay | Admitting: Orthopaedic Surgery

## 2024-01-17 ENCOUNTER — Other Ambulatory Visit (HOSPITAL_BASED_OUTPATIENT_CLINIC_OR_DEPARTMENT_OTHER): Payer: Self-pay

## 2024-01-17 DIAGNOSIS — G5621 Lesion of ulnar nerve, right upper limb: Secondary | ICD-10-CM

## 2024-01-17 MED ORDER — METHYLPREDNISOLONE 4 MG PO TBPK: ORAL_TABLET | ORAL | 0 refills | Status: DC

## 2024-01-17 NOTE — Progress Notes (Signed)
 Chief Complaint: Right hand numbness     History of Present Illness:    Abigail Beasley is a 53 y.o. female dominant presents today for follow-up of her EMG nerve conduction test.    PMH/PSH/Family History/Social History/Meds/Allergies:    Past Medical History:  Diagnosis Date   Allergy    Anxiety    Elevated serum creatinine 01/2019   History of proteinuria syndrome 01/2019   Hypertension    Left flank pain 01/2019   PONV (postoperative nausea and vomiting)    Past Surgical History:  Procedure Laterality Date   ABDOMINAL HYSTERECTOMY     COLONOSCOPY WITH PROPOFOL  N/A 02/28/2023   Procedure: COLONOSCOPY WITH PROPOFOL ;  Surgeon: Federico Rosario JAYSON, MD;  Location: [facility];  Service: Gastroenterology;  Laterality: N/A;   CYSTOSCOPY N/A 11/08/2016   Procedure: CYSTOSCOPY;  Surgeon: Jannis Kate Norris, MD;  Location: [facility];  Service: Gynecology;  Laterality: N/A;   DILATION AND EVACUATION     miscarriage   TOTAL LAPAROSCOPIC HYSTERECTOMY WITH SALPINGECTOMY Bilateral 11/08/2016   Procedure: HYSTERECTOMY TOTAL LAPAROSCOPIC WITH BILATERAL SALPINGECTOMY;  Surgeon: Jannis Kate Norris, MD;  Location: [facility];  Service: Gynecology;  Laterality: Bilateral;   TUBAL LIGATION     Social History   Socioeconomic History   Marital status: Single    Spouse name: Not on file   Number of children: Not on file   Years of education: Not on file   Highest education level: 10th grade  Occupational History   Not on file  Tobacco Use   Smoking status: Never   Smokeless tobacco: Never  Vaping Use   Vaping status: Never Used  Substance and Sexual Activity   Alcohol use: Yes    Alcohol/week: 3.0 standard drinks of alcohol    Types: 3 Standard drinks or equivalent per week    Comment: occ   Drug use: Not Currently    Types: Marijuana    Comment: occ   Sexual activity: Not Currently    Partners: Male    Birth control/protection: Surgical  Other Topics Concern   Not on file  Social  History Narrative   Marital status: single; not dating.      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      Tobacco: none      Alcohol:  Socially; rarely.      Drugs: none; marijuana in the past.      Exercise: none      History of incarceration for six years (guilt by association; friend robbed bank).      Sexual activity:  Males; last STD Gonorrhea at age 57.  Syphilis in twenties.    Social Drivers of Corporate investment banker Strain: Low Risk  (11/16/2023)   Overall Financial Resource Strain (CARDIA)    Difficulty of Paying Living Expenses: Not very hard  Food Insecurity: Food Insecurity Present (11/16/2023)   Hunger Vital Sign    Worried About Running Out of Food in the Last Year: Sometimes true    Ran Out of Food in the Last Year: Sometimes true  Transportation Needs: No Transportation Needs (11/16/2023)   PRAPARE - Administrator, Civil Service (Medical): No    Lack of Transportation (Non-Medical): No  Physical Activity: Inactive (11/16/2023)   Exercise Vital Sign    Days of Exercise per Week: 1 day  Minutes of Exercise per Session: 0 min  Stress: Stress Concern Present (11/16/2023)   [employer] of Occupational Health - Occupational Stress Questionnaire    Feeling of Stress: To some extent  Social Connections: Socially Isolated (11/16/2023)   Social Connection and Isolation Panel    Frequency of Communication with Friends and Family: More than three times a week    Frequency of Social Gatherings with Friends and Family: Twice a week    Attends Religious Services: Never    Database administrator or Organizations: No    Attends Engineer, structural: Not on file    Marital Status: Never married   Family History  Problem Relation Age of Onset   Diabetes Mother    Hypertension Mother    Cancer Mother        liver    Hyperlipidemia Mother    Stroke Mother     Hypertension Sister    Diabetes Sister    Diabetes Maternal Uncle    Colon polyps Paternal Uncle    Colon cancer Paternal Uncle    Diabetes Maternal Grandmother    Hypertension Maternal Grandmother    Hyperlipidemia Maternal Grandmother    Heart disease Maternal Grandmother    Colon polyps Maternal Grandfather    Cancer Maternal Grandfather        prostate   Diabetes Maternal Grandfather    Hyperlipidemia Maternal Grandfather    Hypertension Maternal Grandfather    Colon cancer Maternal Grandfather    Breast cancer Neg Hx    Esophageal cancer Neg Hx    Rectal cancer Neg Hx    Stomach cancer Neg Hx    Allergies  Allergen Reactions   Lisinopril  Cough   Current Outpatient Medications  Medication Sig Dispense Refill   methylPREDNISolone  (MEDROL  DOSEPAK) 4 MG TBPK tablet Take per packet instructions 1 each 0   amLODipine  (NORVASC ) 5 MG tablet Take 1.5 tablets (7.5 mg total) by mouth daily. 90 tablet 1   cetirizine (ZYRTEC) 10 MG tablet Take 1 tablet by mouth daily.     dorzolamide-timolol (COSOPT) 2-0.5 % ophthalmic solution 1 drop 2 (two) times daily.     Ferrous Sulfate (IRON PO) Take 1 tablet by mouth daily. (Patient not taking: Reported on 11/17/2023)     fluticasone (FLONASE) 50 MCG/ACT nasal spray Place 1 spray into the nose.     gabapentin  (NEURONTIN ) 600 MG tablet Take 1 tablet by mouth twice daily 60 tablet 0   hydrochlorothiazide  (HYDRODIURIL ) 25 MG tablet Take 1 tablet (25 mg total) by mouth daily. 90 tablet 1   latanoprost (XALATAN) 0.005 % ophthalmic solution 1 drop at bedtime.     lidocaine  (LIDODERM ) 5 % USE 1 PATCH EXTERNALLY ONCE DAILY (REMOVE  &  DISCARD  PATCH  WITHIN  12  HOURS  OR  AS  DIRECTED  BY  MD) (Patient not taking: Reported on 11/17/2023) 30 patch 0   loratadine  (CLARITIN ) 10 MG tablet Take 1 tablet (10 mg total) by mouth daily. (Patient not taking: Reported on 11/17/2023) 30 tablet 11   meloxicam  (MOBIC ) 7.5 MG tablet TAKE 1 TABLET BY MOUTH ONCE DAILY  AS NEEDED WITH FOOD FOR PAIN 10 tablet 0   Multiple Vitamin (MULTIVITAMIN WITH MINERALS) TABS tablet Take 1 tablet by mouth 4 (four) times a week.     predniSONE  (STERAPRED UNI-PAK 21 TAB) 10 MG (21) TBPK tablet Take as instructed on the packaging, medication with food 1 each 0   sodium chloride  (OCEAN) 0.65 %  SOLN nasal spray Place 1 spray into both nostrils as needed. (Patient not taking: Reported on 11/17/2023) 30 mL 0   traZODone  (DESYREL ) 50 MG tablet Take 0.5 tablets (25 mg total) by mouth at bedtime as needed for sleep. (Patient not taking: Reported on 11/17/2023) 20 tablet 3   No current facility-administered medications for this visit.   No results found.  Review of Systems:   A ROS was performed including pertinent positives and negatives as documented in the HPI.  Physical Exam :   Constitutional: NAD and appears stated age Neurological: Alert and oriented Psych: Appropriate affect and cooperative Last menstrual period 10/17/2016.   Comprehensive Musculoskeletal Exam:    Positive Tinel right elbow with numbness in a flexed position.  Otherwise distal neurosensory exam is intact without muscular weakness   Imaging:     I personally reviewed and interpreted the radiographs.   Assessment and Plan:   53 y.o. female with evidence of right elbow cubital tunnel syndrome.  Her EMG nerve conduction test does show more of evidence of carpal tunnel.  And given this I do ultimately believe I would like her to see Dr. Erwin for discussion as her exam is really not consistent with this but more cubital tunnel syndrome.  She has trialed night splinting and I have today counseled her on wrist splints for night.  Will plan for referral to Dr. Erwin to see what her core etiology is more consistent with and if she could benefit from surgery  -Return to clinic as needed   I personally saw and evaluated the patient, and participated in the management and treatment plan.  Elspeth Parker,  MD Attending Physician, Orthopedic Surgery  This document was dictated using Dragon voice recognition software. A reasonable attempt at proof reading has been made to minimize errors.

## 2024-01-19 ENCOUNTER — Other Ambulatory Visit: Payer: Self-pay | Admitting: Nurse Practitioner

## 2024-01-19 DIAGNOSIS — M79605 Pain in left leg: Secondary | ICD-10-CM

## 2024-01-19 DIAGNOSIS — M79661 Pain in right lower leg: Secondary | ICD-10-CM

## 2024-01-19 DIAGNOSIS — M79641 Pain in right hand: Secondary | ICD-10-CM

## 2024-01-22 ENCOUNTER — Encounter: Payer: Self-pay | Admitting: Physical Medicine and Rehabilitation

## 2024-01-22 NOTE — Progress Notes (Signed)
 Abigail Beasley - 53 y.o. female MRN [id number]  Date of birth: 1970/11/03  Office Visit Note: Visit Date: 01/12/2024 PCP: Paseda, Folashade R, FNP Referred by: Genelle Standing, MD  Subjective: Chief Complaint  Patient presents with   Right Hand - Pain, Numbness, Weakness   Right Arm - Pain   HPI: Abigail Beasley is a 53 y.o. female who comes in today at the request of Dr. Standing Genelle for evaluation and management of chronic, worsening and severe pain, numbness and tingling in the Right upper extremities.  Patient is Right hand dominant.  She reports ongoing several month history of right upper extremity pain particularly in the arm into the hand.  She does get more symptoms at times in the 4th and 5th digit.  Dr. Genelle thought she may have a cubital tunnel issue.  She does get some global paresthesias in the hand as well.  She has felt like it has been getting weaker and she has difficulty opening objects and manipulating fine objects.  She does have a history of neck pain chronically but no history of cervical spine surgery.  She denies any left-sided complaints.  Symptoms are worse with activity and position.  She does report some nocturnal symptoms.  She has tried and failed medications etc. in time.  She has not had any prior electrodiagnostic studies.  She does carry a diagnosis of neuropathy and is on gabapentin  but she has no history of diabetes.   I spent more than 30 minutes speaking face-to-face with the patient with 50% of the time in counseling and discussing coordination of care.      Review of Systems  Musculoskeletal:  Positive for joint pain and neck pain.  Neurological:  Positive for tingling and focal weakness.  All other systems reviewed and are negative.  Otherwise per HPI.  Assessment & Plan: Visit Diagnoses:    ICD-10-CM   1. Paresthesia of skin  R20.2 NCV with EMG (electromyography)    2. Pain of right upper extremity  M79.601     3. Pain in right hand   M79.641     4. Right hand weakness  R29.898        Plan: Impression: Clinically complicated picture of symptoms which could be cubital tunnel or carpal tunnel to a degree although she does get some symptoms in the 4th and 5th digit.  Cannot rule out radiculopathy.  Does carry a diagnosis of polyneuropathy.  Electrodiagnostic study performed today.  The above electrodiagnostic study is ABNORMAL and reveals evidence of a mild right median nerve entrapment at the wrist (carpal tunnel syndrome) affecting sensory components.   There is no significant electrodiagnostic evidence of any other focal nerve entrapment, brachial plexopathy or cervical radiculopathy.  As you know, this particular electrodiagnostic study cannot rule out chemical radiculitis or sensory only radiculopathy.  Recommendations: 1.  Follow-up with referring physician. 2.  Continue current management of symptoms.  Clinical correlation is paramount.  If felt to be cervical radiculopathy consider cervical MRI. 3.  Continue use of resting splint at night-time and as needed during the day.   Meds & Orders: No orders of the defined types were placed in this encounter.   Orders Placed This Encounter  Procedures   NCV with EMG (electromyography)    Follow-up: Return for Standing Genelle, MD.   Procedures: No procedures performed  EMG & NCV Findings: Evaluation of the right median (across palm) sensory nerve showed prolonged distal peak latency (Wrist, 4.1 ms).  All remaining nerves (as indicated in the following tables) were within normal limits.    All examined muscles (as indicated in the following table) showed no evidence of electrical instability.    Impression: The above electrodiagnostic study is ABNORMAL and reveals evidence of a mild right median nerve entrapment at the wrist (carpal tunnel syndrome) affecting sensory components.   There is no significant electrodiagnostic evidence of any other focal nerve entrapment,  brachial plexopathy or cervical radiculopathy.  As you know, this particular electrodiagnostic study cannot rule out chemical radiculitis or sensory only radiculopathy.  Recommendations: 1.  Follow-up with referring physician. 2.  Continue current management of symptoms.  Clinical correlation is paramount.  If felt to be cervical radiculopathy consider cervical MRI. 3.  Continue use of resting splint at night-time and as needed during the day.  ___________________________ Prentice Eldonna BETTERS Board Certified, American Board of Physical Medicine and Rehabilitation    Nerve Conduction Studies Anti Sensory Summary Table   Stim Site NR Peak (ms) Norm Peak (ms) P-T Amp (V) Norm P-T Amp Site1 Site2 Delta-P (ms) Dist (cm) Vel (m/s) Norm Vel (m/s)  Right Median Acr Palm Anti Sensory (2nd Digit)  31.4C  Wrist    *4.1 <3.6 33.3 >10 Wrist Palm 2.2 0.0    Palm    1.9 <2.0 2.3         Right Radial Anti Sensory (Base 1st Digit)  32.7C  Wrist    2.0 <3.1 26.2  Wrist Base 1st Digit 2.0 0.0    Right Ulnar Anti Sensory (5th Digit)  32.2C  Wrist    2.8 <3.7 26.2 >15.0 Wrist 5th Digit 2.8 14.0 50 >38   Motor Summary Table   Stim Site NR Onset (ms) Norm Onset (ms) O-P Amp (mV) Norm O-P Amp Site1 Site2 Delta-0 (ms) Dist (cm) Vel (m/s) Norm Vel (m/s)  Right Median Motor (Abd Poll Brev)  32.6C  Wrist    3.8 <4.2 8.8 >5 Elbow Wrist 4.2 23.3 55 >50  Elbow    8.0  8.6         Right Ulnar Motor (Abd Dig Min)  32.4C  Wrist    2.7 <4.2 6.8 >3 B Elbow Wrist 3.4 20.0 59 >53  B Elbow    6.1  6.7  A Elbow B Elbow 1.1 10.0 91 >53  A Elbow    7.2  6.7          EMG   Side Muscle Nerve Root Ins Act Fibs Psw Amp Dur Poly Recrt Int Bruna Comment  Right Abd Poll Brev Median C8-T1 Nml Nml Nml Nml Nml 0 Nml Nml   Right 1stDorInt Ulnar C8-T1 Nml Nml Nml Nml Nml 0 Nml Nml   Right PronatorTeres Median C6-7 Nml Nml Nml Nml Nml 0 Nml Nml   Right Biceps Musculocut C5-6 Nml Nml Nml Nml Nml 0 Nml Nml   Right Deltoid  Axillary C5-6 Nml Nml Nml Nml Nml 0 Nml Nml     Nerve Conduction Studies Anti Sensory Left/Right Comparison   Stim Site L Lat (ms) R Lat (ms) L-R Lat (ms) L Amp (V) R Amp (V) L-R Amp (%) Site1 Site2 L Vel (m/s) R Vel (m/s) L-R Vel (m/s)  Median Acr Palm Anti Sensory (2nd Digit)  31.4C  Wrist  *4.1   33.3  Wrist Palm     Palm  1.9   2.3        Radial Anti Sensory (Base 1st Digit)  32.7C  Wrist  2.0   26.2  Wrist Base 1st Digit     Ulnar Anti Sensory (5th Digit)  32.2C  Wrist  2.8   26.2  Wrist 5th Digit  50    Motor Left/Right Comparison   Stim Site L Lat (ms) R Lat (ms) L-R Lat (ms) L Amp (mV) R Amp (mV) L-R Amp (%) Site1 Site2 L Vel (m/s) R Vel (m/s) L-R Vel (m/s)  Median Motor (Abd Poll Brev)  32.6C  Wrist  3.8   8.8  Elbow Wrist  55   Elbow  8.0   8.6        Ulnar Motor (Abd Dig Min)  32.4C  Wrist  2.7   6.8  B Elbow Wrist  59   B Elbow  6.1   6.7  A Elbow B Elbow  91   A Elbow  7.2   6.7           Waveforms:            Clinical History: No specialty comments available.   She reports that she has never smoked. She has never used smokeless tobacco.  Recent Labs    04/04/23 1143  HGBA1C 5.9*    Objective:  VS:  HT:    WT:   BMI:     BP:   HR: bpm  TEMP: ( )  RESP:  Physical Exam Vitals and nursing note reviewed.  Constitutional:      General: She is not in acute distress.    Appearance: Normal appearance. She is well-developed. She is obese. She is not ill-appearing.  HENT:     Head: Normocephalic and atraumatic.  Eyes:     Conjunctiva/sclera: Conjunctivae normal.     Pupils: Pupils are equal, round, and reactive to light.  Cardiovascular:     Rate and Rhythm: Normal rate.     Pulses: Normal pulses.  Pulmonary:     Effort: Pulmonary effort is normal.  Musculoskeletal:        General: Tenderness present. No swelling or deformity.     Right lower leg: No edema.     Left lower leg: No edema.     Comments: Inspection reveals no atrophy of the  bilateral APB or FDI or hand intrinsics. There is no swelling, color changes, allodynia or dystrophic changes. There is 5 out of 5 strength in the bilateral wrist extension, finger abduction and long finger flexion. There is intact sensation to light touch in all dermatomal and peripheral nerve distributions. There is a negative Froment's test bilaterally. There is a negative Tinel's test at the bilateral wrist and elbow. There is a negative Phalen's test bilaterally. There is a negative Hoffmann's test bilaterally.  Skin:    General: Skin is warm and dry.     Findings: No erythema or rash.  Neurological:     General: No focal deficit present.     Mental Status: She is alert and oriented to person, place, and time.     Cranial Nerves: No cranial nerve deficit.     Sensory: No sensory deficit.     Motor: No weakness or abnormal muscle tone.     Coordination: Coordination normal.     Gait: Gait normal.  Psychiatric:        Mood and Affect: Mood normal.        Behavior: Behavior normal.     Ortho Exam  Imaging: No results found.  Past Medical/Family/Surgical/Social History: Medications & Allergies reviewed per EMR, new medications  updated. Patient Active Problem List   Diagnosis Date Noted   Right hand pain 11/17/2023   Right arm pain 11/17/2023   Chronic right-sided low back pain with right-sided sciatica 10/10/2023   Pain of right lower leg 10/10/2023   Allergic rhinitis due to allergen 10/10/2023   Prediabetes 07/04/2023   Anxiety and depression 07/04/2023   Bilateral hand pain 07/04/2023   Neuropathy 07/04/2023   Colon cancer screening 02/28/2023   Trichomonas vaginitis 08/15/2020   Status post laparoscopic hysterectomy 11/08/2016   Severe obesity (BMI >= 40) (HCC) 07/28/2014   Dizziness and giddiness 03/18/2013   Essential hypertension, benign 03/18/2013   Breast pain, left 03/18/2013   Past Medical History:  Diagnosis Date   Allergy    Anxiety    Elevated serum  creatinine 01/2019   History of proteinuria syndrome 01/2019   Hypertension    Left flank pain 01/2019   PONV (postoperative nausea and vomiting)    Family History  Problem Relation Age of Onset   Diabetes Mother    Hypertension Mother    Cancer Mother        liver    Hyperlipidemia Mother    Stroke Mother    Hypertension Sister    Diabetes Sister    Diabetes Maternal Uncle    Colon polyps Paternal Uncle    Colon cancer Paternal Uncle    Diabetes Maternal Grandmother    Hypertension Maternal Grandmother    Hyperlipidemia Maternal Grandmother    Heart disease Maternal Grandmother    Colon polyps Maternal Grandfather    Cancer Maternal Grandfather        prostate   Diabetes Maternal Grandfather    Hyperlipidemia Maternal Grandfather    Hypertension Maternal Grandfather    Colon cancer Maternal Grandfather    Breast cancer Neg Hx    Esophageal cancer Neg Hx    Rectal cancer Neg Hx    Stomach cancer Neg Hx    Past Surgical History:  Procedure Laterality Date   ABDOMINAL HYSTERECTOMY     COLONOSCOPY WITH PROPOFOL  N/A 02/28/2023   Procedure: COLONOSCOPY WITH PROPOFOL ;  Surgeon: Federico Rosario BROCKS, MD;  Location: [facility];  Service: Gastroenterology;  Laterality: N/A;   CYSTOSCOPY N/A 11/08/2016   Procedure: CYSTOSCOPY;  Surgeon: Jannis Kate Norris, MD;  Location: [facility];  Service: Gynecology;  Laterality: N/A;   DILATION AND EVACUATION     miscarriage   TOTAL LAPAROSCOPIC HYSTERECTOMY WITH SALPINGECTOMY Bilateral 11/08/2016   Procedure: HYSTERECTOMY TOTAL LAPAROSCOPIC WITH BILATERAL SALPINGECTOMY;  Surgeon: Jannis Kate Norris, MD;  Location: [facility];  Service: Gynecology;  Laterality: Bilateral;   TUBAL LIGATION     Social History   Occupational History   Not on file  Tobacco Use   Smoking status: Never   Smokeless tobacco: Never  Vaping Use   Vaping status: Never Used  Substance and Sexual Activity   Alcohol use: Yes    Alcohol/week: 3.0 standard drinks of  alcohol    Types: 3 Standard drinks or equivalent per week    Comment: occ   Drug use: Not Currently    Types: Marijuana    Comment: occ   Sexual activity: Not Currently    Partners: Male    Birth control/protection: Surgical

## 2024-02-07 ENCOUNTER — Other Ambulatory Visit: Payer: Self-pay | Admitting: Nurse Practitioner

## 2024-02-07 DIAGNOSIS — M79641 Pain in right hand: Secondary | ICD-10-CM

## 2024-02-07 DIAGNOSIS — M79661 Pain in right lower leg: Secondary | ICD-10-CM

## 2024-02-07 DIAGNOSIS — M79605 Pain in left leg: Secondary | ICD-10-CM

## 2024-02-08 NOTE — Telephone Encounter (Signed)
 Please advise [facility]

## 2024-02-12 ENCOUNTER — Encounter: Payer: Self-pay | Admitting: Nurse Practitioner

## 2024-02-12 ENCOUNTER — Ambulatory Visit: Payer: Self-pay | Admitting: Nurse Practitioner

## 2024-02-12 ENCOUNTER — Encounter: Payer: Self-pay | Admitting: Orthopedic Surgery

## 2024-02-12 VITALS — BP 147/74 | HR 69 | Temp 96.8°F | Wt 286.0 lb

## 2024-02-12 DIAGNOSIS — J309 Allergic rhinitis, unspecified: Secondary | ICD-10-CM

## 2024-02-12 DIAGNOSIS — G8929 Other chronic pain: Secondary | ICD-10-CM

## 2024-02-12 DIAGNOSIS — M79674 Pain in right toe(s): Secondary | ICD-10-CM

## 2024-02-12 DIAGNOSIS — I10 Essential (primary) hypertension: Secondary | ICD-10-CM

## 2024-02-12 DIAGNOSIS — F32A Depression, unspecified: Secondary | ICD-10-CM

## 2024-02-12 DIAGNOSIS — K5900 Constipation, unspecified: Secondary | ICD-10-CM

## 2024-02-12 DIAGNOSIS — G629 Polyneuropathy, unspecified: Secondary | ICD-10-CM

## 2024-02-12 DIAGNOSIS — R232 Flushing: Secondary | ICD-10-CM

## 2024-02-12 DIAGNOSIS — N1831 Chronic kidney disease, stage 3a: Secondary | ICD-10-CM

## 2024-02-12 DIAGNOSIS — G894 Chronic pain syndrome: Secondary | ICD-10-CM

## 2024-02-12 DIAGNOSIS — F419 Anxiety disorder, unspecified: Secondary | ICD-10-CM

## 2024-02-12 MED ORDER — LOSARTAN POTASSIUM 25 MG PO TABS: 25.0000 mg | ORAL_TABLET | Freq: Every day | ORAL | 1 refills | Status: DC

## 2024-02-12 MED ORDER — HYDROCHLOROTHIAZIDE 25 MG PO TABS: 25.0000 mg | ORAL_TABLET | Freq: Every day | ORAL | 1 refills | Status: DC

## 2024-02-12 MED ORDER — AMLODIPINE BESYLATE 5 MG PO TABS: 7.5000 mg | ORAL_TABLET | Freq: Every day | ORAL | 1 refills | Status: DC

## 2024-02-12 MED ORDER — DULOXETINE HCL 30 MG PO CPEP: 30.0000 mg | ORAL_CAPSULE | Freq: Every day | ORAL | 2 refills | Status: DC

## 2024-02-12 NOTE — Patient Instructions (Addendum)
 Start duloxetine  30 mg once daily for 2 weeks, then increase to 60 mg once daily this medication will help with chronic pain which will also help with your mood  Please take over-the-counter MiraLAX as needed for constipation okay to take some stool softener if MiraLAX alone is not helpful  For blood pressure continue hydrochlorothiazide  25 mg daily, amlodipine  7.5 mg daily start losartan  25 mg daily  Please report welling of the lips, tongue, or throat,    Labs today, and then labs in 2 weeks  Around 3 times per week, check your blood pressure 2 times per day. once in the morning and once in the evening. The readings should be at least one minute apart. Write down these values and bring them to your next nurse visit/appointment.  When you check your BP, make sure you have been doing something calm/relaxing 5 minutes prior to checking. Both feet should be flat on the floor and you should be sitting. Use your left arm and make sure it is in a relaxed position (on a table), and that the cuff is at the approximate level/height of your heart.  Blood pressure goal is less than 130/80   It is important that you exercise regularly at least 30 minutes 5 times a week as tolerated  Think about what you will eat, plan ahead. Choose  clean, green, fresh or frozen over canned, processed or packaged foods which are more sugary, salty and fatty. 70 to 75% of food eaten should be vegetables and fruit. Three meals at set times with snacks allowed between meals, but they must be fruit or vegetables. Aim to eat over a 12 hour period , example 7 am to 7 pm, and STOP after  your last meal of the day. Drink water ,generally about 64 ounces per day, no other drink is as healthy. Fruit juice is best enjoyed in a healthy way, by EATING the fruit.  Thanks for choosing [facility] we consider it a privelige to serve you.

## 2024-02-12 NOTE — Assessment & Plan Note (Addendum)
 BP Readings from Last 3 Encounters:  02/12/24 (!) 147/74  11/17/23 (!) 162/89  10/10/23 135/76  Continue amlodipine  7.5 mg daily, hydrochlorothiazide  25 mg daily Start losartan  25 mg daily Blood pressure goal is less than 130/80 Discussed DASH diet and dietary sodium restrictions Continue to increase dietary efforts and exercise.  BMP today, NP in 2 weeks, follow-up in 6 weeks report swelling of the lips, tongue, or throat, dizziness

## 2024-02-12 NOTE — Progress Notes (Signed)
 Established Patient Office Visit  Subjective:  Patient ID: Abigail Beasley, female    DOB: 11/03/1970  Age: 53 y.o. MRN: [id number]  CC:  Chief Complaint  Patient presents with   Hypertension    HPI  Discussed the use of AI scribe software for clinical note transcription with the patient, who gave verbal consent to proceed.  History of Present Illness Abigail Beasley is a 53 year old female  has a past medical history of Allergy, Anxiety, Elevated serum creatinine (01/2019), History of proteinuria syndrome (01/2019), Hypertension, Left flank pain (01/2019), and PONV (postoperative nausea and vomiting). who presents for a routine checkup to follow up on her blood pressure management.  She has hypertension and is currently taking amlodipine  7.5 mg daily and hydrochlorothiazide  25 mg daily. No new medication allergies are reported.  Allergy to lisinopril   She is managing chronic pain with gabapentin , currently taking 600 mg three times a day, but this dosage is not providing adequate relief. She has been evaluated by an orthopedist and diagnosed with nerve damage and carpal tunnel syndrome, resulting in pain radiating up her arm.   She has a history of kidney disease, with an eGFR of 49. She takes meloxicam  sparingly for toe pain following a fracture. She is not on any specific medication for kidney disease.  She experiences menopausal symptoms, including constant hot flashes and difficulty sleeping due to heat. She had a hysterectomy in the past. She uses a fan at night to manage these symptoms and is not on hormone replacement therapy.  She feels nauseated for the past few days and describes a recent episode of severe abdominal pain after eating fried chicken, which she likens to 'having a contraction'. She feels constipated but has not taken any medication for it, relying instead on dietary intake of greens and cranberry juice.  She is experiencing symptoms of depression and anxiety,  attributed to recent job loss and financial stress. She has started a new job in housekeeping, which she finds physically demanding and not ideal. She is not taking any medication for depression or anxiety and has not engaged in therapy.  She reports occasional use of allergy medications, including Flonase nasal spray and Claritin  10 mg, but does not use them regularly. She experiences nasal congestion, which she attributes to sleeping with a fan on. No chest pain or shortness of breath.     Assessment & Plan      Past Medical History:  Diagnosis Date   Allergy    Anxiety    Elevated serum creatinine 01/2019   History of proteinuria syndrome 01/2019   Hypertension    Left flank pain 01/2019   PONV (postoperative nausea and vomiting)     Past Surgical History:  Procedure Laterality Date   ABDOMINAL HYSTERECTOMY     COLONOSCOPY WITH PROPOFOL  N/A 02/28/2023   Procedure: COLONOSCOPY WITH PROPOFOL ;  Surgeon: Federico Rosario JAYSON, MD;  Location: [facility];  Service: Gastroenterology;  Laterality: N/A;   CYSTOSCOPY N/A 11/08/2016   Procedure: CYSTOSCOPY;  Surgeon: Jannis Kate Norris, MD;  Location: [facility];  Service: Gynecology;  Laterality: N/A;   DILATION AND EVACUATION     miscarriage   TOTAL LAPAROSCOPIC HYSTERECTOMY WITH SALPINGECTOMY Bilateral 11/08/2016   Procedure: HYSTERECTOMY TOTAL LAPAROSCOPIC WITH BILATERAL SALPINGECTOMY;  Surgeon: Jannis Kate Norris, MD;  Location: [facility];  Service: Gynecology;  Laterality: Bilateral;   TUBAL LIGATION      Family History  Problem Relation Age of Onset   Diabetes Mother  Hypertension Mother    Cancer Mother        liver    Hyperlipidemia Mother    Stroke Mother    Hypertension Sister    Diabetes Sister    Diabetes Maternal Uncle    Colon polyps Paternal Uncle    Colon cancer Paternal Uncle    Diabetes Maternal Grandmother    Hypertension Maternal Grandmother    Hyperlipidemia Maternal Grandmother    Heart disease Maternal  Grandmother    Colon polyps Maternal Grandfather    Cancer Maternal Grandfather        prostate   Diabetes Maternal Grandfather    Hyperlipidemia Maternal Grandfather    Hypertension Maternal Grandfather    Colon cancer Maternal Grandfather    Breast cancer Neg Hx    Esophageal cancer Neg Hx    Rectal cancer Neg Hx    Stomach cancer Neg Hx     Social History   Socioeconomic History   Marital status: Single    Spouse name: Not on file   Number of children: Not on file   Years of education: Not on file   Highest education level: 10th grade  Occupational History   Not on file  Tobacco Use   Smoking status: Never   Smokeless tobacco: Never  Vaping Use   Vaping status: Never Used  Substance and Sexual Activity   Alcohol use: Yes    Alcohol/week: 3.0 standard drinks of alcohol    Types: 3 Standard drinks or equivalent per week    Comment: occ   Drug use: Not Currently    Types: Marijuana    Comment: occ   Sexual activity: Not Currently    Partners: Male    Birth control/protection: Surgical  Other Topics Concern   Not on file  Social History Narrative   Marital status: single; not dating.      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      Tobacco: none      Alcohol:  Socially; rarely.      Drugs: none; marijuana in the past.      Exercise: none      History of incarceration for six years (guilt by association; friend robbed bank).      Sexual activity:  Males; last STD Gonorrhea at age 70.  Syphilis in twenties.    Social Drivers of Corporate investment banker Strain: Low Risk  (11/16/2023)   Overall Financial Resource Strain (CARDIA)    Difficulty of Paying Living Expenses: Not very hard  Food Insecurity: Food Insecurity Present (11/16/2023)   Hunger Vital Sign    Worried About Running Out of Food in the Last Year: Sometimes true    Ran Out of Food in the Last Year:  Sometimes true  Transportation Needs: No Transportation Needs (11/16/2023)   PRAPARE - Administrator, Civil Service (Medical): No    Lack of Transportation (Non-Medical): No  Physical Activity: Inactive (11/16/2023)   Exercise Vital Sign    Days of Exercise per Week: 1 day    Minutes of Exercise per Session: 0 min  Stress: Stress Concern Present (11/16/2023)   [employer] of Occupational Health - Occupational Stress Questionnaire    Feeling of Stress: To some extent  Social Connections: Socially Isolated (11/16/2023)   Social Connection and Isolation Panel    Frequency of Communication with  Friends and Family: More than three times a week    Frequency of Social Gatherings with Friends and Family: Twice a week    Attends Religious Services: Never    Database administrator or Organizations: No    Attends Engineer, structural: Not on file    Marital Status: Never married  Intimate Partner Violence: Not on file    Outpatient Medications Prior to Visit  Medication Sig Dispense Refill   cetirizine (ZYRTEC) 10 MG tablet Take 1 tablet by mouth daily.     dorzolamide-timolol (COSOPT) 2-0.5 % ophthalmic solution 1 drop 2 (two) times daily.     gabapentin  (NEURONTIN ) 600 MG tablet Take 1 tablet by mouth twice daily 60 tablet 0   latanoprost (XALATAN) 0.005 % ophthalmic solution 1 drop at bedtime.     meloxicam  (MOBIC ) 7.5 MG tablet TAKE 1 TABLET BY MOUTH ONCE DAILY AS NEEDED WITH FOOD FOR PAIN 10 tablet 0   Multiple Vitamin (MULTIVITAMIN WITH MINERALS) TABS tablet Take 1 tablet by mouth 4 (four) times a week.     amLODipine  (NORVASC ) 5 MG tablet Take 1.5 tablets (7.5 mg total) by mouth daily. 90 tablet 1   hydrochlorothiazide  (HYDRODIURIL ) 25 MG tablet Take 1 tablet (25 mg total) by mouth daily. 90 tablet 1   Ferrous Sulfate (IRON PO) Take 1 tablet by mouth daily. (Patient not taking: Reported on 02/12/2024)     fluticasone (FLONASE) 50 MCG/ACT nasal spray Place 1 spray  into the nose. (Patient not taking: Reported on 02/12/2024)     lidocaine  (LIDODERM ) 5 % USE 1 PATCH EXTERNALLY ONCE DAILY (REMOVE  &  DISCARD  PATCH  WITHIN  12  HOURS  OR  AS  DIRECTED  BY  MD) (Patient not taking: Reported on 02/12/2024) 30 patch 0   loratadine  (CLARITIN ) 10 MG tablet Take 1 tablet (10 mg total) by mouth daily. (Patient not taking: Reported on 02/12/2024) 30 tablet 11   sodium chloride  (OCEAN) 0.65 % SOLN nasal spray Place 1 spray into both nostrils as needed. (Patient not taking: Reported on 02/12/2024) 30 mL 0   traZODone  (DESYREL ) 50 MG tablet Take 0.5 tablets (25 mg total) by mouth at bedtime as needed for sleep. (Patient not taking: Reported on 02/12/2024) 20 tablet 3   methylPREDNISolone  (MEDROL  DOSEPAK) 4 MG TBPK tablet Take per packet instructions (Patient not taking: Reported on 02/12/2024) 1 each 0   predniSONE  (STERAPRED UNI-PAK 21 TAB) 10 MG (21) TBPK tablet Take as instructed on the packaging, medication with food (Patient not taking: Reported on 02/12/2024) 1 each 0   No facility-administered medications prior to visit.    Allergies  Allergen Reactions   Lisinopril  Cough    ROS Review of Systems  Constitutional:  Negative for appetite change, chills, fatigue and fever.  HENT:  Negative for congestion, postnasal drip, rhinorrhea and sneezing.   Respiratory:  Negative for cough, shortness of breath and wheezing.   Cardiovascular:  Negative for chest pain, palpitations and leg swelling.  Gastrointestinal:  Negative for abdominal pain, constipation, nausea and vomiting.  Genitourinary:  Negative for difficulty urinating, dysuria, flank pain and frequency.  Musculoskeletal:  Positive for arthralgias and back pain. Negative for joint swelling and myalgias.  Skin:  Negative for color change, pallor, rash and wound.  Neurological:  Negative for dizziness, facial asymmetry, weakness, numbness and headaches.  Psychiatric/Behavioral:  Negative for behavioral problems,  confusion, self-injury and suicidal ideas.       Objective:    Physical  Exam Vitals and nursing note reviewed.  Constitutional:      General: She is not in acute distress.    Appearance: Normal appearance. She is obese. She is not ill-appearing, toxic-appearing or diaphoretic.  Eyes:     General: No scleral icterus.       Right eye: No discharge.        Left eye: No discharge.     Extraocular Movements: Extraocular movements intact.     Conjunctiva/sclera: Conjunctivae normal.  Cardiovascular:     Rate and Rhythm: Normal rate and regular rhythm.     Pulses: Normal pulses.     Heart sounds: Normal heart sounds. No murmur heard.    No friction rub. No gallop.  Pulmonary:     Effort: Pulmonary effort is normal. No respiratory distress.     Breath sounds: Normal breath sounds. No stridor. No wheezing, rhonchi or rales.  Chest:     Chest wall: No tenderness.  Abdominal:     General: There is no distension.     Palpations: Abdomen is soft.     Tenderness: There is no abdominal tenderness. There is no right CVA tenderness, left CVA tenderness or guarding.  Musculoskeletal:        General: Tenderness present. No swelling, deformity or signs of injury.     Right lower leg: No edema.     Left lower leg: No edema.     Comments: Tenderness on palpation of low back, right 4th and 5th toes, right arm No swelling or redness noted skin warm and dry  Skin:    General: Skin is warm and dry.     Capillary Refill: Capillary refill takes less than 2 seconds.     Coloration: Skin is not jaundiced or pale.     Findings: No bruising, erythema or lesion.  Neurological:     Mental Status: She is alert and oriented to person, place, and time.     Motor: No weakness.     Gait: Gait normal.  Psychiatric:        Mood and Affect: Mood normal.        Behavior: Behavior normal.        Thought Content: Thought content normal.        Judgment: Judgment normal.     BP (!) 147/74   Pulse 69   Temp  (!) 96.8 F (36 C)   Wt 286 lb (129.7 kg)   LMP 10/17/2016 (Exact Date)   SpO2 98%   BMI 44.79 kg/m  Wt Readings from Last 3 Encounters:  02/12/24 286 lb (129.7 kg)  11/17/23 294 lb (133.4 kg)  10/10/23 293 lb (132.9 kg)    Lab Results  Component Value Date   TSH 1.750 04/04/2023   Lab Results  Component Value Date   WBC 6.6 03/14/2023   HGB 14.2 03/14/2023   HCT 42.4 03/14/2023   MCV 90.4 03/14/2023   PLT 279 03/14/2023   Lab Results  Component Value Date   NA 142 10/10/2023   K 4.0 10/10/2023   CO2 20 10/10/2023   GLUCOSE 89 10/10/2023   BUN 23 10/10/2023   CREATININE 1.29 (H) 10/10/2023   BILITOT <0.2 10/10/2023   ALKPHOS 95 10/10/2023   AST 13 10/10/2023   ALT 13 10/10/2023   PROT 6.8 10/10/2023   ALBUMIN 4.1 10/10/2023   CALCIUM 9.5 10/10/2023   ANIONGAP 9 03/14/2023   EGFR 49 (L) 10/10/2023   Lab Results  Component Value Date  CHOL 170 08/11/2020   Lab Results  Component Value Date   HDL 61 08/11/2020   Lab Results  Component Value Date   LDLCALC 94 08/11/2020   Lab Results  Component Value Date   TRIG 83 08/11/2020   Lab Results  Component Value Date   CHOLHDL 2.8 08/11/2020   Lab Results  Component Value Date   HGBA1C 5.9 (A) 04/04/2023      Assessment & Plan:   Problem List Items Addressed This Visit       Cardiovascular and Mediastinum   Essential hypertension, benign   BP Readings from Last 3 Encounters:  02/12/24 (!) 147/74  11/17/23 (!) 162/89  10/10/23 135/76  Continue amlodipine  7.5 mg daily, hydrochlorothiazide  25 mg daily Start losartan  25 mg daily Blood pressure goal is less than 130/80 Discussed DASH diet and dietary sodium restrictions Continue to increase dietary efforts and exercise.  BMP today, NP in 2 weeks, follow-up in 6 weeks report swelling of the lips, tongue, or throat, dizziness        Relevant Medications   losartan  (COZAAR ) 25 MG tablet   amLODipine  (NORVASC ) 5 MG tablet    hydrochlorothiazide  (HYDRODIURIL ) 25 MG tablet   Hot flashes    Experiencing hot flashes and Discussed non-hormonal options and hormone replacement therapy referral. - Continue gabapentin , which may help with symptoms. - Offer referral to a gynecologist for hormone replacement therapy if interested.       Relevant Medications   losartan  (COZAAR ) 25 MG tablet   amLODipine  (NORVASC ) 5 MG tablet   hydrochlorothiazide  (HYDRODIURIL ) 25 MG tablet   Other Relevant Orders   Ambulatory referral to Gynecology   TSH     Respiratory   Allergic rhinitis due to allergen    Intermittent symptoms possibly exacerbated by environmental factors. Has Flonase and Claritin  but not using regularly. - Encourage regular use of Flonase nasal spray. - Encourage regular use of Claritin  10 mg daily as needed.        Nervous and Auditory   Neuropathy   Relevant Medications   DULoxetine  (CYMBALTA ) 30 MG capsule     Genitourinary   CKD stage 3a, GFR 45-59 ml/min (HCC) - Primary   Lab Results  Component Value Date   NA 142 10/10/2023   K 4.0 10/10/2023   CO2 20 10/10/2023   GLUCOSE 89 10/10/2023   BUN 23 10/10/2023   CREATININE 1.29 (H) 10/10/2023   CALCIUM 9.5 10/10/2023   EGFR 49 (L) 10/10/2023   GFRNONAA 58 (L) 03/14/2023   eGFR 49 indicates reduced kidney function. Discussed importance of blood pressure control and avoiding NSAIDs. - Start losartan  25 mg daily. - Advise hydration with at least 64 ounces of water  daily. - Avoid NSAIDs such as ibuprofen , Aleve , and meloxicam .       Relevant Medications   losartan  (COZAAR ) 25 MG tablet     Other   Severe obesity (BMI >= 40) (HCC)   Wt Readings from Last 3 Encounters:  02/12/24 286 lb (129.7 kg)  11/17/23 294 lb (133.4 kg)  10/10/23 293 lb (132.9 kg)    Body mass index is 44.79 kg/m.    Obesity with no current diet or exercise regimen. Discussed benefits of heart-healthy diet and exercise for weight and blood pressure control. -  Encourage a  low carb  - Advise moderate to vigorous exercise for 30 minutes, five days a week as tolerated.       Anxiety and depression      02/12/2024  10:04 AM 11/17/2023    3:05 PM 10/10/2023   10:49 AM 07/04/2023   10:47 AM  GAD 7 : Generalized Anxiety Score  Nervous, Anxious, on Edge 2 0 0 1  Control/stop worrying 3 0 0 3  Worry too much - different things 3 0 0 3  Trouble relaxing 1 0 0 2  Restless 0 0 0 3  Easily annoyed or irritable 3 0 0 3  Afraid - awful might happen 0 0 0 2  Total GAD 7 Score 12 0 0 17  Anxiety Difficulty Somewhat difficult Not difficult at all Not difficult at all Not difficult at all        02/12/2024   10:01 AM 11/17/2023    3:04 PM 10/10/2023   10:49 AM 07/04/2023   10:44 AM 04/04/2023   10:46 AM  Depression screen PHQ 2/9  Decreased Interest 3 0 0 3 0  Down, Depressed, Hopeless 1 0 0 3 0  PHQ - 2 Score 4 0 0 6 0  Altered sleeping 3 3  0 0  Tired, decreased energy 3 3  1 3   Change in appetite 0 0  0 0  Feeling bad or failure about yourself  1 0  0 0  Trouble concentrating 0 0  0 0  Moving slowly or fidgety/restless 3 0  1 1  Suicidal thoughts 0 0  0 0  PHQ-9 Score 14 6  8 4   Difficult doing work/chores Somewhat difficult   Not difficult at all Not difficult at all  Initially refused treatment Starting duloxetine  for chronic pain She denies SI, HI      Relevant Medications   DULoxetine  (CYMBALTA ) 30 MG capsule   Chronic pain   Continue gabapentin  600 mg 3 times daily Start duloxetine  30 mg daily after 2 weeks increase to 60 mg daily       Relevant Medications   DULoxetine  (CYMBALTA ) 30 MG capsule   Chronic toe pain, right foot    Chronic toe pain post-fracture. Surgery pending. Limited meloxicam  use discussed due to kidney concerns. - Advise limited use of meloxicam , not for daily use. - Recommend Tylenol  as an alternative for pain management.      Relevant Medications   DULoxetine  (CYMBALTA ) 30 MG capsule   Constipation      Acute constipation Acute constipation possibly related to dietary factors. Discussed dietary modifications and over-the-counter options. - Recommend Miralax 17 grams daily as needed. - Suggest over-the-counter stool softener if needed. - Encourage increased intake of vegetables and hydration.         Meds ordered this encounter  Medications   losartan  (COZAAR ) 25 MG tablet    Sig: Take 1 tablet (25 mg total) by mouth daily.    Dispense:  30 tablet    Refill:  1   DULoxetine  (CYMBALTA ) 30 MG capsule    Sig: Take 1 capsule (30 mg total) by mouth daily.    Dispense:  60 capsule    Refill:  2   amLODipine  (NORVASC ) 5 MG tablet    Sig: Take 1.5 tablets (7.5 mg total) by mouth daily.    Dispense:  90 tablet    Refill:  1   hydrochlorothiazide  (HYDRODIURIL ) 25 MG tablet    Sig: Take 1 tablet (25 mg total) by mouth daily.    Dispense:  90 tablet    Refill:  1    Follow-up: Return in about 6 weeks (around 03/25/2024) for HTN.    Taraji Mungo R Melody Savidge,  FNP

## 2024-02-12 NOTE — Assessment & Plan Note (Signed)
 Continue gabapentin  600 mg 3 times daily Start duloxetine  30 mg daily after 2 weeks increase to 60 mg daily

## 2024-02-12 NOTE — Assessment & Plan Note (Signed)
    02/12/2024   10:04 AM 11/17/2023    3:05 PM 10/10/2023   10:49 AM 07/04/2023   10:47 AM  GAD 7 : Generalized Anxiety Score  Nervous, Anxious, on Edge 2 0 0 1  Control/stop worrying 3 0 0 3  Worry too much - different things 3 0 0 3  Trouble relaxing 1 0 0 2  Restless 0 0 0 3  Easily annoyed or irritable 3 0 0 3  Afraid - awful might happen 0 0 0 2  Total GAD 7 Score 12 0 0 17  Anxiety Difficulty Somewhat difficult Not difficult at all Not difficult at all Not difficult at all        02/12/2024   10:01 AM 11/17/2023    3:04 PM 10/10/2023   10:49 AM 07/04/2023   10:44 AM 04/04/2023   10:46 AM  Depression screen PHQ 2/9  Decreased Interest 3 0 0 3 0  Down, Depressed, Hopeless 1 0 0 3 0  PHQ - 2 Score 4 0 0 6 0  Altered sleeping 3 3  0 0  Tired, decreased energy 3 3  1 3   Change in appetite 0 0  0 0  Feeling bad or failure about yourself  1 0  0 0  Trouble concentrating 0 0  0 0  Moving slowly or fidgety/restless 3 0  1 1  Suicidal thoughts 0 0  0 0  PHQ-9 Score 14 6  8 4   Difficult doing work/chores Somewhat difficult   Not difficult at all Not difficult at all  Initially refused treatment Starting duloxetine  for chronic pain She denies SI, HI

## 2024-02-12 NOTE — Assessment & Plan Note (Addendum)
 Wt Readings from Last 3 Encounters:  02/12/24 286 lb (129.7 kg)  11/17/23 294 lb (133.4 kg)  10/10/23 293 lb (132.9 kg)    Body mass index is 44.79 kg/m.    Obesity with no current diet or exercise regimen. Discussed benefits of heart-healthy diet and exercise for weight and blood pressure control. - Encourage a  low carb  - Advise moderate to vigorous exercise for 30 minutes, five days a week as tolerated.

## 2024-02-12 NOTE — Assessment & Plan Note (Signed)
  Intermittent symptoms possibly exacerbated by environmental factors. Has Flonase and Claritin  but not using regularly. - Encourage regular use of Flonase nasal spray. - Encourage regular use of Claritin  10 mg daily as needed.

## 2024-02-12 NOTE — Assessment & Plan Note (Signed)
   Acute constipation Acute constipation possibly related to dietary factors. Discussed dietary modifications and over-the-counter options. - Recommend Miralax 17 grams daily as needed. - Suggest over-the-counter stool softener if needed. - Encourage increased intake of vegetables and hydration.

## 2024-02-12 NOTE — Assessment & Plan Note (Addendum)
  Experiencing hot flashes and Discussed non-hormonal options and hormone replacement therapy referral. - Continue gabapentin , which may help with symptoms. - Offer referral to a gynecologist for hormone replacement therapy if interested.

## 2024-02-12 NOTE — Assessment & Plan Note (Signed)
  Chronic toe pain post-fracture. Surgery pending. Limited meloxicam  use discussed due to kidney concerns. - Advise limited use of meloxicam , not for daily use. - Recommend Tylenol  as an alternative for pain management.

## 2024-02-12 NOTE — Assessment & Plan Note (Signed)
 Lab Results  Component Value Date   NA 142 10/10/2023   K 4.0 10/10/2023   CO2 20 10/10/2023   GLUCOSE 89 10/10/2023   BUN 23 10/10/2023   CREATININE 1.29 (H) 10/10/2023   CALCIUM 9.5 10/10/2023   EGFR 49 (L) 10/10/2023   GFRNONAA 58 (L) 03/14/2023   eGFR 49 indicates reduced kidney function. Discussed importance of blood pressure control and avoiding NSAIDs. - Start losartan  25 mg daily. - Advise hydration with at least 64 ounces of water  daily. - Avoid NSAIDs such as ibuprofen , Aleve , and meloxicam .

## 2024-02-13 ENCOUNTER — Encounter: Payer: Self-pay | Admitting: Orthopedic Surgery

## 2024-02-19 ENCOUNTER — Other Ambulatory Visit: Payer: Self-pay

## 2024-02-19 NOTE — Progress Notes (Signed)
 This encounter was created in error - please disregard.

## 2024-02-27 ENCOUNTER — Ambulatory Visit (INDEPENDENT_AMBULATORY_CARE_PROVIDER_SITE_OTHER): Payer: Self-pay | Admitting: Orthopedic Surgery

## 2024-02-27 ENCOUNTER — Other Ambulatory Visit (INDEPENDENT_AMBULATORY_CARE_PROVIDER_SITE_OTHER): Payer: Self-pay

## 2024-02-27 DIAGNOSIS — M25521 Pain in right elbow: Secondary | ICD-10-CM

## 2024-02-27 DIAGNOSIS — G5621 Lesion of ulnar nerve, right upper limb: Secondary | ICD-10-CM

## 2024-02-27 NOTE — Progress Notes (Signed)
 Abigail Beasley - 53 y.o. female MRN [id number]  Date of birth: [birth year]  Office Visit Note: Visit Date: 02/27/2024 PCP: Paseda, Folashade R, FNP Referred by: Genelle Standing, MD  Subjective: No chief complaint on file.  HPI: Abigail Beasley is a pleasant 54 y.o. female who presents today for specific hand surgical evaluation as a referral from my partner Dr. Genelle for potential right-sided cubital tunnel and carpal tunnel syndrome.  She is describing symptoms emanating from the right elbow, numbness and tingling in the ulnar aspect of the hand.  Does have occasional radiating symptoms to the radial aspect of the hand as well with associated numbness and tingling.  Has not trialed any significant treatment modalities yet, did undergo recent EMG testing which indicated mild carpal tunnel syndrome, no significant electrodiagnostic findings of cubital tunnel syndrome.  Pertinent ROS were reviewed with the patient and found to be negative unless otherwise specified above in HPI.   Visit Reason: Right cubital tunnel, possible carpal tunnel Duration of symptoms: 2 months Hand dominance: right Occupation: housekeeping  Diabetic: Yes Smoking: No Heart/Lung History: none Blood Thinners:  none  Prior Testing/EMG: EMG Injections (Date): none Treatments: prednisone - helped Prior Surgery: none  Assessment & Plan: Visit Diagnoses:  1. Pain in right elbow   2. Cubital tunnel syndrome on right     Plan: Extensive discussion was had with the patient today regarding her right upper extremity complaints.  From a clinical standpoint, she is demonstrating signs symptoms consistent with cubital tunnel syndrome, however this does not correlate with her electrodiagnostic findings.  However, I did explain to her that the electrodiagnostic study is a static study, and her symptoms may be more mechanical in nature and related to a bent elbow position which was recreated today on clinical examination.  As  for the carpal tunnel syndrome seen on the electrodiagnostic study, she has mild evidence of carpal tunnel on clinical examination today.  We discussed at length the underlying etiology and pathophysiology of nerve compression of both the ulnar and median nerve at both the elbow and wrist level.  We discussed treat modalities ranging from conservative to surgical.  From a conservative standpoint we discussed activity modification, extension splinting at the elbow, therapeutic exercise, nerve glides as well as nonsteroidal anti-inflammatory medication both topical and oral.  From a surgical standpoint, I did discuss with her the possibility of nerve decompression at both the elbow and wrist level in the future should symptoms remain refractory to conservative care.  Understanding her options, she would like to begin with conservative measures which is appropriate.  Referral will be placed to occupational therapy today to begin nerve gliding exercises of both the elbow and wrist level, transition to home exercise program when appropriate.  She can return to me in approximate 6 to 8 weeks for recheck of her symptoms to see how she is responding to the therapeutic exercises.   I spent 45 minutes in the care of this patient today including review of previous documentation, imaging obtained, face-to-face time discussing all options regarding treatment and documenting the encounter.   Follow-up: No follow-ups on file.   Meds & Orders: No orders of the defined types were placed in this encounter.   Orders Placed This Encounter  Procedures   XR Elbow 2 Views Right   Ambulatory referral to Occupational Therapy     Procedures: No procedures performed      Clinical History: No specialty comments available.  She reports that she  has never smoked. She has never used smokeless tobacco.  Recent Labs    04/04/23 1143  HGBA1C 5.9*    Objective:   Vital Signs: LMP 10/17/2016 (Exact Date)   Physical  Exam  Gen: Well-appearing, in no acute distress; non-toxic CV: Regular Rate. Well-perfused. Warm.  Resp: Breathing unlabored on room air; no wheezing. Psych: Fluid speech in conversation; appropriate affect; normal thought process  Ortho Exam PHYSICAL EXAM:  General: Patient is well appearing and in no distress.   Skin and Muscle: No significant skin changes are apparent to upper extremities.   Range of Motion and Palpation Tests: Mobility is full about the elbows with flexion and extension.  No evidence of nerve subluxation at right elbow.  Forearm supination and pronation are 85/85 bilaterally.  Wrist flexion/extension is 75/65 bilaterally.  Digital flexion and extension are full.  Thumb opposition is full to the base of the small fingers bilaterally.    No cords or nodules are palpated.  No triggering is observed.     Neurologic, Vascular, Motor: Sensation is diminished to light touch in the right ulnar nerve distribution.    Tinel sign cubital tunnel: Positive right Scratch collapse elbow: Positive right  Tinel sign carpal tunnel: Negative right, mildly positive Phalen's right  Motor bilateral: - 5/5 APB without thenar atrophy - Small finger FDP 5/5 - Negative Froment's bilateral - Thumb opposition to the small finger DPC  Fingers pink and well perfused.  Capillary refill is brisk.     Lab Results  Component Value Date   HGBA1C 5.9 (A) 04/04/2023     Imaging: XR Elbow 2 Views Right Result Date: 02/27/2024 There is no evidence of fracture or dislocation.  Calcifications are visible at the medial aspect of the elbow at the level of the epicondyle.  Soft tissues are unremarkable.    Past Medical/Family/Surgical/Social History: Medications & Allergies reviewed per EMR, new medications updated. Patient Active Problem List   Diagnosis Date Noted   Hot flashes 02/12/2024   CKD stage 3a, GFR 45-59 ml/min (HCC) 02/12/2024   Chronic pain 02/12/2024   Chronic toe  pain, right foot 02/12/2024   Constipation 02/12/2024   Right hand pain 11/17/2023   Right arm pain 11/17/2023   Chronic right-sided low back pain with right-sided sciatica 10/10/2023   Pain of right lower leg 10/10/2023   Allergic rhinitis due to allergen 10/10/2023   Prediabetes 07/04/2023   Anxiety and depression 07/04/2023   Bilateral hand pain 07/04/2023   Neuropathy 07/04/2023   Colon cancer screening 02/28/2023   Trichomonas vaginitis 08/15/2020   Status post laparoscopic hysterectomy 11/08/2016   Severe obesity (BMI >= 40) (HCC) 07/28/2014   Dizziness and giddiness 03/18/2013   Essential hypertension, benign 03/18/2013   Breast pain, left 03/18/2013   Past Medical History:  Diagnosis Date   Allergy    Anxiety    Elevated serum creatinine 01/2019   History of proteinuria syndrome 01/2019   Hypertension    Left flank pain 01/2019   PONV (postoperative nausea and vomiting)    Family History  Problem Relation Age of Onset   Diabetes Mother    Hypertension Mother    Cancer Mother        liver    Hyperlipidemia Mother    Stroke Mother    Hypertension Sister    Diabetes Sister    Diabetes Maternal Uncle    Colon polyps Paternal Uncle    Colon cancer Paternal Uncle    Diabetes Maternal Grandmother  Hypertension Maternal Grandmother    Hyperlipidemia Maternal Grandmother    Heart disease Maternal Grandmother    Colon polyps Maternal Grandfather    Cancer Maternal Grandfather        prostate   Diabetes Maternal Grandfather    Hyperlipidemia Maternal Grandfather    Hypertension Maternal Grandfather    Colon cancer Maternal Grandfather    Breast cancer Neg Hx    Esophageal cancer Neg Hx    Rectal cancer Neg Hx    Stomach cancer Neg Hx    Past Surgical History:  Procedure Laterality Date   ABDOMINAL HYSTERECTOMY     COLONOSCOPY WITH PROPOFOL  N/A 02/28/2023   Procedure: COLONOSCOPY WITH PROPOFOL ;  Surgeon: Federico Rosario BROCKS, MD;  Location: [facility];  Service:  Gastroenterology;  Laterality: N/A;   CYSTOSCOPY N/A 11/08/2016   Procedure: CYSTOSCOPY;  Surgeon: Jannis Kate Norris, MD;  Location: [facility];  Service: Gynecology;  Laterality: N/A;   DILATION AND EVACUATION     miscarriage   TOTAL LAPAROSCOPIC HYSTERECTOMY WITH SALPINGECTOMY Bilateral 11/08/2016   Procedure: HYSTERECTOMY TOTAL LAPAROSCOPIC WITH BILATERAL SALPINGECTOMY;  Surgeon: Jannis Kate Norris, MD;  Location: [facility];  Service: Gynecology;  Laterality: Bilateral;   TUBAL LIGATION     Social History   Occupational History   Not on file  Tobacco Use   Smoking status: Never   Smokeless tobacco: Never  Vaping Use   Vaping status: Never Used  Substance and Sexual Activity   Alcohol use: Yes    Alcohol/week: 3.0 standard drinks of alcohol    Types: 3 Standard drinks or equivalent per week    Comment: occ   Drug use: Not Currently    Types: Marijuana    Comment: occ   Sexual activity: Not Currently    Partners: Male    Birth control/protection: Surgical    Nicollette Wilhelmi Estela) Arlinda, M.D. [facility], Hand Surgery

## 2024-03-05 ENCOUNTER — Other Ambulatory Visit (HOSPITAL_BASED_OUTPATIENT_CLINIC_OR_DEPARTMENT_OTHER): Payer: Self-pay | Admitting: Orthopaedic Surgery

## 2024-03-05 ENCOUNTER — Other Ambulatory Visit: Payer: Self-pay | Admitting: Nurse Practitioner

## 2024-03-05 DIAGNOSIS — M79641 Pain in right hand: Secondary | ICD-10-CM

## 2024-03-05 DIAGNOSIS — M79661 Pain in right lower leg: Secondary | ICD-10-CM

## 2024-03-05 DIAGNOSIS — M79605 Pain in left leg: Secondary | ICD-10-CM

## 2024-03-05 NOTE — Telephone Encounter (Signed)
 Please advise [facility]

## 2024-03-12 ENCOUNTER — Other Ambulatory Visit: Payer: Self-pay | Admitting: Nurse Practitioner

## 2024-03-12 ENCOUNTER — Encounter: Payer: Self-pay | Admitting: Rehabilitative and Restorative Service Providers"

## 2024-03-12 DIAGNOSIS — G629 Polyneuropathy, unspecified: Secondary | ICD-10-CM

## 2024-03-25 ENCOUNTER — Ambulatory Visit: Payer: Self-pay | Admitting: Nurse Practitioner

## 2024-03-28 NOTE — Therapy (Incomplete)
 OUTPATIENT OCCUPATIONAL THERAPY ORTHO EVALUATION  Patient Name: Abigail Beasley MRN: [id number] DOB:[birth year], 53 y.o., female Today's Date: 03/28/2024  PCP: Juanice MANO FNP REFERRING PROVIDER:  Arlinda Buster, MD    END OF SESSION:   Past Medical History:  Diagnosis Date   Allergy    Anxiety    Elevated serum creatinine 01/2019   History of proteinuria syndrome 01/2019   Hypertension    Left flank pain 01/2019   PONV (postoperative nausea and vomiting)    Past Surgical History:  Procedure Laterality Date   ABDOMINAL HYSTERECTOMY     COLONOSCOPY WITH PROPOFOL  N/A 02/28/2023   Procedure: COLONOSCOPY WITH PROPOFOL ;  Surgeon: Federico Rosario JAYSON, MD;  Location: [facility];  Service: Gastroenterology;  Laterality: N/A;   CYSTOSCOPY N/A 11/08/2016   Procedure: CYSTOSCOPY;  Surgeon: Jannis Kate Norris, MD;  Location: [facility];  Service: Gynecology;  Laterality: N/A;   DILATION AND EVACUATION     miscarriage   TOTAL LAPAROSCOPIC HYSTERECTOMY WITH SALPINGECTOMY Bilateral 11/08/2016   Procedure: HYSTERECTOMY TOTAL LAPAROSCOPIC WITH BILATERAL SALPINGECTOMY;  Surgeon: Jannis Kate Norris, MD;  Location: [facility];  Service: Gynecology;  Laterality: Bilateral;   TUBAL LIGATION     Patient Active Problem List   Diagnosis Date Noted   Hot flashes 02/12/2024   CKD stage 3a, GFR 45-59 ml/min (HCC) 02/12/2024   Chronic pain 02/12/2024   Chronic toe pain, right foot 02/12/2024   Constipation 02/12/2024   Right hand pain 11/17/2023   Right arm pain 11/17/2023   Chronic right-sided low back pain with right-sided sciatica 10/10/2023   Pain of right lower leg 10/10/2023   Allergic rhinitis due to allergen 10/10/2023   Prediabetes 07/04/2023   Anxiety and depression 07/04/2023   Bilateral hand pain 07/04/2023   Neuropathy 07/04/2023   Colon cancer screening 02/28/2023   Trichomonas vaginitis 08/15/2020   Status post laparoscopic hysterectomy 11/08/2016   Severe obesity (BMI >= 40) (HCC)  07/28/2014   Dizziness and giddiness 03/18/2013   Essential hypertension, benign 03/18/2013   Breast pain, left 03/18/2013    ONSET DATE: ***  REFERRING DIAG:  M25.521 (ICD-10-CM) - Pain in right elbow  G56.21 (ICD-10-CM) - Cubital tunnel syndrome on right    THERAPY DIAG:  No diagnosis found.  Rationale for Evaluation and Treatment: Rehabilitation  SUBJECTIVE:   SUBJECTIVE STATEMENT: She states ***.   PERTINENT HISTORY: ***  PRECAUTIONS: {Therapy precautions:24002}  RED FLAGS: {PT Red Flags:29287}   WEIGHT BEARING RESTRICTIONS: {Yes ***/No:24003}  PAIN:  Are you having pain? Yes: NPRS scale: *** Pain location: *** Pain description: *** Aggravating factors: *** Relieving factors: ***  FALLS: Has patient fallen in last 6 months? {fallsyesno:27318}  LIVING ENVIRONMENT: Lives with: {OPRC lives with:25569::lives with their family} Lives in: {Lives in:25570} Stairs: {opstairs:27293} Has following equipment at home: {Assistive devices:23999}  PLOF: {PLOF:24004}  PATIENT GOALS: ***  NEXT MD VISIT: ***   OBJECTIVE: (All objective assessments below are from initial evaluation on: 03/29/24 unless otherwise specified.)   HAND DOMINANCE: Right ***  ADLs: Overall ADLs: States decreased ability to grab, hold household objects, pain and difficulty to open containers, perform FMS tasks (manipulate fasteners on clothing), mild to moderate bathing problems as well. ***   FUNCTIONAL OUTCOME MEASURES: Eval: Patient Specific Functional Scale: *** (***, ***, ***)  (Higher Score  =  Better Ability for the Selected Tasks)      UPPER EXTREMITY ROM     Shoulder to Wrist AROM Right eval Left eval  Shoulder flexion  Shoulder abduction    Shoulder extension    Shoulder internal rotation    Shoulder external rotation    Elbow flexion    Elbow extension    Forearm supination    Forearm pronation     Wrist flexion    Wrist extension    Wrist ulnar deviation     Wrist radial deviation    Functional dart thrower's motion (F-DTM) in ulnar flexion    F-DTM in radial extension     (Blank rows = not tested)   Hand AROM Right eval  Full Fist Ability (or Gap to Distal Palmar Crease)   Thumb Opposition  (Kapandji Scale)    (Blank rows = not tested)   UPPER EXTREMITY MMT:     MMT Right 03/29/24  Shoulder flexion   Shoulder abduction   Shoulder adduction   Shoulder extension   Shoulder internal rotation   Shoulder external rotation   Middle trapezius   Lower trapezius   Elbow flexion   Elbow extension   Forearm supination   Forearm pronation   Wrist flexion   Wrist extension   Wrist ulnar deviation   Wrist radial deviation   (Blank rows = not tested)  HAND FUNCTION: Eval: Observed weakness in affected *** hand.  Grip strength Right: *** lbs, Left: *** lbs   COORDINATION: Eval: Observed coordination impairments with affected *** hand. Box and Blocks Test: *** Blocks today (*** is [facility]); 9 Hole Peg Test Right: ***sec, Left: *** sec (*** sec is WFL)   SENSATION: Eval:  Light touch intact today,   *** though diminished around sx area    EDEMA:   Eval: N/A  COGNITION: Eval: Overall cognitive status: WFL for evaluation today ***  OBSERVATIONS:   Eval: ***   TODAY'S TREATMENT:  Post-evaluation treatment:   She was given the following home exercise program to help manage paresthesia and pain.  She was also given self-care/safety information on avoiding nerve impingement and negative habits and postures that can contribute to paresthesia.  She states understanding all directions and leaves in no increase of pain.  ***   PATIENT EDUCATION: Education details: See tx section above for details  Person educated: Patient Education method: Verbal Instruction, Teach back, Handouts  Education comprehension: States and demonstrates understanding, Additional Education required    HOME EXERCISE PROGRAM: See tx section above for  details    GOALS: Goals reviewed with patient? Yes   SHORT TERM GOALS: (STG required if POC>30 days) Target Date: ***  Pt will obtain protective, custom orthotic. Goal status: TBD/PRN,  MET ***  2.  Pt will demo/state understanding of initial HEP to improve pain levels and prerequisite motion. Goal status: INITIAL   LONG TERM GOALS: Target Date: ***  Pt will improve functional ability by decreased impairment per PSFS assessment from *** to *** or better, for better quality of life. Goal status: INITIAL  2.  Pt will improve grip strength in *** hand from ***lbs to at least ***lbs for functional use at home and in IADLs. Goal status: INITIAL  3.  Pt will improve A/ROM in *** from *** to at least ***, to have functional motion for tasks like reach and grasp.  Goal status: INITIAL  4.  Pt will improve strength in *** from *** MMT to at least *** MMT to have increased functional ability to carry out selfcare and higher-level homecare tasks with less difficulty. Goal status: INITIAL  5.  Pt will improve coordination skills in ***, as  seen by within functional limit score on *** testing to have increased functional ability to carry out fine motor tasks (fasteners, etc.) and more complex, coordinated IADLs (meal prep, sports, etc.).  Goal status: INITIAL  6.  Pt will decrease pain at worst from ***/10 to ***/10 or better to have better sleep and occupational participation in daily roles. Goal status: INITIAL   ASSESSMENT:  CLINICAL IMPRESSION: Patient is a 53 y.o. female who was seen today for occupational therapy evaluation for ***.  The patient will benefit from outpatient occupational therapy to decrease symptoms, improve functional upper extremity use, and increase quality of life.  PERFORMANCE DEFICITS: in functional skills including ADLs, IADLs, sensation, ROM, strength, pain, fascial restrictions, Gross motor control, body mechanics, endurance, decreased knowledge of  precautions, and UE functional use, cognitive skills including problem solving and safety awareness, and psychosocial skills including coping strategies, environmental adaptation, habits, and routines and behaviors.   IMPAIRMENTS: are limiting patient from ADLs, IADLs, rest and sleep, and leisure.   COMORBIDITIES: may have co-morbidities  that affects occupational performance. Patient will benefit from skilled OT to address above impairments and improve overall function.  MODIFICATION OR ASSISTANCE TO COMPLETE EVALUATION: No modification of tasks or assist necessary to complete an evaluation.  OT OCCUPATIONAL PROFILE AND HISTORY: Problem focused assessment: Including review of records relating to presenting problem.  CLINICAL DECISION MAKING: Moderate - several treatment options, min-mod task modification necessary  REHAB POTENTIAL: Excellent  EVALUATION COMPLEXITY: Low      PLAN:  OT FREQUENCY: 1-2x/week  OT DURATION: *** weeks through *** and up to *** total visits as needed   PLANNED INTERVENTIONS: 97535 self care/ADL training, 02889 therapeutic exercise, 97530 therapeutic activity, 97112 neuromuscular re-education, 97140 manual therapy, 97035 ultrasound, [id number] electrical stimulation (manual), [id number] Orthotic Initial, [id number] Orthotic/Prosthetic subsequent, compression bandaging, Dry needling, energy conservation, coping strategies training, and patient/family education  RECOMMENDED OTHER SERVICES: none now    CONSULTED AND AGREED WITH PLAN OF CARE: Patient  PLAN FOR NEXT SESSION:   Review initial HEP and recommendations ***   Melvenia Ada, OTR/L, CHT  03/28/2024, 8:52 AM

## 2024-03-29 ENCOUNTER — Encounter: Payer: Self-pay | Admitting: Rehabilitative and Restorative Service Providers"

## 2024-04-01 ENCOUNTER — Encounter: Payer: Self-pay | Admitting: Radiology

## 2024-04-14 ENCOUNTER — Other Ambulatory Visit: Payer: Self-pay | Admitting: Nurse Practitioner

## 2024-04-14 DIAGNOSIS — G629 Polyneuropathy, unspecified: Secondary | ICD-10-CM

## 2024-04-16 ENCOUNTER — Telehealth: Payer: Self-pay

## 2024-04-16 ENCOUNTER — Ambulatory Visit: Payer: Self-pay | Admitting: Orthopedic Surgery

## 2024-04-16 NOTE — Telephone Encounter (Signed)
 Copied from CRM [id number]. Topic: General - Other >> Apr 16, 2024  3:50 PM Antwanette L wrote: Reason for CRM: The patient is calling to find out if she received a TB shot this year. If so, she is requesting a letter confirming that the vaccination was administered. The patient is requesting a callback at [phone].  Pt was advised where to look in her my chart for imm. KH

## 2024-04-16 NOTE — Progress Notes (Deleted)
 Follow up right elbow OT order placed 02/27/24

## 2024-04-17 NOTE — Telephone Encounter (Signed)
 The patient called back asking about when her last TB was done and getting a letter. She said she did look in her my chart and didn't see it. Please assist patient further.

## 2024-04-18 NOTE — Therapy (Incomplete)
 OUTPATIENT OCCUPATIONAL THERAPY ORTHO EVALUATION  Patient Name: Abigail Beasley MRN: [id number] DOB:[birth year], 53 y.o., female Today's Date: 04/18/2024  PCP: Juanice MANO FNP REFERRING PROVIDER:  Arlinda Buster, MD    END OF SESSION:   Past Medical History:  Diagnosis Date   Allergy    Anxiety    Elevated serum creatinine 01/2019   History of proteinuria syndrome 01/2019   Hypertension    Left flank pain 01/2019   PONV (postoperative nausea and vomiting)    Past Surgical History:  Procedure Laterality Date   ABDOMINAL HYSTERECTOMY     COLONOSCOPY WITH PROPOFOL  N/A 02/28/2023   Procedure: COLONOSCOPY WITH PROPOFOL ;  Surgeon: Federico Rosario JAYSON, MD;  Location: [facility];  Service: Gastroenterology;  Laterality: N/A;   CYSTOSCOPY N/A 11/08/2016   Procedure: CYSTOSCOPY;  Surgeon: Jannis Kate Norris, MD;  Location: [facility];  Service: Gynecology;  Laterality: N/A;   DILATION AND EVACUATION     miscarriage   TOTAL LAPAROSCOPIC HYSTERECTOMY WITH SALPINGECTOMY Bilateral 11/08/2016   Procedure: HYSTERECTOMY TOTAL LAPAROSCOPIC WITH BILATERAL SALPINGECTOMY;  Surgeon: Jannis Kate Norris, MD;  Location: [facility];  Service: Gynecology;  Laterality: Bilateral;   TUBAL LIGATION     Patient Active Problem List   Diagnosis Date Noted   Hot flashes 02/12/2024   CKD stage 3a, GFR 45-59 ml/min (HCC) 02/12/2024   Chronic pain 02/12/2024   Chronic toe pain, right foot 02/12/2024   Constipation 02/12/2024   Right hand pain 11/17/2023   Right arm pain 11/17/2023   Chronic right-sided low back pain with right-sided sciatica 10/10/2023   Pain of right lower leg 10/10/2023   Allergic rhinitis due to allergen 10/10/2023   Prediabetes 07/04/2023   Anxiety and depression 07/04/2023   Bilateral hand pain 07/04/2023   Neuropathy 07/04/2023   Colon cancer screening 02/28/2023   Trichomonas vaginitis 08/15/2020   Status post laparoscopic hysterectomy 11/08/2016   Severe obesity (BMI >= 40) (HCC)  07/28/2014   Dizziness and giddiness 03/18/2013   Essential hypertension, benign 03/18/2013   Breast pain, left 03/18/2013    ONSET DATE: ***  REFERRING DIAG:  M25.521 (ICD-10-CM) - Pain in right elbow  G56.21 (ICD-10-CM) - Cubital tunnel syndrome on right    THERAPY DIAG:  No diagnosis found.  Rationale for Evaluation and Treatment: Rehabilitation  SUBJECTIVE:   SUBJECTIVE STATEMENT: She states ***.   PERTINENT HISTORY: ***  PRECAUTIONS: {Therapy precautions:24002}  RED FLAGS: {PT Red Flags:29287}   WEIGHT BEARING RESTRICTIONS: {Yes ***/No:24003}  PAIN:  Are you having pain? Yes: NPRS scale: *** Pain location: *** Pain description: *** Aggravating factors: *** Relieving factors: ***  FALLS: Has patient fallen in last 6 months? {fallsyesno:27318}  LIVING ENVIRONMENT: Lives with: {[facility] lives with:25569::lives with their family} Lives in: {Lives in:25570} Stairs: {opstairs:27293} Has following equipment at home: {Assistive devices:23999}  PLOF: {PLOF:24004}  PATIENT GOALS: ***  NEXT MD VISIT: ***   OBJECTIVE: (All objective assessments below are from initial evaluation on: 04/22/24 unless otherwise specified.)   HAND DOMINANCE: Right ***  ADLs: Overall ADLs: States decreased ability to grab, hold household objects, pain and difficulty to open containers, perform FMS tasks (manipulate fasteners on clothing), mild to moderate bathing problems as well. ***   FUNCTIONAL OUTCOME MEASURES: Eval: Patient Specific Functional Scale: *** (***, ***, ***)  (Higher Score  =  Better Ability for the Selected Tasks)      UPPER EXTREMITY ROM     Shoulder to Wrist AROM Right eval Left eval  Shoulder flexion  Shoulder abduction    Shoulder extension    Shoulder internal rotation    Shoulder external rotation    Elbow flexion    Elbow extension    Forearm supination    Forearm pronation     Wrist flexion    Wrist extension    Wrist ulnar deviation     Wrist radial deviation    Functional dart thrower's motion (F-DTM) in ulnar flexion    F-DTM in radial extension     (Blank rows = not tested)   Hand AROM Right eval  Full Fist Ability (or Gap to Distal Palmar Crease)   Thumb Opposition  (Kapandji Scale)    (Blank rows = not tested)   UPPER EXTREMITY MMT:     MMT Right 03/29/24  Shoulder flexion   Shoulder abduction   Shoulder adduction   Shoulder extension   Shoulder internal rotation   Shoulder external rotation   Middle trapezius   Lower trapezius   Elbow flexion   Elbow extension   Forearm supination   Forearm pronation   Wrist flexion   Wrist extension   Wrist ulnar deviation   Wrist radial deviation   (Blank rows = not tested)  HAND FUNCTION: Eval: Observed weakness in affected *** hand.  Grip strength Right: *** lbs, Left: *** lbs   COORDINATION: Eval: Observed coordination impairments with affected *** hand. Box and Blocks Test: *** Blocks today (*** is [facility]); 9 Hole Peg Test Right: ***sec, Left: *** sec (*** sec is WFL)   SENSATION: Eval:  Light touch intact today,   *** though diminished around sx area    EDEMA:   Eval: N/A  COGNITION: Eval: Overall cognitive status: WFL for evaluation today ***  OBSERVATIONS:   Eval: ***   TODAY'S TREATMENT:  Post-evaluation treatment:   She was given the following home exercise program to help manage paresthesia and pain.  She was also given self-care/safety information on avoiding nerve impingement and negative habits and postures that can contribute to paresthesia.  She states understanding all directions and leaves in no increase of pain.  ***   PATIENT EDUCATION: Education details: See tx section above for details  Person educated: Patient Education method: Verbal Instruction, Teach back, Handouts  Education comprehension: States and demonstrates understanding, Additional Education required    HOME EXERCISE PROGRAM: See tx section above for  details    GOALS: Goals reviewed with patient? Yes   SHORT TERM GOALS: (STG required if POC>30 days) Target Date: ***  Pt will obtain protective, custom orthotic. Goal status: TBD/PRN,  MET ***  2.  Pt will demo/state understanding of initial HEP to improve pain levels and prerequisite motion. Goal status: INITIAL   LONG TERM GOALS: Target Date: ***  Pt will improve functional ability by decreased impairment per PSFS assessment from *** to *** or better, for better quality of life. Goal status: INITIAL  2.  Pt will improve grip strength in *** hand from ***lbs to at least ***lbs for functional use at home and in IADLs. Goal status: INITIAL  3.  Pt will improve A/ROM in *** from *** to at least ***, to have functional motion for tasks like reach and grasp.  Goal status: INITIAL  4.  Pt will improve strength in *** from *** MMT to at least *** MMT to have increased functional ability to carry out selfcare and higher-level homecare tasks with less difficulty. Goal status: INITIAL  5.  Pt will improve coordination skills in ***, as  seen by within functional limit score on *** testing to have increased functional ability to carry out fine motor tasks (fasteners, etc.) and more complex, coordinated IADLs (meal prep, sports, etc.).  Goal status: INITIAL  6.  Pt will decrease pain at worst from ***/10 to ***/10 or better to have better sleep and occupational participation in daily roles. Goal status: INITIAL   ASSESSMENT:  CLINICAL IMPRESSION: Patient is a 53 y.o. female who was seen today for occupational therapy evaluation for ***.  The patient will benefit from outpatient occupational therapy to decrease symptoms, improve functional upper extremity use, and increase quality of life.  PERFORMANCE DEFICITS: in functional skills including ADLs, IADLs, sensation, ROM, strength, pain, fascial restrictions, Gross motor control, body mechanics, endurance, decreased knowledge of  precautions, and UE functional use, cognitive skills including problem solving and safety awareness, and psychosocial skills including coping strategies, environmental adaptation, habits, and routines and behaviors.   IMPAIRMENTS: are limiting patient from ADLs, IADLs, rest and sleep, and leisure.   COMORBIDITIES: may have co-morbidities  that affects occupational performance. Patient will benefit from skilled OT to address above impairments and improve overall function.  MODIFICATION OR ASSISTANCE TO COMPLETE EVALUATION: No modification of tasks or assist necessary to complete an evaluation.  OT OCCUPATIONAL PROFILE AND HISTORY: Problem focused assessment: Including review of records relating to presenting problem.  CLINICAL DECISION MAKING: Moderate - several treatment options, min-mod task modification necessary  REHAB POTENTIAL: Excellent  EVALUATION COMPLEXITY: Low      PLAN:  OT FREQUENCY: 1-2x/week  OT DURATION: *** weeks through *** and up to *** total visits as needed   PLANNED INTERVENTIONS: 97535 self care/ADL training, 02889 therapeutic exercise, 97530 therapeutic activity, 97112 neuromuscular re-education, 97140 manual therapy, 97035 ultrasound, [id number] electrical stimulation (manual), [id number] Orthotic Initial, [id number] Orthotic/Prosthetic subsequent, compression bandaging, Dry needling, energy conservation, coping strategies training, and patient/family education  RECOMMENDED OTHER SERVICES: none now    CONSULTED AND AGREED WITH PLAN OF CARE: Patient  PLAN FOR NEXT SESSION:   Review initial HEP and recommendations ***   Melvenia Ada, OTR/L, CHT  04/18/2024, 8:56 AM

## 2024-04-22 ENCOUNTER — Encounter: Payer: Self-pay | Admitting: Rehabilitative and Restorative Service Providers"

## 2024-05-09 ENCOUNTER — Other Ambulatory Visit: Payer: Self-pay | Admitting: Nurse Practitioner

## 2024-05-09 DIAGNOSIS — M79605 Pain in left leg: Secondary | ICD-10-CM

## 2024-05-09 DIAGNOSIS — M79661 Pain in right lower leg: Secondary | ICD-10-CM

## 2024-05-09 DIAGNOSIS — M79641 Pain in right hand: Secondary | ICD-10-CM

## 2024-05-09 NOTE — Telephone Encounter (Signed)
 Please advise [facility]

## 2024-05-15 ENCOUNTER — Telehealth: Payer: Self-pay

## 2024-05-15 NOTE — Telephone Encounter (Signed)
 Called patient to make appointment and she reminded me to sent rx of:  gabapentin  (NEURONTIN ) 600 MG tablet [[id number]]

## 2024-05-16 ENCOUNTER — Other Ambulatory Visit: Payer: Self-pay

## 2024-05-16 DIAGNOSIS — G629 Polyneuropathy, unspecified: Secondary | ICD-10-CM

## 2024-05-16 NOTE — Telephone Encounter (Signed)
 Sent to the provider. KH

## 2024-05-16 NOTE — Telephone Encounter (Signed)
 Please advise [facility]

## 2024-05-17 MED ORDER — GABAPENTIN 600 MG PO TABS: 600.0000 mg | ORAL_TABLET | Freq: Two times a day (BID) | ORAL | 0 refills | Status: AC

## 2024-05-18 ENCOUNTER — Other Ambulatory Visit (HOSPITAL_BASED_OUTPATIENT_CLINIC_OR_DEPARTMENT_OTHER): Payer: Self-pay | Admitting: Orthopaedic Surgery

## 2024-05-28 ENCOUNTER — Ambulatory Visit: Payer: Self-pay

## 2024-05-28 NOTE — Telephone Encounter (Signed)
 Abigail Beasley

## 2024-05-28 NOTE — Telephone Encounter (Signed)
 FYI Only or Action Required?: FYI only for provider: ED advised.  Patient was last seen in primary care on 02/12/2024 by Paseda, Folashade R, FNP.  Called Nurse Triage reporting Flank Pain.  Symptoms began several days ago.  Interventions attempted: OTC medications: aleve  and Rest, hydration, or home remedies.  Symptoms are: rapidly worsening.  Triage Disposition: Go to ED Now (Notify PCP)  Patient/caregiver understands and will follow disposition?: Yes     Copied from CRM #[id number]. Topic: Clinical - Red Word Triage >> May 28, 2024  1:56 PM Montie POUR wrote: Red Word that prompted transfer to Nurse Triage:  She is having pain on left side lower back above kidney area. Her pain level is up to a 9 when moving area. It started 3 days ago and getting worse. Reason for Disposition  [1] SEVERE pain (e.g., excruciating, scale 8-10) AND [2] present > 1 hour  Answer Assessment - Initial Assessment Questions This RN recommended pt be examined at ED right away, pt verbalized understanding and intent to go. Advised pt call 911 if worsening or new symptoms.   Left side lower back near kidneys If move a certain way, trying to get up, like sharp knife poking at me, have to hold on to something just to get up When try to turn onto right side, hard time doing that because of the pain Cold/clammy skin at work, felt like was going to throw up, had to leave work today for that Not weak but just don't have the energy to do anything really No injury or hx of UTI Constant 9/10 Aleve  not worked No blood in urine No fever  Protocols used: Flank Pain-A-AH

## 2024-06-07 ENCOUNTER — Encounter: Payer: Self-pay | Admitting: Nurse Practitioner

## 2024-06-07 ENCOUNTER — Ambulatory Visit: Payer: Self-pay | Admitting: Nurse Practitioner

## 2024-06-07 VITALS — BP 162/88 | HR 73 | Wt 291.0 lb

## 2024-06-07 DIAGNOSIS — R7303 Prediabetes: Secondary | ICD-10-CM

## 2024-06-07 DIAGNOSIS — Z23 Encounter for immunization: Secondary | ICD-10-CM

## 2024-06-07 DIAGNOSIS — J309 Allergic rhinitis, unspecified: Secondary | ICD-10-CM

## 2024-06-07 DIAGNOSIS — G629 Polyneuropathy, unspecified: Secondary | ICD-10-CM

## 2024-06-07 DIAGNOSIS — Z0000 Encounter for general adult medical examination without abnormal findings: Secondary | ICD-10-CM | POA: Insufficient documentation

## 2024-06-07 DIAGNOSIS — N1831 Chronic kidney disease, stage 3a: Secondary | ICD-10-CM

## 2024-06-07 DIAGNOSIS — Z59819 Housing instability, housed unspecified: Secondary | ICD-10-CM

## 2024-06-07 DIAGNOSIS — Z1231 Encounter for screening mammogram for malignant neoplasm of breast: Secondary | ICD-10-CM | POA: Insufficient documentation

## 2024-06-07 DIAGNOSIS — Z5941 Food insecurity: Secondary | ICD-10-CM

## 2024-06-07 DIAGNOSIS — I10 Essential (primary) hypertension: Secondary | ICD-10-CM

## 2024-06-07 LAB — POCT GLYCOSYLATED HEMOGLOBIN (HGB A1C): Hemoglobin A1C: 5.8 % — AB (ref 4.0–5.6)

## 2024-06-07 MED ORDER — SALINE SPRAY 0.65 % NA SOLN: 1.0000 | NASAL | 0 refills | Status: AC | PRN

## 2024-06-07 MED ORDER — FLUTICASONE PROPIONATE 50 MCG/ACT NA SUSP: 2.0000 | Freq: Every day | NASAL | 0 refills | Status: AC

## 2024-06-07 MED ORDER — LOSARTAN POTASSIUM 25 MG PO TABS: 25.0000 mg | ORAL_TABLET | Freq: Every day | ORAL | 1 refills | Status: AC

## 2024-06-07 MED ORDER — CETIRIZINE HCL 10 MG PO TABS: 10.0000 mg | ORAL_TABLET | Freq: Every day | ORAL | 1 refills | Status: AC

## 2024-06-07 NOTE — Progress Notes (Signed)
 "  Established Patient Office Visit  Subjective:  Patient ID: Abigail Beasley, female    DOB: 01-Aug-1970  Age: 54 y.o. MRN: [id number]  CC:  Chief Complaint  Patient presents with   Hypertension    HPI   Discussed the use of AI scribe software for clinical note transcription with the patient, who gave verbal consent to proceed.  History of Present Illness Abigail Beasley is a 54 year old female  has a past medical history of Allergy, Anxiety, Elevated serum creatinine (01/2019), History of proteinuria syndrome (01/2019), Hypertension, Left flank pain (01/2019), and PONV (postoperative nausea and vomiting).  with hypertension and chronic kidney disease who presents for follow-up on her blood pressure and other medical conditions.  She is managing her hypertension with amlodipine  7.5 mg daily and hydrochlorothiazide  25 mg daily. She has not been taking losartan , which was previously prescribed. Her blood pressure readings have been elevated, with recent measurements of 159/99 mmHg and 162/88 mmHg. She has not been monitoring her blood pressure at home despite having a machine.  She occasionally uses meloxicam  7.5mg  as needed daily  for pain related to a broken toe, but only when the pain is severe.  She has prediabetes with a recent A1c of 5.8%. She wants to lose weight and reports eating twice a day. She is interested in weight loss options and has [employer]. She reports a lack of energy and difficulty with weight loss.  She experiences shortness of breath and some swelling. She has allergies for which she has been prescribed Flonase  nasal spray and Claritin , though she is not consistently using them. She experiences shortness of breath.  She reports a recent episode of pain starting in her back and moving to her left abdomen, which was severe enough to affect her ability to wash and lie on her right side. The pain resolved after a sensation of 'gargling bubbles' and has not  recurred.  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 She has expressed concerns about food insecurity.    Assessment & Plan     Past Medical History:  Diagnosis Date   Allergy    Anxiety    Elevated serum creatinine 01/2019   History of proteinuria syndrome 01/2019   Hypertension    Left flank pain 01/2019   PONV (postoperative nausea and vomiting)     Past Surgical History:  Procedure Laterality Date   ABDOMINAL HYSTERECTOMY     COLONOSCOPY WITH PROPOFOL  N/A 02/28/2023   Procedure: COLONOSCOPY WITH PROPOFOL ;  Surgeon: Federico Rosario JAYSON, MD;  Location: [facility];  Service: Gastroenterology;  Laterality: N/A;   CYSTOSCOPY N/A 11/08/2016   Procedure: CYSTOSCOPY;  Surgeon: Jannis Kate Norris, MD;  Location: [facility];  Service: Gynecology;  Laterality: N/A;   DILATION AND EVACUATION     miscarriage   TOTAL LAPAROSCOPIC HYSTERECTOMY WITH SALPINGECTOMY Bilateral 11/08/2016   Procedure: HYSTERECTOMY TOTAL LAPAROSCOPIC WITH BILATERAL SALPINGECTOMY;  Surgeon: Jannis Kate Norris, MD;  Location: [facility];  Service: Gynecology;  Laterality: Bilateral;   TUBAL LIGATION      Family History  Problem Relation Age of Onset   Diabetes Mother    Hypertension Mother    Cancer Mother        liver    Hyperlipidemia Mother    Stroke Mother    Hypertension Sister    Diabetes Sister    Diabetes Maternal Uncle    Colon polyps Paternal Uncle    Colon cancer Paternal Uncle  Diabetes Maternal Grandmother    Hypertension Maternal Grandmother    Hyperlipidemia Maternal Grandmother    Heart disease Maternal Grandmother    Colon polyps Maternal Grandfather    Cancer Maternal Grandfather        prostate   Diabetes Maternal Grandfather    Hyperlipidemia Maternal Grandfather    Hypertension Maternal Grandfather    Colon cancer Maternal Grandfather    Breast cancer Neg Hx    Esophageal cancer Neg Hx    Rectal cancer Neg Hx    Stomach cancer Neg Hx     Social History    Socioeconomic History   Marital status: Single    Spouse name: Not on file   Number of children: Not on file   Years of education: Not on file   Highest education level: GED or equivalent  Occupational History   Not on file  Tobacco Use   Smoking status: Never   Smokeless tobacco: Never  Vaping Use   Vaping status: Never Used  Substance and Sexual Activity   Alcohol use: Yes    Alcohol/week: 3.0 standard drinks of alcohol    Types: 3 Standard drinks or equivalent per week    Comment: occ   Drug use: Not Currently    Types: Marijuana    Comment: occ   Sexual activity: Not Currently    Partners: Male    Birth control/protection: Surgical  Other Topics Concern   Not on file  Social History Narrative   Marital status: single; not dating.      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      Tobacco: none      Alcohol:  Socially; rarely.      Drugs: none; marijuana in the past.      Exercise: none      History of incarceration for six years (guilt by association; friend robbed bank).      Sexual activity:  Males; last STD Gonorrhea at age 5.  Syphilis in twenties.    Social Drivers of Health   Tobacco Use: Low Risk (06/07/2024)   Patient History    Smoking Tobacco Use: Never    Smokeless Tobacco Use: Never    Passive Exposure: Not on file  Financial Resource Strain: High Risk (06/03/2024)   Overall Financial Resource Strain (CARDIA)    Difficulty of Paying Living Expenses: Hard  Food Insecurity: Food Insecurity Present (06/07/2024)   Epic    Worried About Programme Researcher, Broadcasting/film/video in the Last Year: Sometimes true    [employer] of Food in the Last Year: Often true  Transportation Needs: No Transportation Needs (06/07/2024)   Epic    Lack of Transportation (Medical): No    Lack of Transportation (Non-Medical): No  Physical Activity: Sufficiently Active (06/03/2024)   Exercise Vital Sign    Days  of Exercise per Week: 5 days    Minutes of Exercise per Session: 60 min  Stress: Stress Concern Present (06/03/2024)   [employer] of Occupational Health - Occupational Stress Questionnaire    Feeling of Stress: To some extent  Social Connections: Socially Isolated (06/03/2024)   Social Connection and Isolation Panel    Frequency of Communication with Friends and Family: More than three times a week    Frequency of Social Gatherings with Friends and Family: Twice a week    Attends Religious Services: Never  Active Member of Clubs or Organizations: No    Attends Banker Meetings: Not on file    Marital Status: Never married  Intimate Partner Violence: Not At Risk (06/07/2024)   Epic    Fear of Current or Ex-Partner: No    Emotionally Abused: No    Physically Abused: No    Sexually Abused: No  Depression (PHQ2-9): Low Risk (06/07/2024)   Depression (PHQ2-9)    PHQ-2 Score: 0  Alcohol Screen: Low Risk (06/03/2024)   Alcohol Screen    Last Alcohol Screening Score (AUDIT): 3  Housing: High Risk (06/07/2024)   Epic    Unable to Pay for Housing in the Last Year: Yes    Number of Times Moved in the Last Year: 0    Homeless in the Last Year: No  Utilities: Not At Risk (06/07/2024)   Epic    Threatened with loss of utilities: No  Health Literacy: Not on file    Outpatient Medications Prior to Visit  Medication Sig Dispense Refill   amLODipine  (NORVASC ) 5 MG tablet Take 1.5 tablets (7.5 mg total) by mouth daily. 90 tablet 1   dorzolamide-timolol (COSOPT) 2-0.5 % ophthalmic solution 1 drop 2 (two) times daily.     gabapentin  (NEURONTIN ) 600 MG tablet Take 1 tablet (600 mg total) by mouth 2 (two) times daily. 60 tablet 0   hydrochlorothiazide  (HYDRODIURIL ) 25 MG tablet Take 1 tablet (25 mg total) by mouth daily. 90 tablet 1   latanoprost (XALATAN) 0.005 % ophthalmic solution 1 drop at bedtime.     meloxicam  (MOBIC ) 7.5 MG tablet TAKE 1 TABLET BY MOUTH ONCE DAILY AS NEEDED WITH  FOOD FOR PAIN 10 tablet 0   Multiple Vitamin (MULTIVITAMIN WITH MINERALS) TABS tablet Take 1 tablet by mouth 4 (four) times a week.     cetirizine  (ZYRTEC ) 10 MG tablet Take 1 tablet by mouth daily.     Ferrous Sulfate (IRON PO) Take 1 tablet by mouth daily. (Patient not taking: Reported on 06/07/2024)     lidocaine  (LIDODERM ) 5 % USE 1 PATCH EXTERNALLY ONCE DAILY (REMOVE  &  DISCARD  PATCH  WITHIN  12  HOURS  OR  AS  DIRECTED  BY  MD) (Patient not taking: Reported on 06/07/2024) 30 patch 0   DULoxetine  (CYMBALTA ) 30 MG capsule Take 1 capsule (30 mg total) by mouth daily. (Patient not taking: Reported on 06/07/2024) 60 capsule 2   fluticasone  (FLONASE ) 50 MCG/ACT nasal spray Place 1 spray into the nose. (Patient not taking: Reported on 06/07/2024)     loratadine  (CLARITIN ) 10 MG tablet Take 1 tablet (10 mg total) by mouth daily. (Patient not taking: Reported on 06/07/2024) 30 tablet 11   losartan  (COZAAR ) 25 MG tablet Take 1 tablet (25 mg total) by mouth daily. (Patient not taking: Reported on 06/07/2024) 30 tablet 1   methylPREDNISolone  (MEDROL  DOSEPAK) 4 MG TBPK tablet TAKE BY MOUTH AS DIRECTED ON INSIDE OF PACKAGE (Patient not taking: Reported on 06/07/2024) 21 tablet 0   sodium chloride  (OCEAN) 0.65 % SOLN nasal spray Place 1 spray into both nostrils as needed. (Patient not taking: Reported on 06/07/2024) 30 mL 0   traZODone  (DESYREL ) 50 MG tablet Take 0.5 tablets (25 mg total) by mouth at bedtime as needed for sleep. (Patient not taking: Reported on 06/07/2024) 20 tablet 3   No facility-administered medications prior to visit.    Allergies[1]  ROS Review of Systems  Constitutional:  Negative for appetite change, chills, fatigue and fever.  HENT:  Negative for congestion, postnasal drip, rhinorrhea and sneezing.   Respiratory:  Positive for shortness of breath. Negative for cough and wheezing.   Cardiovascular:  Negative for chest pain, palpitations and leg swelling.  Gastrointestinal:  Negative for  abdominal pain, constipation, nausea and vomiting.  Genitourinary:  Negative for difficulty urinating, dysuria, flank pain and frequency.  Musculoskeletal:  Positive for arthralgias. Negative for back pain, joint swelling and myalgias.  Skin:  Negative for color change, pallor, rash and wound.  Neurological:  Negative for dizziness, facial asymmetry, weakness, numbness and headaches.  Psychiatric/Behavioral:  Negative for behavioral problems, confusion, self-injury and suicidal ideas.       Objective:    Physical Exam Vitals and nursing note reviewed.  Constitutional:      General: She is not in acute distress.    Appearance: Normal appearance. She is obese. She is not ill-appearing, toxic-appearing or diaphoretic.  HENT:     Nose: Congestion present.     Right Turbinates: Enlarged.     Left Turbinates: Enlarged.     Mouth/Throat:     Mouth: Mucous membranes are moist.     Pharynx: Oropharynx is clear. No oropharyngeal exudate or posterior oropharyngeal erythema.  Eyes:     General: No scleral icterus.       Right eye: No discharge.        Left eye: No discharge.     Extraocular Movements: Extraocular movements intact.     Conjunctiva/sclera: Conjunctivae normal.  Cardiovascular:     Rate and Rhythm: Normal rate and regular rhythm.     Pulses: Normal pulses.     Heart sounds: Normal heart sounds. No murmur heard.    No friction rub. No gallop.  Pulmonary:     Effort: Pulmonary effort is normal. No respiratory distress.     Breath sounds: Normal breath sounds. No stridor. No wheezing, rhonchi or rales.  Chest:     Chest wall: No tenderness.  Abdominal:     General: There is no distension.     Palpations: Abdomen is soft.     Tenderness: There is no abdominal tenderness. There is no right CVA tenderness, left CVA tenderness or guarding.  Musculoskeletal:        General: No swelling, tenderness, deformity or signs of injury.     Right lower leg: No edema.     Left lower  leg: No edema.  Skin:    General: Skin is warm and dry.     Capillary Refill: Capillary refill takes less than 2 seconds.     Coloration: Skin is not jaundiced or pale.     Findings: No bruising, erythema or lesion.  Neurological:     Mental Status: She is alert and oriented to person, place, and time.     Motor: No weakness.     Coordination: Coordination normal.     Gait: Gait normal.  Psychiatric:        Mood and Affect: Mood normal.        Behavior: Behavior normal.        Thought Content: Thought content normal.        Judgment: Judgment normal.     BP (!) 162/88   Pulse 73   Wt 291 lb (132 kg)   LMP 10/17/2016   SpO2 100%   BMI 45.58 kg/m  Wt Readings from Last 3 Encounters:  06/07/24 291 lb (132 kg)  02/12/24 286 lb (129.7 kg)  11/17/23 294 lb (133.4 kg)  Lab Results  Component Value Date   TSH 1.750 04/04/2023   Lab Results  Component Value Date   WBC 6.6 03/14/2023   HGB 14.2 03/14/2023   HCT 42.4 03/14/2023   MCV 90.4 03/14/2023   PLT 279 03/14/2023   Lab Results  Component Value Date   NA 142 10/10/2023   K 4.0 10/10/2023   CO2 20 10/10/2023   GLUCOSE 89 10/10/2023   BUN 23 10/10/2023   CREATININE 1.29 (H) 10/10/2023   BILITOT <0.2 10/10/2023   ALKPHOS 95 10/10/2023   AST 13 10/10/2023   ALT 13 10/10/2023   PROT 6.8 10/10/2023   ALBUMIN 4.1 10/10/2023   CALCIUM 9.5 10/10/2023   ANIONGAP 9 03/14/2023   EGFR 49 (L) 10/10/2023   Lab Results  Component Value Date   CHOL 170 08/11/2020   Lab Results  Component Value Date   HDL 61 08/11/2020   Lab Results  Component Value Date   LDLCALC 94 08/11/2020   Lab Results  Component Value Date   TRIG 83 08/11/2020   Lab Results  Component Value Date   CHOLHDL 2.8 08/11/2020   Lab Results  Component Value Date   HGBA1C 5.9 (A) 04/04/2023      Assessment & Plan:   Problem List Items Addressed This Visit       Cardiovascular and Mediastinum   Essential hypertension, benign -  Primary       06/07/2024   11:07 AM 06/07/2024   10:55 AM 02/12/2024   10:53 AM 02/12/2024    9:59 AM 11/17/2023    3:10 PM 11/17/2023    2:58 PM 10/10/2023   10:53 AM  BP/Weight  Systolic BP 162 159 147 132 162 148 135  Diastolic BP 88 99 74 81 89 89 76  Wt. (Lbs)  291  286  294   BMI  45.58 kg/m2  44.79 kg/m2  46.05 kg/m2    Essential hypertension with chronic kidney disease stage 3a Blood pressure elevated. Non-adherence to losartan . CKD stage . Uncontrolled hypertension risks further kidney damage. Meloxicam  discouraged due to nephrotoxicity. - Ordered labs for kidney function and electrolytes. - Initiate losartan  25 mg daily if kidney function stable. - Encouraged home blood pressure monitoring and recording. - Advised heart-healthy, low-salt, low-fat diet and regular exercise. - Minimize meloxicam  use.        Relevant Medications   losartan  (COZAAR ) 25 MG tablet   Other Relevant Orders   CBC   Basic Metabolic Panel     Respiratory   Allergic rhinitis due to allergen   Allergic rhinitis Nasal congestion and shortness of breath. Inconsistent use of Flonase  and cetirizine . - Encouraged regular use of Flonase  and Claritin .        Relevant Medications   fluticasone  (FLONASE ) 50 MCG/ACT nasal spray   cetirizine  (ZYRTEC ) 10 MG tablet   sodium chloride  (OCEAN) 0.65 % SOLN nasal spray     Nervous and Auditory   Neuropathy   Chronic pain and neuropathy Chronic pain managed with gabapentin . Meloxicam  used sparingly. Reports foot pain from broken toe. - Continue gabapentin  600 mg twice daily. - Minimize meloxicam  use.         Genitourinary   CKD stage 3a, GFR 45-59 ml/min (HCC)   Avoid NSAIDs and other nephrotoxic agents Maintain hydration Lab Results  Component Value Date   NA 142 10/10/2023   K 4.0 10/10/2023   CO2 20 10/10/2023   GLUCOSE 89 10/10/2023   BUN 23 10/10/2023   CREATININE  1.29 (H) 10/10/2023   CALCIUM 9.5 10/10/2023   EGFR 49 (L) 10/10/2023    GFRNONAA 58 (L) 03/14/2023         Relevant Medications   losartan  (COZAAR ) 25 MG tablet   Other Relevant Orders   CMP14+EGFR     Other   Morbid obesity (HCC)   Wt Readings from Last 3 Encounters:  06/07/24 291 lb (132 kg)  02/12/24 286 lb (129.7 kg)  11/17/23 294 lb (133.4 kg)   Body mass index is 45.58 kg/m.  Severe obesity (BMI = 40) Severe obesity with reported low energy and weight loss difficulty. Phentermine contraindicated due to hypertension. Emphasized lifestyle modifications. - Referred to medical weight management clinic. - Encouraged 30 minutes moderate to vigorous exercise, 5 days a week. - Advised dietary modifications: 70-80% vegetables and protein, reduced carbohydrates, water  instead of soda, portion control.       Prediabetes   Lab Results  Component Value Date   HGBA1C 5.9 (A) 04/04/2023    A1c 5.8 indicates prediabetes. Emphasized lifestyle changes to prevent diabetes progression. - Encouraged weight loss through diet and exercise. - Advised to avoid sugar and sweetened sodas.        Need for influenza vaccination   Screening mammogram for breast cancer   Relevant Orders   MM 3D SCREENING MAMMOGRAM BILATERAL BREAST   Health care maintenance     General health maintenance Due for flu shot and mammogram. Colonoscopy last in 2024. - Administered flu shot. - Ordered mammogram. - Reviewed colonoscopy records for next screening interval.      Food insecurity   Food insecurity Difficulty affording food and rent. Has not applied for food stamps. - Provided information on local food pantry.      Housing instability   Sometimes has difficulty with pain heart rates Find help resources provided       Meds ordered this encounter  Medications   fluticasone  (FLONASE ) 50 MCG/ACT nasal spray    Sig: Place 2 sprays into both nostrils daily.    Dispense:  11.1 mL    Refill:  0   cetirizine  (ZYRTEC ) 10 MG tablet    Sig: Take 1 tablet (10 mg  total) by mouth daily.    Dispense:  90 tablet    Refill:  1   sodium chloride  (OCEAN) 0.65 % SOLN nasal spray    Sig: Place 1 spray into both nostrils as needed.    Dispense:  30 mL    Refill:  0   losartan  (COZAAR ) 25 MG tablet    Sig: Take 1 tablet (25 mg total) by mouth daily.    Dispense:  60 tablet    Refill:  1    Follow-up: Return in about 4 weeks (around 07/05/2024) for HTN.    Dorcas Melito R Monigue Spraggins, FNP     [1]  Allergies Allergen Reactions   Lisinopril  Cough   "

## 2024-06-07 NOTE — Addendum Note (Signed)
 Addended by: VICTORY IHA on: 06/07/2024 04:25 PM   Modules accepted: Orders

## 2024-06-07 NOTE — Assessment & Plan Note (Addendum)
 Wt Readings from Last 3 Encounters:  06/07/24 291 lb (132 kg)  02/12/24 286 lb (129.7 kg)  11/17/23 294 lb (133.4 kg)   Body mass index is 45.58 kg/m.  Severe obesity (BMI = 40) Severe obesity with reported low energy and weight loss difficulty. Phentermine contraindicated due to hypertension. Emphasized lifestyle modifications. - Referred to medical weight management clinic. - Encouraged 30 minutes moderate to vigorous exercise, 5 days a week. - Advised dietary modifications: 70-80% vegetables and protein, reduced carbohydrates, water  instead of soda, portion control.

## 2024-06-07 NOTE — Patient Instructions (Addendum)
 For the swelling in your lower extremities, be sure to elevate your legs when able, mind the salt intake, stay physically active and consider wearing compression stockings.    Around 3 times per week, check your blood pressure 2 times per day. once in the morning and once in the evening. The readings should be at least one minute apart. Write down these values and bring them to your next nurse visit/appointment.  When you check your BP, make sure you have been doing something calm/relaxing 5 minutes prior to checking. Both feet should be flat on the floor and you should be sitting. Use your left arm and make sure it is in a relaxed position (on a table), and that the cuff is at the approximate level/height of your heart.    It is important that you exercise regularly at least 30 minutes 5 times a week as tolerated  Think about what you will eat, plan ahead. Choose  clean, green, fresh or frozen over canned, processed or packaged foods which are more sugary, salty and fatty. 70 to 75% of food eaten should be vegetables and fruit. Three meals at set times with snacks allowed between meals, but they must be fruit or vegetables. Aim to eat over a 12 hour period , example 7 am to 7 pm, and STOP after  your last meal of the day. Drink water ,generally about 64 ounces per day, no other drink is as healthy. Fruit juice is best enjoyed in a healthy way, by EATING the fruit.  Thanks for choosing [facility] we consider it a privelige to serve you.

## 2024-06-07 NOTE — Assessment & Plan Note (Signed)
 Avoid NSAIDs and other nephrotoxic agents Maintain hydration Lab Results  Component Value Date   NA 142 10/10/2023   K 4.0 10/10/2023   CO2 20 10/10/2023   GLUCOSE 89 10/10/2023   BUN 23 10/10/2023   CREATININE 1.29 (H) 10/10/2023   CALCIUM 9.5 10/10/2023   EGFR 49 (L) 10/10/2023   GFRNONAA 58 (L) 03/14/2023

## 2024-06-07 NOTE — Assessment & Plan Note (Signed)
 Allergic rhinitis Nasal congestion and shortness of breath. Inconsistent use of Flonase  and cetirizine . - Encouraged regular use of Flonase  and Claritin .

## 2024-06-07 NOTE — Assessment & Plan Note (Signed)
" ° °    06/07/2024   11:07 AM 06/07/2024   10:55 AM 02/12/2024   10:53 AM 02/12/2024    9:59 AM 11/17/2023    3:10 PM 11/17/2023    2:58 PM 10/10/2023   10:53 AM  BP/Weight  Systolic BP 162 159 147 132 162 148 135  Diastolic BP 88 99 74 81 89 89 76  Wt. (Lbs)  291  286  294   BMI  45.58 kg/m2  44.79 kg/m2  46.05 kg/m2    Essential hypertension with chronic kidney disease stage 3a Blood pressure elevated. Non-adherence to losartan . CKD stage . Uncontrolled hypertension risks further kidney damage. Meloxicam  discouraged due to nephrotoxicity. - Ordered labs for kidney function and electrolytes. - Initiate losartan  25 mg daily if kidney function stable. - Encouraged home blood pressure monitoring and recording. - Advised heart-healthy, low-salt, low-fat diet and regular exercise. - Minimize meloxicam  use.   "

## 2024-06-07 NOTE — Assessment & Plan Note (Signed)
 Lab Results  Component Value Date   HGBA1C 5.9 (A) 04/04/2023    A1c 5.8 indicates prediabetes. Emphasized lifestyle changes to prevent diabetes progression. - Encouraged weight loss through diet and exercise. - Advised to avoid sugar and sweetened sodas.

## 2024-06-07 NOTE — Assessment & Plan Note (Signed)
 Food insecurity Difficulty affording food and rent. Has not applied for food stamps. - Provided information on local food pantry.

## 2024-06-07 NOTE — Assessment & Plan Note (Signed)
 Chronic pain and neuropathy Chronic pain managed with gabapentin . Meloxicam  used sparingly. Reports foot pain from broken toe. - Continue gabapentin  600 mg twice daily. - Minimize meloxicam  use.

## 2024-06-07 NOTE — Assessment & Plan Note (Signed)
 Sometimes has difficulty with pain heart rates Find help resources provided

## 2024-06-07 NOTE — Assessment & Plan Note (Signed)
" ° °  General health maintenance Due for flu shot and mammogram. Colonoscopy last in 2024. - Administered flu shot. - Ordered mammogram. - Reviewed colonoscopy records for next screening interval. "

## 2024-06-08 ENCOUNTER — Other Ambulatory Visit: Payer: Self-pay | Admitting: Nurse Practitioner

## 2024-06-08 ENCOUNTER — Ambulatory Visit: Payer: Self-pay | Admitting: Nurse Practitioner

## 2024-06-08 DIAGNOSIS — I10 Essential (primary) hypertension: Secondary | ICD-10-CM

## 2024-06-08 LAB — CMP14+EGFR
ALT: 14 IU/L (ref 0–32)
AST: 15 IU/L (ref 0–40)
Albumin: 4.4 g/dL (ref 3.8–4.9)
Alkaline Phosphatase: 93 IU/L (ref 49–135)
BUN/Creatinine Ratio: 19 (ref 9–23)
BUN: 24 mg/dL (ref 6–24)
Bilirubin Total: <0.2 mg/dL (ref 0.0–1.2)
CO2: 23 mmol/L (ref 20–29)
Calcium: 10 mg/dL (ref 8.7–10.2)
Chloride: 105 mmol/L (ref 96–106)
Creatinine, Ser: 1.27 mg/dL — ABNORMAL HIGH (ref 0.57–1.00)
Globulin, Total: 2.6 g/dL (ref 1.5–4.5)
Glucose: 87 mg/dL (ref 70–99)
Potassium: 4.1 mmol/L (ref 3.5–5.2)
Sodium: 143 mmol/L (ref 134–144)
Total Protein: 7 g/dL (ref 6.0–8.5)
eGFR: 50 mL/min/1.73 — ABNORMAL LOW

## 2024-06-08 LAB — CBC
Hematocrit: 44.6 % (ref 34.0–46.6)
Hemoglobin: 14.5 g/dL (ref 11.1–15.9)
MCH: 29.8 pg (ref 26.6–33.0)
MCHC: 32.5 g/dL (ref 31.5–35.7)
MCV: 92 fL (ref 79–97)
Platelets: 285 x10E3/uL (ref 150–450)
RBC: 4.86 x10E6/uL (ref 3.77–5.28)
RDW: 12.2 % (ref 11.7–15.4)
WBC: 5.7 x10E3/uL (ref 3.4–10.8)

## 2024-06-08 MED ORDER — HYDROCHLOROTHIAZIDE 25 MG PO TABS: 25.0000 mg | ORAL_TABLET | Freq: Every day | ORAL | 1 refills | Status: AC

## 2024-06-08 MED ORDER — AMLODIPINE BESYLATE 5 MG PO TABS: 7.5000 mg | ORAL_TABLET | Freq: Every day | ORAL | 1 refills | Status: AC

## 2024-06-10 ENCOUNTER — Telehealth: Payer: Self-pay

## 2024-06-10 NOTE — Telephone Encounter (Signed)
 Patient telephoned and requested mammogram scholarship application. Confirmed patient's address.

## 2024-06-21 ENCOUNTER — Other Ambulatory Visit: Payer: Self-pay

## 2024-06-21 ENCOUNTER — Encounter: Payer: Self-pay | Admitting: Nurse Practitioner

## 2024-07-04 ENCOUNTER — Other Ambulatory Visit (HOSPITAL_BASED_OUTPATIENT_CLINIC_OR_DEPARTMENT_OTHER): Payer: Self-pay | Admitting: Orthopaedic Surgery

## 2024-07-04 ENCOUNTER — Other Ambulatory Visit: Payer: Self-pay | Admitting: Nurse Practitioner

## 2024-07-04 DIAGNOSIS — M79605 Pain in left leg: Secondary | ICD-10-CM

## 2024-07-04 DIAGNOSIS — M79661 Pain in right lower leg: Secondary | ICD-10-CM

## 2024-07-04 DIAGNOSIS — M79641 Pain in right hand: Secondary | ICD-10-CM

## 2024-07-05 ENCOUNTER — Ambulatory Visit: Payer: Self-pay | Admitting: Nurse Practitioner

## 2024-07-05 NOTE — Telephone Encounter (Signed)
 Please advise [facility]

## 2024-08-06 ENCOUNTER — Encounter: Payer: Self-pay | Admitting: Nurse Practitioner
